# Patient Record
Sex: Female | Born: 1955 | Race: White | Hispanic: No | Marital: Single | Smoking: Former smoker
Health system: Southern US, Community
[De-identification: ages and names within clinical notes are randomized; demographics above are authoritative.]

## PROBLEM LIST (undated history)

## (undated) ENCOUNTER — Ambulatory Visit: Admission: EM | Payer: 59

## (undated) DIAGNOSIS — N6001 Solitary cyst of right breast: Secondary | ICD-10-CM

## (undated) DIAGNOSIS — J439 Emphysema, unspecified: Secondary | ICD-10-CM

## (undated) DIAGNOSIS — T4145XA Adverse effect of unspecified anesthetic, initial encounter: Secondary | ICD-10-CM

## (undated) DIAGNOSIS — Z923 Personal history of irradiation: Secondary | ICD-10-CM

## (undated) DIAGNOSIS — J45909 Unspecified asthma, uncomplicated: Secondary | ICD-10-CM

## (undated) DIAGNOSIS — I10 Essential (primary) hypertension: Secondary | ICD-10-CM

## (undated) DIAGNOSIS — K649 Unspecified hemorrhoids: Secondary | ICD-10-CM

## (undated) DIAGNOSIS — R519 Headache, unspecified: Secondary | ICD-10-CM

## (undated) DIAGNOSIS — N6002 Solitary cyst of left breast: Secondary | ICD-10-CM

## (undated) DIAGNOSIS — J449 Chronic obstructive pulmonary disease, unspecified: Secondary | ICD-10-CM

## (undated) DIAGNOSIS — T8859XA Other complications of anesthesia, initial encounter: Secondary | ICD-10-CM

## (undated) DIAGNOSIS — C801 Malignant (primary) neoplasm, unspecified: Secondary | ICD-10-CM

## (undated) DIAGNOSIS — M1990 Unspecified osteoarthritis, unspecified site: Secondary | ICD-10-CM

## (undated) DIAGNOSIS — K589 Irritable bowel syndrome without diarrhea: Secondary | ICD-10-CM

## (undated) DIAGNOSIS — K219 Gastro-esophageal reflux disease without esophagitis: Secondary | ICD-10-CM

## (undated) HISTORY — PX: APPENDECTOMY: SHX54

## (undated) HISTORY — PX: OTHER SURGICAL HISTORY: SHX169

## (undated) HISTORY — DX: Irritable bowel syndrome, unspecified: K58.9

## (undated) HISTORY — PX: KNEE SURGERY: SHX244

## (undated) HISTORY — DX: Unspecified hemorrhoids: K64.9

## (undated) HISTORY — DX: Solitary cyst of left breast: N60.02

## (undated) HISTORY — DX: Solitary cyst of right breast: N60.01

## (undated) HISTORY — DX: Malignant (primary) neoplasm, unspecified: C80.1

## (undated) HISTORY — PX: BREAST SURGERY: SHX581

## (undated) HISTORY — DX: Unspecified osteoarthritis, unspecified site: M19.90

---

## 1989-03-13 HISTORY — PX: OTHER SURGICAL HISTORY: SHX169

## 2004-02-22 ENCOUNTER — Inpatient Hospital Stay (HOSPITAL_COMMUNITY): Admission: AD | Admit: 2004-02-22 | Discharge: 2004-02-22 | Payer: Self-pay | Admitting: Obstetrics & Gynecology

## 2008-08-17 ENCOUNTER — Emergency Department: Payer: Self-pay

## 2010-01-27 ENCOUNTER — Emergency Department: Payer: Self-pay | Admitting: Internal Medicine

## 2010-03-10 ENCOUNTER — Ambulatory Visit: Payer: Self-pay

## 2010-04-30 ENCOUNTER — Ambulatory Visit: Payer: Self-pay | Admitting: Orthopedic Surgery

## 2011-08-27 ENCOUNTER — Emergency Department: Payer: Self-pay | Admitting: Unknown Physician Specialty

## 2012-08-07 ENCOUNTER — Emergency Department: Payer: Self-pay | Admitting: Emergency Medicine

## 2012-08-07 LAB — URINALYSIS, COMPLETE
Bilirubin,UR: NEGATIVE
Glucose,UR: NEGATIVE mg/dL (ref 0–75)
Leukocyte Esterase: NEGATIVE
Nitrite: NEGATIVE
Protein: NEGATIVE
RBC,UR: 1 /HPF (ref 0–5)
WBC UR: <1 /HPF (ref 0–5)

## 2012-08-07 LAB — COMPREHENSIVE METABOLIC PANEL
Alkaline Phosphatase: 114 U/L (ref 50–136)
Bilirubin,Total: 0.5 mg/dL (ref 0.2–1.0)
Calcium, Total: 7.9 mg/dL — ABNORMAL LOW (ref 8.5–10.1)
Chloride: 103 mmol/L (ref 98–107)
Co2: 31 mmol/L (ref 21–32)
Creatinine: 0.66 mg/dL (ref 0.60–1.30)
EGFR (African American): >60
EGFR (Non-African Amer.): >60
Osmolality: 273 (ref 275–301)
Potassium: 3.1 mmol/L — ABNORMAL LOW (ref 3.5–5.1)
SGOT(AST): 42 U/L — ABNORMAL HIGH (ref 15–37)
Sodium: 138 mmol/L (ref 136–145)

## 2012-08-07 LAB — CBC
MCHC: 34.6 g/dL (ref 32.0–36.0)
MCV: 91 fL (ref 80–100)
Platelet: 247 10*3/uL (ref 150–440)
RDW: 13.2 % (ref 11.5–14.5)
WBC: 6.3 10*3/uL (ref 3.6–11.0)

## 2012-08-07 LAB — TROPONIN I: Troponin-I: <0.02 ng/mL

## 2012-09-19 ENCOUNTER — Ambulatory Visit: Payer: Self-pay | Admitting: Family Medicine

## 2013-12-27 ENCOUNTER — Emergency Department: Payer: Self-pay | Admitting: Emergency Medicine

## 2014-03-02 ENCOUNTER — Emergency Department: Payer: Self-pay | Admitting: Emergency Medicine

## 2014-06-09 ENCOUNTER — Emergency Department: Payer: Self-pay | Admitting: Emergency Medicine

## 2014-06-09 LAB — TROPONIN I: Troponin-I: <0.02 ng/mL

## 2014-06-09 LAB — BASIC METABOLIC PANEL
Anion Gap: 7 (ref 7–16)
BUN: 4 mg/dL — AB (ref 7–18)
CALCIUM: 8.6 mg/dL (ref 8.5–10.1)
CHLORIDE: 105 mmol/L (ref 98–107)
Co2: 28 mmol/L (ref 21–32)
Creatinine: 0.77 mg/dL (ref 0.60–1.30)
Glucose: 85 mg/dL (ref 65–99)
Osmolality: 276 (ref 275–301)
Potassium: 3.3 mmol/L — ABNORMAL LOW (ref 3.5–5.1)
SODIUM: 140 mmol/L (ref 136–145)

## 2014-06-09 LAB — CBC
HCT: 43.6 % (ref 35.0–47.0)
HGB: 14.1 g/dL (ref 12.0–16.0)
MCH: 29.8 pg (ref 26.0–34.0)
MCHC: 32.4 g/dL (ref 32.0–36.0)
MCV: 92 fL (ref 80–100)
Platelet: 357 10*3/uL (ref 150–440)
RBC: 4.73 10*6/uL (ref 3.80–5.20)
RDW: 13.4 % (ref 11.5–14.5)
WBC: 8.3 10*3/uL (ref 3.6–11.0)

## 2014-06-19 ENCOUNTER — Emergency Department: Payer: Self-pay | Admitting: Emergency Medicine

## 2014-06-19 LAB — BASIC METABOLIC PANEL
ANION GAP: 8 (ref 7–16)
BUN: 4 mg/dL — ABNORMAL LOW (ref 7–18)
CHLORIDE: 104 mmol/L (ref 98–107)
CO2: 27 mmol/L (ref 21–32)
Calcium, Total: 8.2 mg/dL — ABNORMAL LOW (ref 8.5–10.1)
Creatinine: 0.73 mg/dL (ref 0.60–1.30)
Glucose: 99 mg/dL (ref 65–99)
Osmolality: 274 (ref 275–301)
Potassium: 3.1 mmol/L — ABNORMAL LOW (ref 3.5–5.1)
Sodium: 139 mmol/L (ref 136–145)

## 2014-06-19 LAB — CBC
HCT: 40.3 % (ref 35.0–47.0)
HGB: 13 g/dL (ref 12.0–16.0)
MCH: 29.7 pg (ref 26.0–34.0)
MCHC: 32.2 g/dL (ref 32.0–36.0)
MCV: 92 fL (ref 80–100)
PLATELETS: 342 10*3/uL (ref 150–440)
RBC: 4.37 10*6/uL (ref 3.80–5.20)
RDW: 13.3 % (ref 11.5–14.5)
WBC: 10 10*3/uL (ref 3.6–11.0)

## 2014-06-19 LAB — TROPONIN I: Troponin-I: <0.02 ng/mL

## 2014-09-13 DIAGNOSIS — Z923 Personal history of irradiation: Secondary | ICD-10-CM

## 2014-09-13 HISTORY — DX: Personal history of irradiation: Z92.3

## 2014-12-30 ENCOUNTER — Encounter: Payer: Self-pay | Admitting: General Surgery

## 2014-12-30 ENCOUNTER — Ambulatory Visit (INDEPENDENT_AMBULATORY_CARE_PROVIDER_SITE_OTHER): Payer: BLUE CROSS/BLUE SHIELD | Admitting: General Surgery

## 2014-12-30 VITALS — BP 130/78 | HR 76 | Resp 14 | Ht 67.0 in | Wt 191.0 lb

## 2014-12-30 DIAGNOSIS — N63 Unspecified lump in breast: Secondary | ICD-10-CM | POA: Diagnosis not present

## 2014-12-30 DIAGNOSIS — N6310 Unspecified lump in the right breast, unspecified quadrant: Secondary | ICD-10-CM

## 2014-12-30 NOTE — Progress Notes (Signed)
Patient ID: Meagan York, female   DOB: [birth year], 59 y.o.   MRN: [id number]  Chief Complaint  Patient presents with  . Other    right breast mass    HPI Meagan York is a 59 y.o. female here for evaluation of a right breast mass. She states the mass has been there since 1998. It has got bigger over the years. She had a left breast mass removed in 1998. Last mammogram 1998. No family history of breast cancer.  HPI  Past Medical History  Diagnosis Date  . Bilateral breast cysts   . Hemorrhoids   . Irritable bowel disease   . Arthritis     Past Surgical History  Procedure Laterality Date  . Appendectomy    . Knee surgery    . Cyst lower spine    . Breast surgery Left     cyst removal  . Piondial cyst excision  03/1989    Family History  Problem Relation Age of Onset  . Colon cancer Father     age 71    Social History History  Substance Use Topics  . Smoking status: Current Every Day Smoker -- 5.00 packs/day for 40 years    Types: Cigarettes  . Smokeless tobacco: Never Used  . Alcohol Use: No    Allergies  Allergen Reactions  . Tramadol Nausea And Vomiting    Current Outpatient Prescriptions  Medication Sig Dispense Refill  . albuterol (PROVENTIL HFA;VENTOLIN HFA) 108 (90 BASE) MCG/ACT inhaler Inhale into the lungs.     No current facility-administered medications for this visit.    Review of Systems Review of Systems  Constitutional: Negative.   Respiratory: Negative.   Cardiovascular: Negative.     Blood pressure 130/78, pulse 76, resp. rate 14, height 5\' 7"  (1.702 m), weight 191 lb (86.637 kg).  Physical Exam Physical Exam  Constitutional: She is oriented to person, place, and time. She appears well-developed and well-nourished.  Eyes: Conjunctivae are normal. No scleral icterus.  Neck: Neck supple.  Cardiovascular: Normal rate, regular rhythm and normal heart sounds.   Pulmonary/Chest: Effort normal and breath sounds normal. Right breast  exhibits mass (6-7 cm firm to hard smooth mass 8-9ocl lcation). Right breast exhibits no inverted nipple, no nipple discharge, no skin change and no tenderness. Left breast exhibits no inverted nipple, no mass, no nipple discharge, no skin change and no tenderness.    Solid mass noted on ultrasound in the outer right breast, approximate size is 4 x 4.   Abdominal: Soft. There is no hepatomegaly.  Lymphadenopathy:    She has no cervical adenopathy.    She has no axillary adenopathy.  Neurological: She is alert and oriented to person, place, and time.    Data Reviewed Dr. Allie Bossier notes  Assessment    Large right breast mass    Plan    Patient to return on Wednesday for a right breast core biopsy.     PCP: Dr [name] 12/31/2014, 6:44 AM

## 2014-12-31 ENCOUNTER — Encounter: Payer: Self-pay | Admitting: General Surgery

## 2015-01-01 ENCOUNTER — Encounter: Payer: Self-pay | Admitting: General Surgery

## 2015-01-01 ENCOUNTER — Ambulatory Visit (INDEPENDENT_AMBULATORY_CARE_PROVIDER_SITE_OTHER): Payer: BLUE CROSS/BLUE SHIELD | Admitting: General Surgery

## 2015-01-01 VITALS — BP 110/80 | HR 94 | Resp 16 | Ht 67.0 in | Wt 191.0 lb

## 2015-01-01 DIAGNOSIS — N63 Unspecified lump in breast: Secondary | ICD-10-CM

## 2015-01-01 DIAGNOSIS — N6310 Unspecified lump in the right breast, unspecified quadrant: Secondary | ICD-10-CM

## 2015-01-01 HISTORY — PX: BREAST BIOPSY: SHX20

## 2015-01-01 NOTE — Patient Instructions (Signed)

## 2015-01-01 NOTE — Progress Notes (Signed)
Patient ID: Meagan York, female   DOB: 1956/08/01, 59 y.o.   MRN: [id number]  Chief Complaint  Patient presents with  . Procedure    right breast core'    HPI Meagan York is a 59 y.o. female here today for a right breast core biopsy.  HPI  Past Medical History  Diagnosis Date  . Bilateral breast cysts   . Hemorrhoids   . Irritable bowel disease   . Arthritis     Past Surgical History  Procedure Laterality Date  . Appendectomy    . Knee surgery    . Cyst lower spine    . Breast surgery Left     cyst removal  . Piondial cyst excision  03/1989    Family History  Problem Relation Age of Onset  . Colon cancer Father     age 64    Social History History  Substance Use Topics  . Smoking status: Current Every Day Smoker -- 5.00 packs/day for 40 years    Types: Cigarettes  . Smokeless tobacco: Never Used  . Alcohol Use: No    Allergies  Allergen Reactions  . Tramadol Nausea And Vomiting    Current Outpatient Prescriptions  Medication Sig Dispense Refill  . albuterol (PROVENTIL HFA;VENTOLIN HFA) 108 (90 BASE) MCG/ACT inhaler Inhale into the lungs.     No current facility-administered medications for this visit.    Review of Systems Review of Systems  Constitutional: Negative.   Respiratory: Negative.   Cardiovascular: Negative.     Blood pressure 110/80, pulse 94, resp. rate 16, height 5\' 7"  (1.702 m), weight 191 lb (86.637 kg).  Physical Exam Physical Exam  Data Reviewed Right breast US targeted over palpable mass at 8 ocl 5cmfn was performed. There is a 4cm solid mass with a mixed echogenic pattern. Margins are smooth. Mild lobulatin in one end.  Core biopsy recommended. Also needs diagnostic mammogram.   BIRADS 4  Assessment    Large right breast mass-possible benign neoplasm.      Plan    Core biopsy completed  CORE BREAST BIOPSY REPORT  Name:  Meagan York DOB:  1956-05-03  Vital signs:BP 110/80 mmHg  Pulse 94  Resp 16   Ht 5\' 7"  (1.702 m)  Wt 191 lb (86.637 kg)  BMI 29.91 kg/m2  Lesion:  mass  Location:   Right      8o'clock position  Local anesthetic:   8 ml of 1% Xylocaine/0.5% Marcaine  Prep:  Chloro Prep  Device:  14 Gauge   Bard   Ultrasound guidance:  used  Patient tolerance:  Patient tolerated the procedure well   Approach: cc  Clip: not placed  Dressing: telfa, tegderm  Ice pack applied. Written instructions provided to patient regarding wound care.   Patient advised that patient will be contacted by phone when pathology report is available.  Followup appointment to be scheduled after pathology.   CC: No PCP Per Patient, [name]      Jaculin Rasmus G 01/01/2015, 9:30 AM

## 2015-01-02 ENCOUNTER — Telehealth: Payer: Self-pay | Admitting: *Deleted

## 2015-01-02 NOTE — Telephone Encounter (Signed)
Patient scheduled for bilateral diagnostic mammogram at [facility] for 01-03-15 at 3 pm.  Office appointment scheduled for 01-13-15.

## 2015-01-02 NOTE — Telephone Encounter (Signed)
-----   Message from Christene Lye, MD sent at 01/02/2015  2:48 PM EDT ----- Path showed a fibroepithelial lesion, with some ADH. Needs full excision but not before a diagnostic mammogram. Pt advised by phone. She is agreeable to plan.

## 2015-01-03 ENCOUNTER — Other Ambulatory Visit: Payer: BLUE CROSS/BLUE SHIELD

## 2015-01-03 ENCOUNTER — Other Ambulatory Visit: Payer: Self-pay | Admitting: General Surgery

## 2015-01-03 DIAGNOSIS — N6310 Unspecified lump in the right breast, unspecified quadrant: Secondary | ICD-10-CM

## 2015-01-08 ENCOUNTER — Encounter: Payer: Self-pay | Admitting: General Surgery

## 2015-01-13 ENCOUNTER — Ambulatory Visit: Payer: BLUE CROSS/BLUE SHIELD | Admitting: General Surgery

## 2015-01-21 ENCOUNTER — Ambulatory Visit: Payer: BLUE CROSS/BLUE SHIELD | Admitting: General Surgery

## 2015-01-28 ENCOUNTER — Telehealth: Payer: Self-pay | Admitting: *Deleted

## 2015-01-28 NOTE — Telephone Encounter (Deleted)
-----   Message from Christene Lye, MD sent at 01/28/2015  7:03 AM EDT ----- Please let pt pt know the pathology was normal.One of the three was a true polyp, no cancer in it. He will need colonoscopy in 5 yrs

## 2015-01-28 NOTE — Telephone Encounter (Signed)
Patient was calling you back and the message in there about the pathology results are not [name] pathology, She has never had a colonoscopy.

## 2015-02-06 ENCOUNTER — Ambulatory Visit: Payer: BLUE CROSS/BLUE SHIELD | Admitting: General Surgery

## 2015-04-10 ENCOUNTER — Ambulatory Visit (INDEPENDENT_AMBULATORY_CARE_PROVIDER_SITE_OTHER): Payer: Managed Care, Other (non HMO) | Admitting: General Surgery

## 2015-04-10 ENCOUNTER — Encounter: Payer: Self-pay | Admitting: General Surgery

## 2015-04-10 ENCOUNTER — Other Ambulatory Visit: Payer: Self-pay

## 2015-04-10 VITALS — BP 132/88 | HR 82 | Resp 14 | Ht 65.0 in | Wt 188.0 lb

## 2015-04-10 DIAGNOSIS — N63 Unspecified lump in breast: Secondary | ICD-10-CM

## 2015-04-10 DIAGNOSIS — D241 Benign neoplasm of right breast: Secondary | ICD-10-CM | POA: Diagnosis not present

## 2015-04-10 DIAGNOSIS — N6310 Unspecified lump in the right breast, unspecified quadrant: Secondary | ICD-10-CM

## 2015-04-10 DIAGNOSIS — N62 Hypertrophy of breast: Secondary | ICD-10-CM | POA: Diagnosis not present

## 2015-04-10 DIAGNOSIS — N6091 Unspecified benign mammary dysplasia of right breast: Secondary | ICD-10-CM

## 2015-04-10 NOTE — Progress Notes (Signed)
Patient ID: Meagan York, female   DOB: [birth year], 59 y.o.   MRN: [id number]  Chief Complaint  Patient presents with  . Follow-up    mammogram    HPI Meagan York is a 59 y.o. female.  who presents for a breast evaluation. The most recent mammogram was done on 01-03-15.  Patient does perform regular self breast checks and gets regular mammograms done.   No family history of breast cancer. She had a right breast biopsy in April and she is here to discuss excision of the right breast mass, she now has insurance. 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   HPI  Past Medical History  Diagnosis Date  . Bilateral breast cysts   . Hemorrhoids   . Irritable bowel disease   . Arthritis     Past Surgical History  Procedure Laterality Date  . Appendectomy    . Knee surgery    . Cyst lower spine    . Piondial cyst excision  03/1989  . Breast surgery Left     cyst removal  . Breast biopsy Right 01-01-15    BIPHASIC FIBROEPITHELIAL LESION with ADH    Family History  Problem Relation Age of Onset  . Colon cancer Father     age 59    Social History History  Substance Use Topics  . Smoking status: Current Every Day Smoker -- 5.00 packs/day for 40 years    Types: Cigarettes  . Smokeless tobacco: Never Used  . Alcohol Use: No    Allergies  Allergen Reactions  . Tramadol Nausea And Vomiting    Current Outpatient Prescriptions  Medication Sig Dispense Refill  . albuterol (PROVENTIL HFA;VENTOLIN HFA) 108 (90 BASE) MCG/ACT inhaler Inhale into the lungs every 4 (four) hours as needed.      No current facility-administered medications for this visit.    Review of Systems Review of Systems  Constitutional: Negative.   Respiratory: Negative.   Cardiovascular: Negative.   Neurological: Positive for headaches.    Blood pressure 132/88, pulse 82, resp. rate 14, height 5\' 5"  (1.651 m), weight 188 lb (85.276 kg).  Physical Exam Physical Exam  Constitutional: She  is oriented to person, place, and time. She appears well-developed and well-nourished.  HENT:  Mouth/Throat: Oropharynx is clear and moist.  Eyes: Conjunctivae are normal. No scleral icterus.  Neck: Neck supple.  Cardiovascular: Normal rate, regular rhythm and normal heart sounds.   Pulmonary/Chest: Effort normal and breath sounds normal. Right breast exhibits mass. Right breast exhibits no inverted nipple, no nipple discharge, no skin change and no tenderness. Left breast exhibits no inverted nipple, no mass, no nipple discharge, no skin change and no tenderness.    6 cm firm mass right breast 8-9 o clock.   Abdominal: Soft. Bowel sounds are normal. There is no tenderness.  Lymphadenopathy:    She has no cervical adenopathy.    She has no axillary adenopathy.  Neurological: She is alert and oriented to person, place, and time.  Skin: Skin is warm and dry.  Psychiatric: She has a normal mood and affect.    Data Reviewed Mammogram reviewed and showed sub centimeter benign appearing nodules in medial right breast in addition to the large mass in the lateral aspect.  [place] of right breast was repeated. There is an approximate 5 mm benign appearing nodule 2 oclock right breast. The lateral right breast was hard to visualize because of the large mass present there. The right axilla has a  benign appearing lymph node.   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 This requires complete excision in the form of lumpectomy. Discussed fully with patient.      Plan    Schedule lumpectomy. Procedure explained in full. Patient is agreeable.  Patient's surgery has been scheduled for 04-25-15 at [facility].      PCP:  No Pcp   Janee Ureste G 04/10/2015, 2:02 PM

## 2015-04-10 NOTE — Patient Instructions (Addendum)
The patient is aware to call back for any questions or concerns.  Patient's surgery has been scheduled for 04-25-15 at [facility].

## 2015-04-18 ENCOUNTER — Inpatient Hospital Stay: Admission: RE | Admit: 2015-04-18 | Payer: Self-pay | Source: Ambulatory Visit

## 2015-04-18 ENCOUNTER — Encounter: Payer: Self-pay | Admitting: *Deleted

## 2015-04-18 NOTE — Patient Instructions (Signed)
  Your procedure is scheduled on: 04-25-15 Report to [facility] To find out your arrival time please call [phone] between 1PM - 3PM on 04-24-15  Remember: Instructions that are not followed completely may result in serious medical risk, up to and including death, or upon the discretion of your surgeon and anesthesiologist your surgery may need to be rescheduled.    __X__ 1. Do not eat food or drink liquids after midnight. No gum chewing or hard candies.     __X__ 2. No Alcohol for 24 hours before or after surgery.   ____ 3. Bring all medications with you on the day of surgery if instructed.    ____ 4. Notify your doctor if there is any change in your medical condition     (cold, fever, infections).     Do not wear jewelry, make-up, hairpins, clips or nail polish.  Do not wear lotions, powders, or perfumes. You may wear deodorant.  Do not shave 48 hours prior to surgery. Men may shave face and neck.  Do not bring valuables to the hospital.    [facility] is not responsible for any belongings or valuables.               Contacts, dentures or bridgework may not be worn into surgery.  Leave your suitcase in the car. After surgery it may be brought to your room.  For patients admitted to the hospital, discharge time is determined by your  treatment team.   Patients discharged the day of surgery will not be allowed to drive home.   Please read over the following fact sheets that you were given:     __X__ Take these medicines the morning of surgery with A SIP OF WATER:    1. ZANTAC  2. TAKE AN EXTRA ZANTAC Thursday NIGHT  3.   4.  5.  6.  ____ Fleet Enema (as directed)   ____ Use CHG Soap as directed  _X___ Use inhalers on the day of surgery-USE ALBUTEROL AND [place]  ____ Stop metformin 2 days prior to surgery    ____ Take 1/2 of usual insulin dose the night before surgery and none on the morning of surgery.   ____ Stop  Coumadin/Plavix/aspirin-N/A  ____ Stop Anti-inflammatories-NO NSAIDS OR ASA PRODUCTS-TYLENOL OK   ____ Stop supplements until after surgery.    ____ Bring C-Pap to the hospital.

## 2015-04-25 ENCOUNTER — Ambulatory Visit: Payer: Managed Care, Other (non HMO) | Admitting: Anesthesiology

## 2015-04-25 ENCOUNTER — Ambulatory Visit
Admission: RE | Admit: 2015-04-25 | Discharge: 2015-04-25 | Disposition: A | Discharge status: Home / Self Care | Payer: Managed Care, Other (non HMO) | Source: Ambulatory Visit | Attending: General Surgery | Admitting: General Surgery

## 2015-04-25 ENCOUNTER — Encounter: Payer: Self-pay | Admitting: *Deleted

## 2015-04-25 ENCOUNTER — Encounter
Admission: RE | Disposition: A | Discharge status: Home / Self Care | Payer: Self-pay | Source: Ambulatory Visit | Attending: General Surgery

## 2015-04-25 DIAGNOSIS — F17210 Nicotine dependence, cigarettes, uncomplicated: Secondary | ICD-10-CM | POA: Insufficient documentation

## 2015-04-25 DIAGNOSIS — M1990 Unspecified osteoarthritis, unspecified site: Secondary | ICD-10-CM | POA: Diagnosis not present

## 2015-04-25 DIAGNOSIS — N6091 Unspecified benign mammary dysplasia of right breast: Secondary | ICD-10-CM

## 2015-04-25 DIAGNOSIS — D241 Benign neoplasm of right breast: Secondary | ICD-10-CM | POA: Diagnosis present

## 2015-04-25 DIAGNOSIS — K589 Irritable bowel syndrome without diarrhea: Secondary | ICD-10-CM | POA: Diagnosis not present

## 2015-04-25 DIAGNOSIS — Z800 Family history of malignant neoplasm of digestive organs: Secondary | ICD-10-CM | POA: Insufficient documentation

## 2015-04-25 DIAGNOSIS — Z853 Personal history of malignant neoplasm of breast: Secondary | ICD-10-CM | POA: Insufficient documentation

## 2015-04-25 DIAGNOSIS — L918 Other hypertrophic disorders of the skin: Secondary | ICD-10-CM | POA: Insufficient documentation

## 2015-04-25 DIAGNOSIS — Z9049 Acquired absence of other specified parts of digestive tract: Secondary | ICD-10-CM | POA: Insufficient documentation

## 2015-04-25 DIAGNOSIS — D0511 Intraductal carcinoma in situ of right breast: Secondary | ICD-10-CM | POA: Diagnosis not present

## 2015-04-25 DIAGNOSIS — Z9889 Other specified postprocedural states: Secondary | ICD-10-CM | POA: Insufficient documentation

## 2015-04-25 DIAGNOSIS — C801 Malignant (primary) neoplasm, unspecified: Secondary | ICD-10-CM | POA: Insufficient documentation

## 2015-04-25 DIAGNOSIS — D4861 Neoplasm of uncertain behavior of right breast: Secondary | ICD-10-CM | POA: Diagnosis not present

## 2015-04-25 HISTORY — DX: Adverse effect of unspecified anesthetic, initial encounter: T41.45XA

## 2015-04-25 HISTORY — DX: Gastro-esophageal reflux disease without esophagitis: K21.9

## 2015-04-25 HISTORY — PX: BREAST LUMPECTOMY: SHX2

## 2015-04-25 HISTORY — DX: Chronic obstructive pulmonary disease, unspecified: J44.9

## 2015-04-25 HISTORY — DX: Other complications of anesthesia, initial encounter: T88.59XA

## 2015-04-25 HISTORY — DX: Malignant (primary) neoplasm, unspecified: C80.1

## 2015-04-25 SURGERY — BREAST LUMPECTOMY
Anesthesia: General | Laterality: Right | Wound class: Clean

## 2015-04-25 MED ORDER — LACTATED RINGERS IV SOLN
INTRAVENOUS | Status: DC
  Administered 2015-04-25 (×2): via INTRAVENOUS

## 2015-04-25 MED ORDER — ONDANSETRON HCL 4 MG/2ML IJ SOLN
INTRAMUSCULAR | Status: DC | PRN
  Administered 2015-04-25: 4 mg via INTRAVENOUS

## 2015-04-25 MED ORDER — FENTANYL CITRATE (PF) 100 MCG/2ML IJ SOLN
INTRAMUSCULAR | Status: AC
  Filled 2015-04-25: qty 2

## 2015-04-25 MED ORDER — CHLORHEXIDINE GLUCONATE 4 % EX LIQD: 1.0000 "application " | Freq: Once | CUTANEOUS | Status: DC

## 2015-04-25 MED ORDER — LIDOCAINE HCL (CARDIAC) 20 MG/ML IV SOLN
INTRAVENOUS | Status: DC | PRN
  Administered 2015-04-25: 100 mg via INTRAVENOUS

## 2015-04-25 MED ORDER — BUPIVACAINE HCL (PF) 0.5 % IJ SOLN
INTRAMUSCULAR | Status: DC | PRN
Start: 2015-04-25 — End: 2015-04-25
  Administered 2015-04-25: 4 mL

## 2015-04-25 MED ORDER — BACITRACIN ZINC 500 UNIT/GM EX OINT
TOPICAL_OINTMENT | CUTANEOUS | Status: AC
  Filled 2015-04-25: qty 0.9

## 2015-04-25 MED ORDER — HYDROMORPHONE HCL 1 MG/ML IJ SOLN
INTRAMUSCULAR | Status: DC | PRN
  Administered 2015-04-25 (×2): 0.5 mg via INTRAVENOUS

## 2015-04-25 MED ORDER — ACETAMINOPHEN 10 MG/ML IV SOLN
INTRAVENOUS | Status: DC | PRN
  Administered 2015-04-25: 1000 mg via INTRAVENOUS

## 2015-04-25 MED ORDER — ONDANSETRON HCL 4 MG/2ML IJ SOLN: 4.0000 mg | Freq: Once | INTRAMUSCULAR | Status: DC | PRN

## 2015-04-25 MED ORDER — BUPIVACAINE HCL (PF) 0.5 % IJ SOLN
INTRAMUSCULAR | Status: AC
  Filled 2015-04-25: qty 30

## 2015-04-25 MED ORDER — FENTANYL CITRATE (PF) 100 MCG/2ML IJ SOLN
25.0000 ug | INTRAMUSCULAR | Status: DC | PRN
  Administered 2015-04-25 (×4): 25 ug via INTRAVENOUS

## 2015-04-25 MED ORDER — FENTANYL CITRATE (PF) 100 MCG/2ML IJ SOLN
INTRAMUSCULAR | Status: DC | PRN
  Administered 2015-04-25 (×2): 50 ug via INTRAVENOUS

## 2015-04-25 MED ORDER — DEXAMETHASONE SODIUM PHOSPHATE 4 MG/ML IJ SOLN
INTRAMUSCULAR | Status: DC | PRN
  Administered 2015-04-25: 30 mg via INTRAVENOUS
  Administered 2015-04-25: 4 mg via INTRAVENOUS

## 2015-04-25 MED ORDER — ACETAMINOPHEN 10 MG/ML IV SOLN
INTRAVENOUS | Status: AC
  Filled 2015-04-25: qty 100

## 2015-04-25 MED ORDER — OXYCODONE-ACETAMINOPHEN 5-325 MG PO TABS
ORAL_TABLET | ORAL | Status: AC
  Filled 2015-04-25: qty 1

## 2015-04-25 MED ORDER — OXYCODONE-ACETAMINOPHEN 5-325 MG PO TABS: 1.0000 | ORAL_TABLET | ORAL | Status: DC | PRN

## 2015-04-25 MED ORDER — PROPOFOL 10 MG/ML IV BOLUS
INTRAVENOUS | Status: DC | PRN
  Administered 2015-04-25: 150 mg via INTRAVENOUS

## 2015-04-25 MED ORDER — MIDAZOLAM HCL 2 MG/2ML IJ SOLN
INTRAMUSCULAR | Status: DC | PRN
  Administered 2015-04-25: 2 mg via INTRAVENOUS

## 2015-04-25 MED ORDER — OXYCODONE-ACETAMINOPHEN 5-325 MG PO TABS
2.0000 | ORAL_TABLET | ORAL | Status: DC | PRN
  Administered 2015-04-25: 1 via ORAL

## 2015-04-25 SURGICAL SUPPLY — 37 items
BLADE SURG 15 STRL SS SAFETY (BLADE) ×3 IMPLANT
BULB RESERV EVAC DRAIN JP 100C (MISCELLANEOUS) ×1 IMPLANT
CANISTER SUCT 1200ML W/VALVE (MISCELLANEOUS) ×2 IMPLANT
CHLORAPREP W/TINT 26ML (MISCELLANEOUS) ×2 IMPLANT
CNTNR SPEC 2.5X3XGRAD LEK (MISCELLANEOUS) ×1
CONT SPEC 4OZ STER OR WHT (MISCELLANEOUS) ×1
CONT SPEC 4OZ STRL OR WHT (MISCELLANEOUS) ×1
CONTAINER SPEC 2.5X3XGRAD LEK (MISCELLANEOUS) ×4 IMPLANT
COVER PROBE FLX POLY STRL (MISCELLANEOUS) ×2 IMPLANT
DEVICE LOCALIZATION ULTRAWIRE (WIRE) ×1 IMPLANT
DRAIN CHANNEL JP 15F RND 16 (MISCELLANEOUS) ×1 IMPLANT
DRAPE LAPAROTOMY TRNSV 106X77 (MISCELLANEOUS) ×2 IMPLANT
GLOVE BIO SURGEON STRL SZ7 (GLOVE) ×4 IMPLANT
GOWN STRL REUS W/ TWL LRG LVL3 (GOWN DISPOSABLE) ×3 IMPLANT
GOWN STRL REUS W/TWL LRG LVL3 (GOWN DISPOSABLE) ×4
HARMONIC SCALPEL FOCUS (MISCELLANEOUS) ×1 IMPLANT
KIT RM TURNOVER STRD PROC AR (KITS) ×2 IMPLANT
LABEL OR SOLS (LABEL) ×2 IMPLANT
LIQUID BAND (GAUZE/BANDAGES/DRESSINGS) ×2 IMPLANT
MARGIN MAP 10MM (MISCELLANEOUS) ×2 IMPLANT
NDL SAFETY 22GX1.5 (NEEDLE) ×2 IMPLANT
NDL SAFETY 25GX1.5 (NEEDLE) ×1 IMPLANT
PACK BASIN MINOR ARMC (MISCELLANEOUS) ×2 IMPLANT
PAD GROUND ADULT SPLIT (MISCELLANEOUS) ×2 IMPLANT
SLEVE PROBE SENORX GAMMA FIND (MISCELLANEOUS) ×1 IMPLANT
STRIP CLOSURE SKIN 1/2X4 (GAUZE/BANDAGES/DRESSINGS) ×1 IMPLANT
SUT ETH BLK MONO 3 0 FS 1 12/B (SUTURE) ×3 IMPLANT
SUT MNCRL 4-0 (SUTURE) ×2
SUT MNCRL 4-0 27XMFL (SUTURE) ×1
SUT MNCRL AB 3-0 PS2 27 (SUTURE) ×1 IMPLANT
SUT VIC AB 2-0 BRD 54 (SUTURE) ×1 IMPLANT
SUT VIC AB 2-0 CT2 27 (SUTURE) ×4 IMPLANT
SUT VIC AB 4-0 FS2 27 (SUTURE) ×1 IMPLANT
SUTURE MNCRL 4-0 27XMF (SUTURE) IMPLANT
SYRINGE 10CC LL (SYRINGE) ×2 IMPLANT
ULTRAWIRE LOCALIZATION DEVICE (WIRE)
WATER STERILE IRR 1000ML POUR (IV SOLUTION) ×2 IMPLANT

## 2015-04-25 NOTE — Anesthesia Postprocedure Evaluation (Signed)
  Anesthesia Post-op Note  Patient: Meagan York  Procedure(s) Performed: Procedure(s): RIGHT BREAST LUMPECTOMY, exicision of skin tag right face (Right)  Anesthesia type:General  Patient location: PACU  Post pain: Pain level controlled  Post assessment: Post-op Vital signs reviewed, Patient's Cardiovascular Status Stable, Respiratory Function Stable, Patent Airway and No signs of Nausea or vomiting  Post vital signs: Reviewed and stable  Last Vitals:  Filed Vitals:   04/25/15 0832  BP: 117/85  Pulse: 91  Temp: 36.3 C  Resp: 23    Level of consciousness: awake, alert  and patient cooperative  Complications: No apparent anesthesia complications

## 2015-04-25 NOTE — Interval H&P Note (Signed)
History and Physical Interval Note:  04/25/2015 7:07 AM  Meagan York  has presented today for surgery, with the diagnosis of BENIGN NEOPLASM R BREAST  The various methods of treatment have been discussed with the patient and family. After consideration of risks, benefits and other options for treatment, the patient has consented to  Procedure(s): BREAST LUMPECTOMY (Right) as a surgical intervention .  The patient's history has been reviewed, patient examined, no change in status, stable for surgery.  I have reviewed the patient's chart and labs.  Questions were answered to the patient's satisfaction.   She also requested that a skin tag on right temple be removed as it gets irritated by her glasses. This is acceptable and this is added to her consent.  SANKAR,SEEPLAPUTHUR G

## 2015-04-25 NOTE — Discharge Instructions (Signed)
AMBULATORY SURGERY  DISCHARGE INSTRUCTIONS   1) The drugs that you were given will stay in your system until tomorrow so for the next 24 hours you should not:  A) Drive an automobile B) Make any legal decisions C) Drink any alcoholic beverage   2) You may resume regular meals tomorrow.  Today it is better to start with liquids and gradually work up to solid foods.  You may eat anything you prefer, but it is better to start with liquids, then soup and crackers, and gradually work up to solid foods.   3) Please notify your doctor immediately if you have any unusual bleeding, trouble breathing, redness and pain at the surgery site, drainage, fever, or pain not relieved by medication.    4) Additional Instructions:    Breast Biopsy  Care After Refer to this sheet in the next few weeks. These instructions provide you with information on caring for yourself after your procedure. Your caregiver may also give you more specific instructions. Your treatment has been planned according to current medical practices, but problems sometimes occur. Call your caregiver if you have any problems or questions after your procedure. HOME CARE INSTRUCTIONS   Only take over-the-counter or prescription medicines for pain, discomfort, or fever as directed by your caregiver.  Do not take aspirin. It can cause bleeding.  Keep stitches dry when bathing.  Protect the biopsy area. Do not let the area get bumped.  Avoid activities that may pull the incision site open until approved by your caregiver. This can include stretching, reaching, exercise, sports, or lifting over 3 pounds.  Resume your usual diet.  Wear a good support bra for as long as directed by your caregiver.  Change any bandages (dressings) as directed by your caregiver.  Do not drink alcohol while taking pain medicine.  Keep all your follow-up appointments with your caregiver. Ask when your test results will be ready. Make sure you get  your test results. SEEK MEDICAL CARE IF:   You have redness, swelling, or increasing pain in the biopsy site.  You have a bad smell coming from the biopsy site or dressing.  Your biopsy site breaks open after the stitches (sutures), staples, or skin adhesive strips have been removed.  You have a rash.  You need stronger medicine. SEEK IMMEDIATE MEDICAL CARE IF:   You have a fever.  You have increased bleeding (more than a small spot) from the biopsy site.  You have difficulty breathing.  You have pus coming from the biopsy site. MAKE SURE YOU:  Understand these instructions.  Will watch your condition.  Will get help right away if you are not doing well or get worse. Document Released: 03/19/2005 Document Revised: 11/22/2011 Document Reviewed: 09/30/2011 [facility] Patient Information 2015 [place], [place]. This information is not intended to replace advice given to you by your health care provider. Make sure you discuss any questions you have with your health care provider.     Please contact your physician with any problems or Same Day Surgery at [phone], Monday through Friday 6 am to 4 pm, or [place] [facility] number at [phone].

## 2015-04-25 NOTE — H&P (View-Only) (Signed)
Patient ID: Meagan York, female   DOB: [birth year], 59 y.o.   MRN: [id number]  Chief Complaint  Patient presents with  . Follow-up    mammogram    HPI Meagan York is a 58 y.o. female.  who presents for a breast evaluation. The most recent mammogram was done on 01-03-15.  Patient does perform regular self breast checks and gets regular mammograms done.   No family history of breast cancer. She had a right breast biopsy in April and she is here to discuss excision of the right breast mass, she now has insurance. 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   HPI  Past Medical History  Diagnosis Date  . Bilateral breast cysts   . Hemorrhoids   . Irritable bowel disease   . Arthritis     Past Surgical History  Procedure Laterality Date  . Appendectomy    . Knee surgery    . Cyst lower spine    . Piondial cyst excision  03/1989  . Breast surgery Left     cyst removal  . Breast biopsy Right 01-01-15    BIPHASIC FIBROEPITHELIAL LESION with ADH    Family History  Problem Relation Age of Onset  . Colon cancer Father     age 62    Social History History  Substance Use Topics  . Smoking status: Current Every Day Smoker -- 5.00 packs/day for 40 years    Types: Cigarettes  . Smokeless tobacco: Never Used  . Alcohol Use: No    Allergies  Allergen Reactions  . Tramadol Nausea And Vomiting    Current Outpatient Prescriptions  Medication Sig Dispense Refill  . albuterol (PROVENTIL HFA;VENTOLIN HFA) 108 (90 BASE) MCG/ACT inhaler Inhale into the lungs every 4 (four) hours as needed.      No current facility-administered medications for this visit.    Review of Systems Review of Systems  Constitutional: Negative.   Respiratory: Negative.   Cardiovascular: Negative.   Neurological: Positive for headaches.    Blood pressure 132/88, pulse 82, resp. rate 14, height 5\' 5"  (1.651 m), weight 188 lb (85.276 kg).  Physical Exam Physical Exam  Constitutional: She  is oriented to person, place, and time. She appears well-developed and well-nourished.  HENT:  Mouth/Throat: Oropharynx is clear and moist.  Eyes: Conjunctivae are normal. No scleral icterus.  Neck: Neck supple.  Cardiovascular: Normal rate, regular rhythm and normal heart sounds.   Pulmonary/Chest: Effort normal and breath sounds normal. Right breast exhibits mass. Right breast exhibits no inverted nipple, no nipple discharge, no skin change and no tenderness. Left breast exhibits no inverted nipple, no mass, no nipple discharge, no skin change and no tenderness.    6 cm firm mass right breast 8-9 o clock.   Abdominal: Soft. Bowel sounds are normal. There is no tenderness.  Lymphadenopathy:    She has no cervical adenopathy.    She has no axillary adenopathy.  Neurological: She is alert and oriented to person, place, and time.  Skin: Skin is warm and dry.  Psychiatric: She has a normal mood and affect.    Data Reviewed Mammogram reviewed and showed sub centimeter benign appearing nodules in medial right breast in addition to the large mass in the lateral aspect.  [place] of right breast was repeated. There is an approximate 5 mm benign appearing nodule 2 oclock right breast. The lateral right breast was hard to visualize because of the large mass present there. The right axilla has a  benign appearing lymph node.   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 This requires complete excision in the form of lumpectomy. Discussed fully with patient.      Plan    Schedule lumpectomy. Procedure explained in full. Patient is agreeable.  Patient's surgery has been scheduled for 04-25-15 at [facility].      PCP:  No Pcp   Gennie Dib G 04/10/2015, 2:02 PM

## 2015-04-25 NOTE — Anesthesia Procedure Notes (Signed)
Procedure Name: LMA Insertion Date/Time: 04/25/2015 7:22 AM Performed by: Justus Memory Pre-anesthesia Checklist: Patient identified, Emergency Drugs available, Suction available and Patient being monitored Patient Re-evaluated:Patient Re-evaluated prior to inductionOxygen Delivery Method: Circle system utilized Preoxygenation: Pre-oxygenation with 100% oxygen Intubation Type: IV induction Ventilation: Mask ventilation without difficulty LMA: LMA inserted LMA Size: 3.5 Number of attempts: 1

## 2015-04-25 NOTE — Op Note (Signed)
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 Removal skin tag right temple  Surgeon: S.G.Edrian Melucci  Assistant:     Anesthesia: Gen.  Complications: None  EBL: Minimal  Drains: None  Description: Patient was put to sleep in the supine position the operating table. The right breast was prepped and draped sterile field. Timeout was performed. Patient had a large palpable mass at the 8:00 location right breast. Previous core biopsy had revealed a fibroepithelial neoplasm with evidence of ADH. Curvilinear incision was made along the 8:00 location. The skin and subcutaneous tissue with an elevated on both sides to allow for adequate exposure of this large mass. With careful traction and exposure the mass was circumferentially freed with cautery and subsequently excised out with a rim of normal tissue especially at the base and on the sides. The mass measured 9 x 7 x 5 cm. This was tagged for margins and sent to pathology in formalin. Palpation of the glandular tissue showed no other palpable findings. After ensuring hemostasis the deep tissues were closed in 2 layers with 2-0 Vicryl. Skin was closed with subcuticular 4-0 Monocryl. Liqui  ban was applied. The skin tag area in the right temple was prepped with Betadine and skin tag was excised out flush with the skin and cauterized. This was dressed with Neosporin ointment and a Band-Aid. Patient subsequently was extubated and returned recovery room stable condition

## 2015-04-25 NOTE — Transfer of Care (Signed)
Immediate Anesthesia Transfer of Care Note  Patient: Meagan York  Procedure(s) Performed: Procedure(s): RIGHT BREAST LUMPECTOMY, exicision of skin tag right face (Right)  Patient Location: PACU  Anesthesia Type:General  Level of Consciousness: awake, alert  and oriented  Airway & Oxygen Therapy: Patient Spontanous Breathing and Patient connected to face mask oxygen  Post-op Assessment: Report given to RN and Post -op Vital signs reviewed and stable  Post vital signs: Reviewed and stable  Last Vitals:  Filed Vitals:   04/25/15 0604  BP: 121/85  Pulse: 95  Temp: 36.6 C  Resp: 16    Complications: No apparent anesthesia complications

## 2015-04-25 NOTE — Anesthesia Preprocedure Evaluation (Signed)
Anesthesia Evaluation  Patient identified by MRN, date of birth, ID band Patient awake    Reviewed: Allergy & Precautions, NPO status , Patient's Chart, lab work & pertinent test results  History of Anesthesia Complications Negative for: history of anesthetic complications (Awareness under anesthesia with breast bx)  Airway Mallampati: II  TM Distance: >3 FB Neck ROM: Full    Dental  (+) Teeth Intact   Pulmonary COPD (no inhalers for several months) COPD inhaler, Current Smoker (1ppd),          Cardiovascular     Neuro/Psych    GI/Hepatic GERD-  Medicated and Controlled,  Endo/Other    Renal/GU Renal disease (recurrent UTIs)     Musculoskeletal  (+) Arthritis -, Osteoarthritis,    Abdominal   Peds  Hematology   Anesthesia Other Findings   Reproductive/Obstetrics                             Anesthesia Physical Anesthesia Plan  ASA: II  Anesthesia Plan: General   Post-op Pain Management:    Induction: Intravenous  Airway Management Planned: LMA  Additional Equipment:   Intra-op Plan:   Post-operative Plan:   Informed Consent: I have reviewed the patients History and Physical, chart, labs and discussed the procedure including the risks, benefits and alternatives for the proposed anesthesia with the patient or authorized representative who has indicated his/her understanding and acceptance.     Plan Discussed with:   Anesthesia Plan Comments:         Anesthesia Quick Evaluation

## 2015-05-07 LAB — SURGICAL PATHOLOGY

## 2015-05-08 ENCOUNTER — Encounter: Payer: Self-pay | Admitting: General Surgery

## 2015-05-08 ENCOUNTER — Ambulatory Visit (INDEPENDENT_AMBULATORY_CARE_PROVIDER_SITE_OTHER): Payer: Managed Care, Other (non HMO) | Admitting: General Surgery

## 2015-05-08 VITALS — BP 116/80 | HR 82 | Resp 14 | Ht 65.0 in | Wt 181.0 lb

## 2015-05-08 DIAGNOSIS — D241 Benign neoplasm of right breast: Secondary | ICD-10-CM

## 2015-05-08 DIAGNOSIS — D0511 Intraductal carcinoma in situ of right breast: Secondary | ICD-10-CM

## 2015-05-08 NOTE — Progress Notes (Signed)
Here today for her postoperative visit, right lumpectomy on 04-25-15. She states the area is a little sore after working all day but otherwise she is doing good. 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 Discussed pathology with pt.Feel she needs radiation followed by antihormonal therapy. Pt agreeable. She may be a candidate for [facility] appointment. Patient has been scheduled for an appointment with Dr. Baruch Gouty for 05-12-15 at 9 am. This patient is aware of date, time, and instructions.   PCP:  No Pcp

## 2015-05-08 NOTE — Patient Instructions (Addendum)
The patient is aware to call back for any questions or concerns.  Radiation Oncology appointment.  Patient has been scheduled for an appointment with Dr. Baruch Gouty for 05-12-15 at 9 am.

## 2015-05-12 ENCOUNTER — Ambulatory Visit: Payer: Managed Care, Other (non HMO) | Admitting: Radiation Oncology

## 2015-05-13 ENCOUNTER — Telehealth: Payer: Self-pay | Admitting: *Deleted

## 2015-05-13 NOTE — Telephone Encounter (Signed)
Pt needs morning appointments she has to be at work by 3 pm in [place].

## 2015-05-13 NOTE — Telephone Encounter (Signed)
Patient called again wanting to talk to you when you get a chance.

## 2015-05-13 NOTE — Telephone Encounter (Signed)
Appointment R/S for Tuesday Sept 6 at 9:00 pt aware and agrees.

## 2015-05-14 MED ORDER — CEFADROXIL 500 MG PO CAPS: 500.0000 mg | ORAL_CAPSULE | Freq: Two times a day (BID) | ORAL | Status: DC

## 2015-05-14 NOTE — Telephone Encounter (Signed)
Mammosite schedule reviewed with the patient Placement  05-21-15   at [facility] Scan 05-23-15 Treat 9-12 to 9-16 Aware Abigail Butts will be calling her for more details or see her at Dr Collier Bullock appointment Aware of ATB and directions reviewed. Aware no showers and to wear her bra while mammosite in place. Pt agrees.

## 2015-05-20 ENCOUNTER — Ambulatory Visit
Admission: RE | Admit: 2015-05-20 | Discharge: 2015-05-20 | Disposition: A | Discharge status: Home / Self Care | Payer: Managed Care, Other (non HMO) | Source: Ambulatory Visit | Attending: Radiation Oncology | Admitting: Radiation Oncology

## 2015-05-20 ENCOUNTER — Institutional Professional Consult (permissible substitution): Payer: Managed Care, Other (non HMO) | Admitting: Radiation Oncology

## 2015-05-20 ENCOUNTER — Encounter: Payer: Self-pay | Admitting: Radiation Oncology

## 2015-05-20 VITALS — BP 145/96 | HR 87 | Temp 97.8°F | Resp 18 | Wt 180.1 lb

## 2015-05-20 DIAGNOSIS — Z510 Encounter for antineoplastic radiation therapy: Secondary | ICD-10-CM | POA: Insufficient documentation

## 2015-05-20 DIAGNOSIS — D0511 Intraductal carcinoma in situ of right breast: Secondary | ICD-10-CM | POA: Insufficient documentation

## 2015-05-20 DIAGNOSIS — C50911 Malignant neoplasm of unspecified site of right female breast: Secondary | ICD-10-CM

## 2015-05-20 NOTE — Consult Note (Signed)
Except an outstanding is perfect of Radiation Oncology NEW PATIENT EVALUATION  Name: Meagan York  MRN: [id number]  Date:   05/20/2015     DOB: 1956-09-13   This 59 y.o. 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 provider found  CHIEF COMPLAINT:  Chief Complaint  Patient presents with  . Breast Cancer    Pt is here for initial consultation for breast cancer.      DIAGNOSIS: The encounter diagnosis was Malignant neoplasm of female breast, right.   PREVIOUS INVESTIGATIONS:  Mammograms and ultrasound reviewed Surgical pathology report reviewed Clinical notes reviewed Case presented at breast cancer conference  HPI: Patient is a 59 year old female with long-standing history of noted mass in her right breast in the upper outer quadrant which which she states has been there since 1998. She was seen by surgeon and ultrasound confirmed a 67 cm firm mass in this region. Back in April 2016 mammogram confirm the lesion and suggested biopsy. She then underwent a wide local excision for a fibroepithelial tumor considered consistent with a Phylloides tumor measuring 7.8 cm with extensive ductal carcinoma in situ and focal keratinizing squamous epithelial lined cysts. Margins were clear at 2 mm. Tumor was overall grade 2. Patient has done well after her surgery. She's here today for discussion of adjuvant treatment. She is extremely large breasted and is reluctant to go to undergo any radiation treatment. She has a new job which she states will be extremely hard to miss any work. At this point she specifically denies breast tenderness cough or any bone pain.  PLANNED TREATMENT REGIMEN: Adjuvant radiation therapy patient is leaning towards accelerated partial breast radiation  PAST MEDICAL HISTORY:  has a past medical history of Bilateral breast cysts;  Hemorrhoids; Irritable bowel disease; Arthritis; COPD (chronic obstructive pulmonary disease); GERD (gastroesophageal reflux disease); Complication of anesthesia; and Cancer (04-25-15).    PAST SURGICAL HISTORY:  Past Surgical History  Procedure Laterality Date  . Appendectomy    . Knee surgery    . Cyst lower spine    . Piondial cyst excision  03/1989  . Breast surgery Left     cyst removal  . Breast biopsy Right 01-01-15    BIPHASIC FIBROEPITHELIAL LESION with ADH  . Breast lumpectomy Right 04/25/2015    Procedure: RIGHT BREAST LUMPECTOMY, exicision of skin tag right face;  Surgeon: Christene Lye, MD;  Location: [facility] ORS;  Service: General;  Laterality: Right;    FAMILY HISTORY: family history includes Colon cancer in her father.  SOCIAL HISTORY:  reports that she has been smoking Cigarettes.  She has a 40 pack-year smoking history. She has never used smokeless tobacco. She reports that she does not drink alcohol or use illicit drugs.  ALLERGIES: Tramadol  MEDICATIONS:  Current Outpatient Prescriptions  Medication Sig Dispense Refill  . albuterol (PROVENTIL HFA;VENTOLIN HFA) 108 (90 BASE) MCG/ACT inhaler Inhale into the lungs every 4 (four) hours as needed.     . cefadroxil (DURICEF) 500 MG capsule Take 1 capsule (500 mg total) by mouth 2 (two) times daily. Start one hour before office procedure on 05-21-15 20 capsule 0  . cholecalciferol (VITAMIN D) 1000 UNITS tablet Take 1,000 Units by mouth daily.    . Iron-Vitamins (GERITOL COMPLETE PO) Take 1 tablet by mouth daily.    . ranitidine (ZANTAC) 150 MG tablet Take 150  mg by mouth every morning.     No current facility-administered medications for this encounter.    ECOG PERFORMANCE STATUS:  0 - Asymptomatic  REVIEW OF SYSTEMS:  Patient denies any weight loss, fatigue, weakness, fever, chills or night sweats. Patient denies any loss of vision, blurred vision. Patient denies any ringing  of the ears or hearing loss. No  irregular heartbeat. Patient denies heart murmur or history of fainting. Patient denies any chest pain or pain radiating to her upper extremities. Patient denies any shortness of breath, difficulty breathing at night, cough or hemoptysis. Patient denies any swelling in the lower legs. Patient denies any nausea vomiting, vomiting of blood, or coffee ground material in the vomitus. Patient denies any stomach pain. Patient states has had normal bowel movements no significant constipation or diarrhea. Patient denies any dysuria, hematuria or significant nocturia. Patient denies any problems walking, swelling in the joints or loss of balance. Patient denies any skin changes, loss of hair or loss of weight. Patient denies any excessive worrying or anxiety or significant depression. Patient denies any problems with insomnia. Patient denies excessive thirst, polyuria, polydipsia. Patient denies any swollen glands, patient denies easy bruising or easy bleeding. Patient denies any recent infections, allergies or URI. Patient "s visual fields have not changed significantly in recent time.    PHYSICAL EXAM: BP 145/96 mmHg  Pulse 87  Temp(Src) 97.8 F (36.6 C)  Resp 18  Wt 180 lb 1.9 oz (81.7 kg) Well-developed female in NAD she status post wide local excision of the right breast in the upper outer quadrant. No dominant mass or nodularity is noted in either breast in 2 positions examined. No axillary or supraclavicular adenopathy is appreciated.  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  PLAN: Case was presented at our tumor conference and I have discussed the case personally with surgeon. Even without the ductal carcinoma in situ these type of fibroepithelial tumors have noted chance of local recurrence and  adjuvant radiation therapy is recommended. This is especially true with a large lesion like this one. The ductal carcinoma in situ is also worrisome for recurrence and I have recommended adjuvant radiation therapy to her right breast. Patient is strongly concerned about daily radiation treatments and would agree to accelerated partial breast irradiation with the tip intent to deliver 3400 cGy in 10 fractions at 340 cGy twice a day. Risks and benefits of both whole breast and accelerated treatments were reviewed with the patient. Risks and benefits such as skin reaction fatigue and inability to do the MammoSite balloon catheter depending on skin and chest wall distance all were explained in detail to the patient. She has agreed to go ahead with balloon placement tomorrow by surgeon. We will assess the MammoSite catheter placement later this week and do BrachyVision treatment planning for above-stated prescription. We're also trying to track down the ER/PR status of her ductal carcinoma in situ for further recommendations regarding tamoxifen therapy.  I would like to take this opportunity for allowing me to participate in the care of your patient.Armstead Peaks., MD

## 2015-05-21 ENCOUNTER — Encounter: Payer: Self-pay | Admitting: General Surgery

## 2015-05-21 ENCOUNTER — Ambulatory Visit: Payer: Managed Care, Other (non HMO)

## 2015-05-21 ENCOUNTER — Ambulatory Visit (INDEPENDENT_AMBULATORY_CARE_PROVIDER_SITE_OTHER): Payer: Managed Care, Other (non HMO) | Admitting: General Surgery

## 2015-05-21 VITALS — BP 122/80 | HR 84 | Resp 12 | Ht 65.0 in | Wt 181.0 lb

## 2015-05-21 DIAGNOSIS — C50911 Malignant neoplasm of unspecified site of right female breast: Secondary | ICD-10-CM

## 2015-05-21 HISTORY — PX: BREAST MAMMOSITE: SHX5264

## 2015-05-21 NOTE — Progress Notes (Signed)
She is here today for a right breast mammosite.  EXCISIONAL BREAST BIOPSY REPORT  Name:  Meagan York DOB:  1955/12/31  Vital signs:BP 122/80 mmHg  Pulse 84  Resp 12  Ht 5\' 5"  (1.651 m)  Wt 181 lb (82.101 kg)  BMI 30.12 kg/m2  The patient has been found to be an acceptable candidate for MammoSite radiation treatment for her recently diagnosed breast cancer(DCIS) of the  Right     breast after consultation with Noreene Filbert, M.D. from radiation oncology.  The patient returns today for planned insertion of a treatment balloon.  Pre-procedure ultrasound has shown an adequate buffer between the skin and the underlying cavity from her wide excision.  A total of 10cc of 1% Xylocaine with 0.5% Marcaine and 36ml of 1% xylocaine with epi was used for local anesthesia and was well tolerated.  The breast was then prepped with Chloro prepx3 and draped. Under ultrasound guidance the 5-6 centimeter spherical MammoSite balloon was inserted through a lateral incision. This was inflated to a volume of 35cc with normal saline mixed with Omnipaque. Skin distance to balloon was 8-70mm The procedure was well tolerated.  Scant bleeding was noted.  Antibiotic cream was placed at the insertion site and a gauze pad applied.  She has an appointment with radiation oncology staff for a CT scan of the breast to confirm balloon size and skin margins in the near future.  CC:  No PCP Per Patient CC: Noreene Filbert, M.D.  Pattrick Bady G

## 2015-05-21 NOTE — Patient Instructions (Signed)
Patient care kit given to patient.  Instructed no showers, sponge bath while mammosite in place, take antibiotic. Follow up with Cancer Center as arranged. Discussed wearing your bra for support at all times. 

## 2015-05-22 ENCOUNTER — Telehealth: Payer: Self-pay | Admitting: General Surgery

## 2015-05-22 NOTE — Telephone Encounter (Signed)
PT CALLED ASKING FOR DR Jamal Collin TO CALL HER IN SOME PAIN MEDS TO THE [facility] IN [place](TO LAST UNTIL NEXT Friday WHEN SHE WILL GET IT REMOVED . SHE'S IN PAIN FROM HAVING THE MAMMO-SITE PLACED & IS  WORKING 12HRS A DAY. TODAY SHE HAS TAKEN (2) ALEVE'S AND THAT ISN'T WORKING. PLEASE CALL AND L/M FOR PT ON HER CELL.

## 2015-05-22 NOTE — Telephone Encounter (Signed)
Left message. Patient to come by the office to pick up a RX for pain medication.

## 2015-05-23 ENCOUNTER — Ambulatory Visit
Admission: RE | Admit: 2015-05-23 | Discharge: 2015-05-23 | Disposition: A | Discharge status: Home / Self Care | Payer: Managed Care, Other (non HMO) | Source: Ambulatory Visit | Attending: Radiation Oncology | Admitting: Radiation Oncology

## 2015-05-23 ENCOUNTER — Encounter: Payer: Self-pay | Admitting: *Deleted

## 2015-05-23 DIAGNOSIS — D0511 Intraductal carcinoma in situ of right breast: Secondary | ICD-10-CM | POA: Diagnosis not present

## 2015-05-23 DIAGNOSIS — Z510 Encounter for antineoplastic radiation therapy: Secondary | ICD-10-CM | POA: Diagnosis not present

## 2015-05-23 NOTE — Progress Notes (Signed)
  Oncology Nurse Navigator Documentation  Referral date to RadOnc/MedOnc: 05/23/15 (05/23/15 1000) Navigator Encounter Type: Initial RadOnc (05/23/15 1000) Patient Visit Type: Initial (05/23/15 1000) Treatment Phase: CT SIM (05/23/15 1000) Barriers/Navigation Needs: Financial (05/23/15 1000)   Interventions: Other (05/23/15 1000)            Time Spent with Patient: 30 (05/23/15 1000)   Met with patient today during her SIM for mammosite.  Gave patient breast cancer educational literature, "My Breast Cancer Treatment Handbook" by Josephine Igo, RN.  She is to call if she has any questions or needs.  Patient may have some financial concerns, she supports her 2 sons and 2 grandchildren.  She does not want to miss work or "mess up my job".  Reviewed resources available if needed.

## 2015-05-26 ENCOUNTER — Institutional Professional Consult (permissible substitution): Payer: Managed Care, Other (non HMO) | Admitting: Radiation Oncology

## 2015-05-26 ENCOUNTER — Ambulatory Visit
Admission: RE | Admit: 2015-05-26 | Discharge: 2015-05-26 | Disposition: A | Discharge status: Home / Self Care | Payer: Managed Care, Other (non HMO) | Source: Ambulatory Visit | Attending: Radiation Oncology | Admitting: Radiation Oncology

## 2015-05-26 DIAGNOSIS — Z510 Encounter for antineoplastic radiation therapy: Secondary | ICD-10-CM | POA: Diagnosis not present

## 2015-05-27 ENCOUNTER — Ambulatory Visit
Admission: RE | Admit: 2015-05-27 | Discharge: 2015-05-27 | Disposition: A | Discharge status: Home / Self Care | Payer: Managed Care, Other (non HMO) | Source: Ambulatory Visit | Attending: Radiation Oncology | Admitting: Radiation Oncology

## 2015-05-27 ENCOUNTER — Telehealth: Payer: Self-pay | Admitting: *Deleted

## 2015-05-27 DIAGNOSIS — Z510 Encounter for antineoplastic radiation therapy: Secondary | ICD-10-CM | POA: Diagnosis not present

## 2015-05-27 NOTE — Telephone Encounter (Signed)
Patient left me a message that she needed financial assistance.  Returned her call and left her a message to call me back, and we would look at ways we could help her financially and possible referral to the social worker.

## 2015-05-28 ENCOUNTER — Ambulatory Visit
Admission: RE | Admit: 2015-05-28 | Discharge: 2015-05-28 | Disposition: A | Discharge status: Home / Self Care | Payer: Managed Care, Other (non HMO) | Source: Ambulatory Visit | Attending: Radiation Oncology | Admitting: Radiation Oncology

## 2015-05-28 DIAGNOSIS — Z510 Encounter for antineoplastic radiation therapy: Secondary | ICD-10-CM | POA: Diagnosis not present

## 2015-05-29 ENCOUNTER — Ambulatory Visit
Admission: RE | Admit: 2015-05-29 | Discharge: 2015-05-29 | Disposition: A | Discharge status: Home / Self Care | Payer: Managed Care, Other (non HMO) | Source: Ambulatory Visit | Attending: Radiation Oncology | Admitting: Radiation Oncology

## 2015-05-29 ENCOUNTER — Encounter: Payer: Self-pay | Admitting: *Deleted

## 2015-05-29 DIAGNOSIS — Z510 Encounter for antineoplastic radiation therapy: Secondary | ICD-10-CM | POA: Diagnosis not present

## 2015-05-30 ENCOUNTER — Ambulatory Visit
Admission: RE | Admit: 2015-05-30 | Discharge: 2015-05-30 | Disposition: A | Discharge status: Home / Self Care | Payer: Managed Care, Other (non HMO) | Source: Ambulatory Visit | Attending: Radiation Oncology | Admitting: Radiation Oncology

## 2015-05-30 DIAGNOSIS — Z510 Encounter for antineoplastic radiation therapy: Secondary | ICD-10-CM | POA: Diagnosis not present

## 2015-06-04 ENCOUNTER — Encounter: Payer: Self-pay | Admitting: General Surgery

## 2015-06-04 ENCOUNTER — Ambulatory Visit: Payer: Managed Care, Other (non HMO) | Admitting: General Surgery

## 2015-06-04 ENCOUNTER — Ambulatory Visit (INDEPENDENT_AMBULATORY_CARE_PROVIDER_SITE_OTHER): Payer: Managed Care, Other (non HMO) | Admitting: General Surgery

## 2015-06-04 DIAGNOSIS — D0511 Intraductal carcinoma in situ of right breast: Secondary | ICD-10-CM

## 2015-06-04 MED ORDER — LETROZOLE 2.5 MG PO TABS: 2.5000 mg | ORAL_TABLET | Freq: Every day | ORAL | Status: DC

## 2015-06-04 NOTE — Patient Instructions (Addendum)
The patient is aware to call back for any questions or concerns. Letrozole tablets What is this medicine? LETROZOLE (LET roe zole) blocks the production of estrogen. Certain types of breast cancer grow under the influence of estrogen. Letrozole helps block tumor growth. This medicine is used to treat advanced breast cancer in postmenopausal women. This medicine may be used for other purposes; ask your health care provider or pharmacist if you have questions. COMMON BRAND NAME(S): Femara What should I tell my health care provider before I take this medicine? They need to know if you have any of these conditions: -liver disease -osteoporosis (weak bones) -an unusual or allergic reaction to letrozole, other medicines, foods, dyes, or preservatives -pregnant or trying to get pregnant -breast-feeding How should I use this medicine? Take this medicine by mouth with a glass of water. You may take it with or without food. Follow the directions on the prescription label. Take your medicine at regular intervals. Do not take your medicine more often than directed. Do not stop taking except on your doctor's advice. Talk to your pediatrician regarding the use of this medicine in children. Special care may be needed. Overdosage: If you think you have taken too much of this medicine contact a poison control center or emergency room at once. NOTE: This medicine is only for you. Do not share this medicine with others. What if I miss a dose? If you miss a dose, take it as soon as you can. If it is almost time for your next dose, take only that dose. Do not take double or extra doses. What may interact with this medicine? Do not take this medicine with any of the following medications: -estrogens, like hormone replacement therapy or birth control pills This medicine may also interact with the following medications: -dietary supplements such as androstenedione or DHEA -prasterone -tamoxifen This list may not  describe all possible interactions. Give your health care provider a list of all the medicines, herbs, non-prescription drugs, or dietary supplements you use. Also tell them if you smoke, drink alcohol, or use illegal drugs. Some items may interact with your medicine. What should I watch for while using this medicine? Visit your doctor or health care professional for regular check-ups to monitor your condition. Do not use this drug if you are pregnant. Serious side effects to an unborn child are possible. Talk to your doctor or pharmacist for more information. You may get drowsy or dizzy. Do not drive, use machinery, or do anything that needs mental alertness until you know how this medicine affects you. Do not stand or sit up quickly, especially if you are an older patient. This reduces the risk of dizzy or fainting spells. What side effects may I notice from receiving this medicine? Side effects that you should report to your doctor or health care professional as soon as possible: -allergic reactions like skin rash, itching, or hives -bone fracture -chest pain -difficulty breathing or shortness of breath -severe pain, swelling, warmth in the leg -unusually weak or tired -vaginal bleeding Side effects that usually do not require medical attention (report to your doctor or health care professional if they continue or are bothersome): -bone, back, joint, or muscle pain -dizziness -fatigue -fluid retention -headache -hot flashes, night sweats -nausea -weight gain This list may not describe all possible side effects. Call your doctor for medical advice about side effects. You may report side effects to FDA at 1-800-FDA-1088. Where should I keep my medicine? Keep out of the reach  of children. Store between 15 and 30 degrees C (59 and 86 degrees F). Throw away any unused medicine after the expiration date. NOTE: This sheet is a summary. It may not cover all possible information. If you have  questions about this medicine, talk to your doctor, pharmacist, or health care provider.  2015, Elsevier/Gold Standard. (2007-11-10 16:43:44)

## 2015-06-04 NOTE — Progress Notes (Signed)
Here today for follow up from right breast mammostie. She states the treatments went well.   The lumpectomy and treatment site is clean and no signs of skin changes or infection. Discussed risk and benefits of Femara therapy, patient agrees. Follow up in 6 weeks.  PCP:  No Pcp

## 2015-06-14 DIAGNOSIS — K922 Gastrointestinal hemorrhage, unspecified: Secondary | ICD-10-CM

## 2015-06-14 DIAGNOSIS — T39395A Adverse effect of other nonsteroidal anti-inflammatory drugs [NSAID], initial encounter: Secondary | ICD-10-CM | POA: Insufficient documentation

## 2015-06-14 HISTORY — DX: Adverse effect of other nonsteroidal anti-inflammatory drugs (NSAID), initial encounter: K92.2

## 2015-06-24 NOTE — Progress Notes (Unsigned)
Card sent for follow up.

## 2015-07-04 ENCOUNTER — Inpatient Hospital Stay
Admission: EM | Admit: 2015-07-04 | Discharge: 2015-07-07 | DRG: 379 | Disposition: A | Discharge status: Home / Self Care | Payer: Managed Care, Other (non HMO) | Attending: Internal Medicine | Admitting: Internal Medicine

## 2015-07-04 ENCOUNTER — Encounter: Payer: Self-pay | Admitting: Emergency Medicine

## 2015-07-04 DIAGNOSIS — K580 Irritable bowel syndrome with diarrhea: Secondary | ICD-10-CM | POA: Diagnosis present

## 2015-07-04 DIAGNOSIS — D500 Iron deficiency anemia secondary to blood loss (chronic): Secondary | ICD-10-CM | POA: Diagnosis present

## 2015-07-04 DIAGNOSIS — E876 Hypokalemia: Secondary | ICD-10-CM | POA: Diagnosis present

## 2015-07-04 DIAGNOSIS — M1990 Unspecified osteoarthritis, unspecified site: Secondary | ICD-10-CM | POA: Diagnosis present

## 2015-07-04 DIAGNOSIS — K921 Melena: Principal | ICD-10-CM | POA: Diagnosis present

## 2015-07-04 DIAGNOSIS — G8929 Other chronic pain: Secondary | ICD-10-CM | POA: Diagnosis present

## 2015-07-04 DIAGNOSIS — K644 Residual hemorrhoidal skin tags: Secondary | ICD-10-CM | POA: Diagnosis present

## 2015-07-04 DIAGNOSIS — K259 Gastric ulcer, unspecified as acute or chronic, without hemorrhage or perforation: Secondary | ICD-10-CM | POA: Diagnosis present

## 2015-07-04 DIAGNOSIS — M549 Dorsalgia, unspecified: Secondary | ICD-10-CM | POA: Diagnosis present

## 2015-07-04 DIAGNOSIS — K640 First degree hemorrhoids: Secondary | ICD-10-CM | POA: Diagnosis present

## 2015-07-04 DIAGNOSIS — Z800 Family history of malignant neoplasm of digestive organs: Secondary | ICD-10-CM

## 2015-07-04 DIAGNOSIS — K922 Gastrointestinal hemorrhage, unspecified: Secondary | ICD-10-CM | POA: Diagnosis present

## 2015-07-04 DIAGNOSIS — M2550 Pain in unspecified joint: Secondary | ICD-10-CM | POA: Diagnosis present

## 2015-07-04 DIAGNOSIS — K222 Esophageal obstruction: Secondary | ICD-10-CM | POA: Diagnosis present

## 2015-07-04 DIAGNOSIS — R1013 Epigastric pain: Secondary | ICD-10-CM | POA: Diagnosis present

## 2015-07-04 DIAGNOSIS — J449 Chronic obstructive pulmonary disease, unspecified: Secondary | ICD-10-CM | POA: Diagnosis present

## 2015-07-04 DIAGNOSIS — F17210 Nicotine dependence, cigarettes, uncomplicated: Secondary | ICD-10-CM | POA: Diagnosis present

## 2015-07-04 DIAGNOSIS — K219 Gastro-esophageal reflux disease without esophagitis: Secondary | ICD-10-CM | POA: Diagnosis present

## 2015-07-04 LAB — URINALYSIS COMPLETE WITH MICROSCOPIC (ARMC ONLY)
Bacteria, UA: NONE SEEN
Bilirubin Urine: NEGATIVE
Glucose, UA: NEGATIVE mg/dL
Hgb urine dipstick: NEGATIVE
Ketones, ur: NEGATIVE mg/dL
Nitrite: NEGATIVE
Protein, ur: NEGATIVE mg/dL
SPECIFIC GRAVITY, URINE: 1.014 (ref 1.005–1.030)
pH: 6 (ref 5.0–8.0)

## 2015-07-04 LAB — CBC
HCT: 31.5 % — ABNORMAL LOW (ref 35.0–47.0)
HEMOGLOBIN: 10.4 g/dL — AB (ref 12.0–16.0)
MCH: 30.3 pg (ref 26.0–34.0)
MCHC: 33.1 g/dL (ref 32.0–36.0)
MCV: 91.8 fL (ref 80.0–100.0)
Platelets: 250 10*3/uL (ref 150–440)
RBC: 3.43 MIL/uL — AB (ref 3.80–5.20)
RDW: 13.2 % (ref 11.5–14.5)
WBC: 7.6 10*3/uL (ref 3.6–11.0)

## 2015-07-04 LAB — COMPREHENSIVE METABOLIC PANEL
ALK PHOS: 75 U/L (ref 38–126)
ALT: 17 U/L (ref 14–54)
AST: 19 U/L (ref 15–41)
Albumin: 3.4 g/dL — ABNORMAL LOW (ref 3.5–5.0)
Anion gap: 4 — ABNORMAL LOW (ref 5–15)
BUN: 21 mg/dL — ABNORMAL HIGH (ref 6–20)
CALCIUM: 9.1 mg/dL (ref 8.9–10.3)
CHLORIDE: 108 mmol/L (ref 101–111)
CO2: 27 mmol/L (ref 22–32)
Creatinine, Ser: 0.61 mg/dL (ref 0.44–1.00)
GFR calc non Af Amer: >60 mL/min (ref >60)
Glucose, Bld: 121 mg/dL — ABNORMAL HIGH (ref 65–99)
Potassium: 3.4 mmol/L — ABNORMAL LOW (ref 3.5–5.1)
SODIUM: 139 mmol/L (ref 135–145)
Total Bilirubin: 0.4 mg/dL (ref 0.3–1.2)
Total Protein: 6.5 g/dL (ref 6.5–8.1)

## 2015-07-04 LAB — TROPONIN I: Troponin I: <0.03 ng/mL (ref <0.031)

## 2015-07-04 LAB — LIPASE, BLOOD: LIPASE: 23 U/L (ref 11–51)

## 2015-07-04 NOTE — ED Notes (Signed)
Pt states she has had black liquid stools for two days. Reports nausea but denies vomiting. States she has been dizzy. Denies fever. States she has had hot flashes.

## 2015-07-04 NOTE — ED Notes (Signed)
Pt placed on cardiac monitor in sinus tachycardia, call bell at side.

## 2015-07-04 NOTE — ED Notes (Signed)
Pt states "it's been a slow thing coming on, but i don't feel good." pt complains of epigastric pain, black liquid stool, back pain, "lung pain", dizziness, "feeling like i'm going into menopause, left foot swelling, and "i feel tired all the time." pt able to speak in full sentences without difficulty.

## 2015-07-05 ENCOUNTER — Encounter: Payer: Self-pay | Admitting: Radiology

## 2015-07-05 ENCOUNTER — Emergency Department: Payer: Managed Care, Other (non HMO)

## 2015-07-05 DIAGNOSIS — K222 Esophageal obstruction: Secondary | ICD-10-CM | POA: Diagnosis present

## 2015-07-05 DIAGNOSIS — K644 Residual hemorrhoidal skin tags: Secondary | ICD-10-CM | POA: Diagnosis present

## 2015-07-05 DIAGNOSIS — D500 Iron deficiency anemia secondary to blood loss (chronic): Secondary | ICD-10-CM | POA: Diagnosis present

## 2015-07-05 DIAGNOSIS — M1990 Unspecified osteoarthritis, unspecified site: Secondary | ICD-10-CM | POA: Diagnosis present

## 2015-07-05 DIAGNOSIS — K922 Gastrointestinal hemorrhage, unspecified: Secondary | ICD-10-CM | POA: Diagnosis present

## 2015-07-05 DIAGNOSIS — F17210 Nicotine dependence, cigarettes, uncomplicated: Secondary | ICD-10-CM | POA: Diagnosis present

## 2015-07-05 DIAGNOSIS — G8929 Other chronic pain: Secondary | ICD-10-CM | POA: Diagnosis present

## 2015-07-05 DIAGNOSIS — Z800 Family history of malignant neoplasm of digestive organs: Secondary | ICD-10-CM | POA: Diagnosis not present

## 2015-07-05 DIAGNOSIS — M549 Dorsalgia, unspecified: Secondary | ICD-10-CM | POA: Diagnosis present

## 2015-07-05 DIAGNOSIS — K580 Irritable bowel syndrome with diarrhea: Secondary | ICD-10-CM | POA: Diagnosis present

## 2015-07-05 DIAGNOSIS — R1013 Epigastric pain: Secondary | ICD-10-CM | POA: Diagnosis present

## 2015-07-05 DIAGNOSIS — J449 Chronic obstructive pulmonary disease, unspecified: Secondary | ICD-10-CM | POA: Diagnosis present

## 2015-07-05 DIAGNOSIS — K640 First degree hemorrhoids: Secondary | ICD-10-CM | POA: Diagnosis present

## 2015-07-05 DIAGNOSIS — K921 Melena: Secondary | ICD-10-CM | POA: Diagnosis present

## 2015-07-05 DIAGNOSIS — K219 Gastro-esophageal reflux disease without esophagitis: Secondary | ICD-10-CM | POA: Diagnosis present

## 2015-07-05 DIAGNOSIS — E876 Hypokalemia: Secondary | ICD-10-CM | POA: Diagnosis present

## 2015-07-05 DIAGNOSIS — M2550 Pain in unspecified joint: Secondary | ICD-10-CM | POA: Diagnosis present

## 2015-07-05 DIAGNOSIS — K259 Gastric ulcer, unspecified as acute or chronic, without hemorrhage or perforation: Secondary | ICD-10-CM | POA: Diagnosis present

## 2015-07-05 LAB — CBC
HCT: 27 % — ABNORMAL LOW (ref 35.0–47.0)
HEMATOCRIT: 27.1 % — AB (ref 35.0–47.0)
HEMATOCRIT: 25.6 % — AB (ref 35.0–47.0)
HEMATOCRIT: 27.6 % — AB (ref 35.0–47.0)
HEMOGLOBIN: 8.5 g/dL — AB (ref 12.0–16.0)
Hemoglobin: 9.2 g/dL — ABNORMAL LOW (ref 12.0–16.0)
Hemoglobin: 8.9 g/dL — ABNORMAL LOW (ref 12.0–16.0)
Hemoglobin: 9.1 g/dL — ABNORMAL LOW (ref 12.0–16.0)
MCH: 31.8 pg (ref 26.0–34.0)
MCH: 30.5 pg (ref 26.0–34.0)
MCH: 30.6 pg (ref 26.0–34.0)
MCH: 30.7 pg (ref 26.0–34.0)
MCHC: 34.2 g/dL (ref 32.0–36.0)
MCHC: 33.2 g/dL (ref 32.0–36.0)
MCHC: 33 g/dL (ref 32.0–36.0)
MCHC: 33 g/dL (ref 32.0–36.0)
MCV: 93 fL (ref 80.0–100.0)
MCV: 92 fL (ref 80.0–100.0)
MCV: 92.8 fL (ref 80.0–100.0)
MCV: 92.9 fL (ref 80.0–100.0)
PLATELETS: 206 10*3/uL (ref 150–440)
PLATELETS: 235 10*3/uL (ref 150–440)
Platelets: 216 10*3/uL (ref 150–440)
Platelets: 193 10*3/uL (ref 150–440)
RBC: 2.91 MIL/uL — AB (ref 3.80–5.20)
RBC: 2.79 MIL/uL — ABNORMAL LOW (ref 3.80–5.20)
RBC: 2.91 MIL/uL — AB (ref 3.80–5.20)
RBC: 2.97 MIL/uL — ABNORMAL LOW (ref 3.80–5.20)
RDW: 13.1 % (ref 11.5–14.5)
RDW: 13.2 % (ref 11.5–14.5)
RDW: 13.1 % (ref 11.5–14.5)
RDW: 13.5 % (ref 11.5–14.5)
WBC: 6.7 10*3/uL (ref 3.6–11.0)
WBC: 6.5 10*3/uL (ref 3.6–11.0)
WBC: 6.1 10*3/uL (ref 3.6–11.0)
WBC: 7.9 10*3/uL (ref 3.6–11.0)

## 2015-07-05 MED ORDER — IOHEXOL 350 MG/ML SOLN
100.0000 mL | Freq: Once | INTRAVENOUS | Status: AC | PRN
  Administered 2015-07-05: 100 mL via INTRAVENOUS

## 2015-07-05 MED ORDER — MORPHINE SULFATE (PF) 2 MG/ML IV SOLN
2.0000 mg | Freq: Once | INTRAVENOUS | Status: AC
  Administered 2015-07-05: 2 mg via INTRAVENOUS
  Filled 2015-07-05: qty 1

## 2015-07-05 MED ORDER — POTASSIUM CHLORIDE CRYS ER 20 MEQ PO TBCR
40.0000 meq | EXTENDED_RELEASE_TABLET | Freq: Once | ORAL | Status: AC
Start: 2015-07-05 — End: 2015-07-05
  Administered 2015-07-05: 40 meq via ORAL
  Filled 2015-07-05: qty 2

## 2015-07-05 MED ORDER — MORPHINE SULFATE (PF) 2 MG/ML IV SOLN
2.0000 mg | INTRAVENOUS | Status: DC | PRN
  Administered 2015-07-06: 2 mg via INTRAVENOUS
  Filled 2015-07-05: qty 1

## 2015-07-05 MED ORDER — SODIUM CHLORIDE 0.9 % IV BOLUS (SEPSIS)
1000.0000 mL | Freq: Once | INTRAVENOUS | Status: AC
  Administered 2015-07-05: 1000 mL via INTRAVENOUS

## 2015-07-05 MED ORDER — VITAMIN D 1000 UNITS PO TABS
1000.0000 [IU] | ORAL_TABLET | Freq: Every day | ORAL | Status: DC
  Administered 2015-07-05 – 2015-07-06 (×2): 1000 [IU] via ORAL
  Filled 2015-07-05 (×2): qty 1

## 2015-07-05 MED ORDER — AZITHROMYCIN 250 MG PO TABS: 500.0000 mg | ORAL_TABLET | Freq: Every day | ORAL | Status: DC

## 2015-07-05 MED ORDER — ONDANSETRON HCL 4 MG/2ML IJ SOLN: 4.0000 mg | Freq: Four times a day (QID) | INTRAMUSCULAR | Status: DC | PRN

## 2015-07-05 MED ORDER — ACETAMINOPHEN 325 MG PO TABS: 650.0000 mg | ORAL_TABLET | Freq: Four times a day (QID) | ORAL | Status: DC | PRN

## 2015-07-05 MED ORDER — ADULT MULTIVITAMIN W/MINERALS CH
1.0000 | ORAL_TABLET | Freq: Every day | ORAL | Status: DC
  Administered 2015-07-05 – 2015-07-06 (×2): 1 via ORAL
  Filled 2015-07-05 (×2): qty 1

## 2015-07-05 MED ORDER — SODIUM CHLORIDE 0.9 % IV SOLN
INTRAVENOUS | Status: DC
  Administered 2015-07-05 – 2015-07-06 (×5): via INTRAVENOUS

## 2015-07-05 MED ORDER — OXYCODONE HCL 5 MG PO TABS
5.0000 mg | ORAL_TABLET | ORAL | Status: DC | PRN
  Administered 2015-07-05 – 2015-07-07 (×12): 5 mg via ORAL
  Filled 2015-07-05 (×11): qty 1

## 2015-07-05 MED ORDER — ACETAMINOPHEN 650 MG RE SUPP: 650.0000 mg | Freq: Four times a day (QID) | RECTAL | Status: DC | PRN

## 2015-07-05 MED ORDER — ALBUTEROL SULFATE (2.5 MG/3ML) 0.083% IN NEBU: 3.0000 mL | INHALATION_SOLUTION | RESPIRATORY_TRACT | Status: DC | PRN

## 2015-07-05 MED ORDER — HYDROMORPHONE HCL 1 MG/ML IJ SOLN
1.0000 mg | Freq: Once | INTRAMUSCULAR | Status: AC
  Administered 2015-07-05: 1 mg via INTRAVENOUS
  Filled 2015-07-05: qty 1

## 2015-07-05 MED ORDER — ONDANSETRON HCL 4 MG PO TABS: 4.0000 mg | ORAL_TABLET | Freq: Four times a day (QID) | ORAL | Status: DC | PRN

## 2015-07-05 MED ORDER — PANTOPRAZOLE SODIUM 40 MG IV SOLR
40.0000 mg | Freq: Two times a day (BID) | INTRAVENOUS | Status: DC
  Administered 2015-07-05 – 2015-07-06 (×5): 40 mg via INTRAVENOUS
  Filled 2015-07-05 (×5): qty 40

## 2015-07-05 MED ORDER — GERITOL COMPLETE PO TABS: ORAL_TABLET | Freq: Every day | ORAL | Status: DC

## 2015-07-05 MED ORDER — ONDANSETRON HCL 4 MG/2ML IJ SOLN
4.0000 mg | Freq: Once | INTRAMUSCULAR | Status: AC
  Administered 2015-07-05: 4 mg via INTRAVENOUS
  Filled 2015-07-05: qty 2

## 2015-07-05 MED ORDER — LETROZOLE 2.5 MG PO TABS
2.5000 mg | ORAL_TABLET | Freq: Every day | ORAL | Status: DC
  Administered 2015-07-05 – 2015-07-06 (×2): 2.5 mg via ORAL
  Filled 2015-07-05 (×3): qty 1

## 2015-07-05 MED ORDER — IOHEXOL 240 MG/ML SOLN
25.0000 mL | Freq: Once | INTRAMUSCULAR | Status: AC | PRN
  Administered 2015-07-05: 25 mL via ORAL

## 2015-07-05 MED ORDER — NICOTINE 10 MG IN INHA
1.0000 | RESPIRATORY_TRACT | Status: DC | PRN
  Administered 2015-07-05: 1 via RESPIRATORY_TRACT
  Filled 2015-07-05: qty 36

## 2015-07-05 MED ORDER — AZITHROMYCIN 250 MG PO TABS
500.0000 mg | ORAL_TABLET | Freq: Every day | ORAL | Status: DC
  Administered 2015-07-05 – 2015-07-06 (×2): 500 mg via ORAL
  Filled 2015-07-05 (×2): qty 2

## 2015-07-05 NOTE — H&P (Signed)
[facility] [place] NAME: Peggi Yono    MR#:  [id number]  DATE OF BIRTH:  Sep 26, 1955   DATE OF ADMISSION:  07/04/2015  PRIMARY CARE PHYSICIAN: No PCP Per Patient   REQUESTING/REFERRING PHYSICIAN: Owens Shark  CHIEF COMPLAINT:   Chief Complaint  Patient presents with  . Abdominal Pain  . Diarrhea  . Back Pain   shortness of breath/cough  HISTORY OF PRESENT ILLNESS:  Deepti Gunawan  is a 59 y.o. female with a known history of COPD non-oxygen dependent who is presenting with multiple complaints. Mainly 2-3 day duration of abdominal pain epigastric location burning quality 7/10 intensity no worsening or relieving factors with associated black tarry stools. She also complains of shortness of breath mainly dyspnea on exertion with associated cough, nonproductive subjective chills however denies fever denies any further symptomatology at this time.  PAST MEDICAL HISTORY:   Past Medical History  Diagnosis Date  . Bilateral breast cysts   . Hemorrhoids   . Irritable bowel disease   . Arthritis   . COPD (chronic obstructive pulmonary disease) ([place])   . GERD (gastroesophageal reflux disease)   . Complication of anesthesia     pt woke up during breast surgery (1995)  . 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 Appendectomy    . Knee surgery    . Cyst lower spine    . Piondial cyst excision  03/1989  . Breast surgery Left     cyst removal  . Breast biopsy Right 01-01-15    BIPHASIC FIBROEPITHELIAL LESION with ADH  . Breast lumpectomy Right 04/25/2015    Procedure: RIGHT BREAST LUMPECTOMY, exicision of skin tag right face;  Surgeon: Christene Lye, MD;  Location: [facility] ORS;  Service: General;  Laterality: Right;  . Breast mammosite Right 05-21-15    SOCIAL HISTORY:   Social History  Substance  Use Topics  . Smoking status: Current Every Day Smoker -- 1.00 packs/day for 40 years    Types: Cigarettes  . Smokeless tobacco: Never Used  . Alcohol Use: No    FAMILY HISTORY:   Family History  Problem Relation Age of Onset  . Colon cancer Father     age 34    DRUG ALLERGIES:   Allergies  Allergen Reactions  . Tramadol Nausea And Vomiting    REVIEW OF SYSTEMS:  REVIEW OF SYSTEMS:  CONSTITUTIONAL: Denies fevers, positive chills, fatigue, weakness.  EYES: Denies blurred vision, double vision, or eye pain.  EARS, NOSE, THROAT: Denies tinnitus, ear pain, hearing loss.  RESPIRATORY: Positive cough, shortness of breath, denies wheezing  CARDIOVASCULAR: Denies chest pain, palpitations, edema.  GASTROINTESTINAL: Denies nausea, vomiting, diarrhea, positive abdominal pain.  GENITOURINARY: Denies dysuria, hematuria.  ENDOCRINE: Denies nocturia or thyroid problems. HEMATOLOGIC AND LYMPHATIC: Denies easy bruising or bleeding.  SKIN: Denies rash or lesions.  MUSCULOSKELETAL: Denies pain in neck, back, shoulder, knees, hips, or further arthritic symptoms.  NEUROLOGIC: Denies paralysis, paresthesias.  PSYCHIATRIC: Denies anxiety or depressive symptoms. Otherwise full review of systems performed by me is negative.   MEDICATIONS AT HOME:   Prior to Admission medications   Medication Sig Start Date End Date Taking? Authorizing Provider  cholecalciferol (VITAMIN D) 1000 UNITS tablet Take 1,000 Units by mouth daily.   Yes Historical Provider, MD  letrozole (FEMARA) 2.5 MG  tablet Take 1 tablet (2.5 mg total) by mouth daily. 06/04/15  Yes Seeplaputhur Robinette Haines, MD  ranitidine (ZANTAC) 150 MG tablet Take 150 mg by mouth 2 (two) times daily as needed.    Yes Historical Provider, MD  albuterol (PROVENTIL HFA;VENTOLIN HFA) 108 (90 BASE) MCG/ACT inhaler Inhale into the lungs every 4 (four) hours as needed.  10/17/14 10/17/15  Historical Provider, MD  Iron-Vitamins (GERITOL COMPLETE PO) Take 1 tablet  by mouth daily.    Historical Provider, MD      VITAL SIGNS:  Blood pressure 110/95, pulse 93, temperature 98 F (36.7 C), temperature source Oral, resp. rate 18, height 5\' 5"  (1.651 m), weight 190 lb (86.183 kg), SpO2 100 %.  PHYSICAL EXAMINATION:  VITAL SIGNS: Filed Vitals:   07/05/15 0300  BP: 110/95  Pulse: 93  Temp:   Resp: 18   GENERAL:59 y.o.female currently in no acute distress.  HEAD: Normocephalic, atraumatic.  EYES: Pupils equal, round, reactive to light. Extraocular muscles intact. No scleral icterus.  MOUTH: Moist mucosal membrane. Dentition intact. No abscess noted.  EAR, NOSE, THROAT: Clear without exudates. No external lesions.  NECK: Supple. No thyromegaly. No nodules. No JVD.  PULMONARY: Clear to ascultation, without wheeze rails or rhonci. No use of accessory muscles, Good respiratory effort. good air entry bilaterally CHEST: Nontender to palpation.  CARDIOVASCULAR: S1 and S2. Regular rate and rhythm. No murmurs, rubs, or gallops. No edema. Pedal pulses 2+ bilaterally.  GASTROINTESTINAL: Soft, nontender, nondistended. No masses. Positive bowel sounds. No hepatosplenomegaly.  MUSCULOSKELETAL: No swelling, clubbing, or edema. Range of motion full in all extremities.  NEUROLOGIC: Cranial nerves II through XII are intact. No gross focal neurological deficits. Sensation intact. Reflexes intact.  SKIN: No ulceration, lesions, rashes, or cyanosis. Skin warm and dry. Turgor intact.  PSYCHIATRIC: Mood, affect within normal limits. The patient is awake, alert and oriented x 3. Insight, judgment intact.    LABORATORY PANEL:   CBC  Recent Labs Lab 07/04/15 2046  WBC 7.6  HGB 10.4*  HCT 31.5*  PLT 250   ------------------------------------------------------------------------------------------------------------------  Chemistries   Recent Labs Lab 07/04/15 2046  NA 139  K 3.4*  CL 108  CO2 27  GLUCOSE 121*  BUN 21*  CREATININE 0.61  CALCIUM 9.1  AST 19   ALT 17  ALKPHOS 75  BILITOT 0.4   ------------------------------------------------------------------------------------------------------------------  Cardiac Enzymes  Recent Labs Lab 07/04/15 2046  TROPONINI <0.03   ------------------------------------------------------------------------------------------------------------------  RADIOLOGY:  Ct Angio Chest Pe W/cm &/or Wo Cm  07/05/2015  CLINICAL DATA:  Acute onset of epigastric abdominal pain. Black liquid stool and back pain. Chest pain and dizziness. Left foot swelling and tiredness. Initial encounter. EXAM: CT ANGIOGRAPHY CHEST CT ABDOMEN AND PELVIS WITH CONTRAST TECHNIQUE: Multidetector CT imaging of the chest was performed using the standard protocol during bolus administration of intravenous contrast. Multiplanar CT image reconstructions and MIPs were obtained to evaluate the vascular anatomy. Multidetector CT imaging of the abdomen and pelvis was performed using the standard protocol during bolus administration of intravenous contrast. CONTRAST:  153mL OMNIPAQUE IOHEXOL 350 MG/ML SOLN COMPARISON:  CT of the abdomen and pelvis from 08/07/2012, and chest radiograph performed 06/19/2014 FINDINGS: CTA CHEST FINDINGS There is no evidence of pulmonary embolus. A few tiny hazy peribronchial opacities are noted within the upper lung lobes bilaterally, raising question for a mild acute infectious process. There is no evidence of pleural effusion or pneumothorax. No masses are identified; no abnormal focal contrast enhancement is seen. The mediastinum is  unremarkable in appearance. No mediastinal lymphadenopathy is seen. No pericardial effusion is identified. The great vessels are grossly unremarkable in appearance. No axillary lymphadenopathy is seen. The visualized portions of the thyroid gland are unremarkable in appearance. No acute osseous abnormalities are seen. CT ABDOMEN and PELVIS FINDINGS The liver and spleen are unremarkable in  appearance. The gallbladder is within normal limits. The pancreas and adrenal glands are unremarkable. The kidneys are unremarkable in appearance. There is no evidence of hydronephrosis. No renal or ureteral stones are seen. No perinephric stranding is appreciated. No free fluid is identified. The small bowel is unremarkable in appearance. The stomach is within normal limits. No acute vascular abnormalities are seen. Mild calcification is noted along the abdominal aorta and its branches. The patient status post appendectomy. The colon is unremarkable in appearance. The bladder is decompressed and not well assessed. The uterus is unremarkable in appearance. The ovaries are relatively symmetric. No suspicious adnexal masses are seen. No inguinal lymphadenopathy is seen. No acute osseous abnormalities are identified. Review of the MIP images confirms the above findings. IMPRESSION: 1. No evidence of pulmonary embolus. 2. Few tiny hazy peribronchial opacities within the upper lung lobes bilaterally, raising question for a mild acute infectious process. 3. Mild calcification along the abdominal aorta and its branches. Electronically Signed   By: Garald Balding M.D.   On: 07/05/2015 01:09   Ct Abdomen Pelvis W Contrast  07/05/2015  CLINICAL DATA:  Acute onset of epigastric abdominal pain. Black liquid stool and back pain. Chest pain and dizziness. Left foot swelling and tiredness. Initial encounter. EXAM: CT ANGIOGRAPHY CHEST CT ABDOMEN AND PELVIS WITH CONTRAST TECHNIQUE: Multidetector CT imaging of the chest was performed using the standard protocol during bolus administration of intravenous contrast. Multiplanar CT image reconstructions and MIPs were obtained to evaluate the vascular anatomy. Multidetector CT imaging of the abdomen and pelvis was performed using the standard protocol during bolus administration of intravenous contrast. CONTRAST:  151mL OMNIPAQUE IOHEXOL 350 MG/ML SOLN COMPARISON:  CT of the abdomen  and pelvis from 08/07/2012, and chest radiograph performed 06/19/2014 FINDINGS: CTA CHEST FINDINGS There is no evidence of pulmonary embolus. A few tiny hazy peribronchial opacities are noted within the upper lung lobes bilaterally, raising question for a mild acute infectious process. There is no evidence of pleural effusion or pneumothorax. No masses are identified; no abnormal focal contrast enhancement is seen. The mediastinum is unremarkable in appearance. No mediastinal lymphadenopathy is seen. No pericardial effusion is identified. The great vessels are grossly unremarkable in appearance. No axillary lymphadenopathy is seen. The visualized portions of the thyroid gland are unremarkable in appearance. No acute osseous abnormalities are seen. CT ABDOMEN and PELVIS FINDINGS The liver and spleen are unremarkable in appearance. The gallbladder is within normal limits. The pancreas and adrenal glands are unremarkable. The kidneys are unremarkable in appearance. There is no evidence of hydronephrosis. No renal or ureteral stones are seen. No perinephric stranding is appreciated. No free fluid is identified. The small bowel is unremarkable in appearance. The stomach is within normal limits. No acute vascular abnormalities are seen. Mild calcification is noted along the abdominal aorta and its branches. The patient status post appendectomy. The colon is unremarkable in appearance. The bladder is decompressed and not well assessed. The uterus is unremarkable in appearance. The ovaries are relatively symmetric. No suspicious adnexal masses are seen. No inguinal lymphadenopathy is seen. No acute osseous abnormalities are identified. Review of the MIP images confirms the above findings. IMPRESSION: 1.  No evidence of pulmonary embolus. 2. Few tiny hazy peribronchial opacities within the upper lung lobes bilaterally, raising question for a mild acute infectious process. 3. Mild calcification along the abdominal aorta and  its branches. Electronically Signed   By: Garald Balding M.D.   On: 07/05/2015 01:09    EKG:   Orders placed or performed during the hospital encounter of 07/04/15  . EKG 12-Lead  . EKG 12-Lead    IMPRESSION AND PLAN:   59 year old Caucasian female history of COPD non-oxygen dependent presenting with melena  1. GI bleed, upper: She does attests to some NSAID usage however none greater than 2 Aleve a day, trend CBC every 6 hours, transfuse hemoglobin less than 7, Protonix IV, avoid further NSAID/antiplatelet/anticoagulant, consult gastroenterology for potential endoscopy 2. COPD unspecified: Not in acute exacerbation however some evidence of potential infection on imaging combined with symptoms of cough, shortness of breath will dose azithromycin, reading treatments as required 3. Hypokalemia: Replace goal 4-5 4. Venous thromboembolism prophylactic: SCDs    All the records are reviewed and case discussed with ED provider. Management plans discussed with the patient, family and they are in agreement.  CODE STATUS: Full  TOTAL TIME TAKING CARE OF THIS PATIENT: 35 minutes.    Hower,  Karenann Cai.D on 07/05/2015 at 3:41 AM  Between 7am to 6pm - Pager - [phone]  After 6pm: House Pager: - [phone]  [facility]  Office  [phone]  CC: Primary care physician; No PCP Per Patient

## 2015-07-05 NOTE — ED Provider Notes (Signed)
[facility] Emergency Department Provider Note  ____________________________________________  Time seen: 11:50 PM  I have reviewed the triage vital signs and the nursing notes.   HISTORY  Chief Complaint Abdominal Pain; Diarrhea; and Back Pain    HPI Meagan York is a 59 y.o. female presents with multiple medical complaints including black tarry stools 2 days accompanied by nausea and epigastric abdominal pain. Patient also admits to dizziness. In addition patient also states that she's had bilateral lung pain shortness of breath. Of note the patient is currently taking Femara and she smokes a pack of cigarettes per day. Patient also admits to left lower extremity swelling and discomfort.     Past Medical History  Diagnosis Date  . Bilateral breast cysts   . Hemorrhoids   . Irritable bowel disease   . Arthritis   . COPD (chronic obstructive pulmonary disease) ([place])   . GERD (gastroesophageal reflux disease)   . Complication of anesthesia     pt woke up during breast surgery (1995)  . 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   Past Surgical History  Procedure Laterality Date  . Appendectomy    . Knee surgery    . Cyst lower spine    . Piondial cyst excision  03/1989  . Breast surgery Left     cyst removal  . Breast biopsy Right 01-01-15    BIPHASIC FIBROEPITHELIAL LESION with ADH  . Breast lumpectomy Right 04/25/2015    Procedure: RIGHT BREAST LUMPECTOMY, exicision of skin tag right face;  Surgeon: Christene Lye, MD;  Location: [facility] ORS;  Service: General;  Laterality: Right;  . Breast mammosite Right 05-21-15    Current Outpatient Rx  Name  Route  Sig  Dispense  Refill  . cholecalciferol (VITAMIN D) 1000 UNITS tablet   Oral   Take 1,000 Units by mouth daily.         Marland Kitchen letrozole (FEMARA) 2.5 MG tablet   Oral   Take 1  tablet (2.5 mg total) by mouth daily.   30 tablet   12   . ranitidine (ZANTAC) 150 MG tablet   Oral   Take 150 mg by mouth 2 (two) times daily as needed.          Marland Kitchen albuterol (PROVENTIL HFA;VENTOLIN HFA) 108 (90 BASE) MCG/ACT inhaler   Inhalation   Inhale into the lungs every 4 (four) hours as needed.          . Iron-Vitamins (GERITOL COMPLETE PO)   Oral   Take 1 tablet by mouth daily.           Allergies Tramadol  Family History  Problem Relation Age of Onset  . Colon cancer Father     age 73    Social History Social History  Substance Use Topics  . Smoking status: Current Every Day Smoker -- 1.00 packs/day for 40 years    Types: Cigarettes  . Smokeless tobacco: Never Used  . Alcohol Use: No    Review of Systems  Constitutional: Negative for fever. Eyes: Negative for visual changes. ENT: Negative for sore throat. Cardiovascular: Positive for chest pain. Respiratory: Positive for shortness of breath. Gastrointestinal: Positive for abdominal pain, positive dark stools Genitourinary: Negative for dysuria. Musculoskeletal: Negative for back pain. Skin: Negative for rash. Neurological: Negative for headaches, focal weakness or numbness.   10-point  ROS otherwise negative.  ____________________________________________   PHYSICAL EXAM:  VITAL SIGNS: ED Triage Vitals  Enc Vitals Group     BP 07/04/15 2041 108/61 mmHg     Pulse Rate 07/04/15 2043 120     Resp 07/04/15 2041 18     Temp 07/04/15 2041 98 F (36.7 C)     Temp Source 07/04/15 2041 Oral     SpO2 07/04/15 2041 99 %     Weight 07/04/15 2041 190 lb (86.183 kg)     Height 07/04/15 2041 5\' 5"  (1.651 m)     Head Cir --      Peak Flow --      Pain Score 07/04/15 2042 8     Pain Loc --      Pain Edu? --      Excl. in [place]? --      Constitutional: Alert and oriented. Well appearing and in no distress. Eyes: Conjunctivae are normal. PERRL. Normal extraocular movements. ENT   Head:  Normocephalic and atraumatic.   Nose: No congestion/rhinnorhea.   Mouth/Throat: Mucous membranes are moist.   Neck: No stridor. Cardiovascular: Normal rate, regular rhythm. Normal and symmetric distal pulses are present in all extremities. No murmurs, rubs, or gallops. Respiratory: Normal respiratory effort without tachypnea nor retractions. Breath sounds are clear and equal bilaterally. No wheezes/rales/rhonchi. Gastrointestinal: Soft and nontender. No distention. There is no CVA tenderness. Guaiac positive Genitourinary: deferred Musculoskeletal: Nontender with normal range of motion in all extremities. No joint effusions.  No lower extremity tenderness nor edema. Neurologic:  Normal speech and language. No gross focal neurologic deficits are appreciated. Speech is normal.  Skin:  Skin is warm, dry and intact. No rash noted. Psychiatric: Mood and affect are normal. Speech and behavior are normal. Patient exhibits appropriate insight and judgment.  ____________________________________________    LABS (pertinent positives/negatives) Labs Reviewed  COMPREHENSIVE METABOLIC PANEL - Abnormal; Notable for the following:    Potassium 3.4 (*)    Glucose, Bld 121 (*)    BUN 21 (*)    Albumin 3.4 (*)    Anion gap 4 (*)    All other components within normal limits  CBC - Abnormal; Notable for the following:    RBC 3.43 (*)    Hemoglobin 10.4 (*)    HCT 31.5 (*)    All other components within normal limits  URINALYSIS COMPLETEWITH MICROSCOPIC ([facility] ONLY) - Abnormal; Notable for the following:    Color, Urine STRAW (*)    APPearance CLEAR (*)    Leukocytes, UA TRACE (*)    Squamous Epithelial / LPF 0-5 (*)    All other components within normal limits  CBC - Abnormal; Notable for the following:    RBC 2.91 (*)    Hemoglobin 9.2 (*)    HCT 27.1 (*)    All other components within normal limits  CBC - Abnormal; Notable for the following:    RBC 2.79 (*)    Hemoglobin 8.5 (*)     HCT 25.6 (*)    All other components within normal limits  CBC - Abnormal; Notable for the following:    RBC 2.91 (*)    Hemoglobin 8.9 (*)    HCT 27.0 (*)    All other components within normal limits  CBC - Abnormal; Notable for the following:    RBC 2.97 (*)    Hemoglobin 9.1 (*)    HCT 27.6 (*)    All other components within normal limits  LIPASE, BLOOD  TROPONIN I     ____________________________________________   EKG ED ECG REPORT I, Tamon Parkerson, [place], the attending physician, personally viewed and interpreted this ECG.   Date: 07/08/2015  EKG Time: 8:44 PM  Rate: 117  Rhythm: Sinus tachycardia  Axis: None  Intervals: Normal  ST&T Change: None    ____________________________________________    RADIOLOGY   CT Abdomen Pelvis W Contrast (Final result) Result time: 07/05/15 01:09:09   Final result by Rad Results In Interface (07/05/15 01:09:09)   Narrative:   CLINICAL DATA: Acute onset of epigastric abdominal pain. Black liquid stool and back pain. Chest pain and dizziness. Left foot swelling and tiredness. Initial encounter.  EXAM: CT ANGIOGRAPHY CHEST  CT ABDOMEN AND PELVIS WITH CONTRAST  TECHNIQUE: Multidetector CT imaging of the chest was performed using the standard protocol during bolus administration of intravenous contrast. Multiplanar CT image reconstructions and MIPs were obtained to evaluate the vascular anatomy. Multidetector CT imaging of the abdomen and pelvis was performed using the standard protocol during bolus administration of intravenous contrast.  CONTRAST: 137mL OMNIPAQUE IOHEXOL 350 MG/ML SOLN  COMPARISON: CT of the abdomen and pelvis from 08/07/2012, and chest radiograph performed 06/19/2014  FINDINGS: CTA CHEST FINDINGS  There is no evidence of pulmonary embolus.  A few tiny hazy peribronchial opacities are noted within the upper lung lobes bilaterally, raising question for a mild acute infectious process. There  is no evidence of pleural effusion or pneumothorax. No masses are identified; no abnormal focal contrast enhancement is seen.  The mediastinum is unremarkable in appearance. No mediastinal lymphadenopathy is seen. No pericardial effusion is identified. The great vessels are grossly unremarkable in appearance. No axillary lymphadenopathy is seen. The visualized portions of the thyroid gland are unremarkable in appearance.  No acute osseous abnormalities are seen.  CT ABDOMEN and PELVIS FINDINGS  The liver and spleen are unremarkable in appearance. The gallbladder is within normal limits. The pancreas and adrenal glands are unremarkable.  The kidneys are unremarkable in appearance. There is no evidence of hydronephrosis. No renal or ureteral stones are seen. No perinephric stranding is appreciated.  No free fluid is identified. The small bowel is unremarkable in appearance. The stomach is within normal limits. No acute vascular abnormalities are seen. Mild calcification is noted along the abdominal aorta and its branches.  The patient status post appendectomy. The colon is unremarkable in appearance.  The bladder is decompressed and not well assessed. The uterus is unremarkable in appearance. The ovaries are relatively symmetric. No suspicious adnexal masses are seen. No inguinal lymphadenopathy is seen.  No acute osseous abnormalities are identified.  Review of the MIP images confirms the above findings.  IMPRESSION: 1. No evidence of pulmonary embolus. 2. Few tiny hazy peribronchial opacities within the upper lung lobes bilaterally, raising question for a mild acute infectious process. 3. Mild calcification along the abdominal aorta and its branches.   Electronically Signed By: Garald Balding M.D. On: 07/05/2015 01:09          CT Angio Chest PE W/Cm &/Or Wo Cm (Final result) Result time: 07/05/15 01:09:09   Final result by Rad Results In Interface  (07/05/15 01:09:09)   Narrative:   CLINICAL DATA: Acute onset of epigastric abdominal pain. Black liquid stool and back pain. Chest pain and dizziness. Left foot swelling and tiredness. Initial encounter.  EXAM: CT ANGIOGRAPHY CHEST  CT ABDOMEN AND PELVIS WITH CONTRAST  TECHNIQUE: Multidetector CT imaging of the chest was performed using the standard protocol during bolus administration of  intravenous contrast. Multiplanar CT image reconstructions and MIPs were obtained to evaluate the vascular anatomy. Multidetector CT imaging of the abdomen and pelvis was performed using the standard protocol during bolus administration of intravenous contrast.  CONTRAST: 130mL OMNIPAQUE IOHEXOL 350 MG/ML SOLN  COMPARISON: CT of the abdomen and pelvis from 08/07/2012, and chest radiograph performed 06/19/2014  FINDINGS: CTA CHEST FINDINGS  There is no evidence of pulmonary embolus.  A few tiny hazy peribronchial opacities are noted within the upper lung lobes bilaterally, raising question for a mild acute infectious process. There is no evidence of pleural effusion or pneumothorax. No masses are identified; no abnormal focal contrast enhancement is seen.  The mediastinum is unremarkable in appearance. No mediastinal lymphadenopathy is seen. No pericardial effusion is identified. The great vessels are grossly unremarkable in appearance. No axillary lymphadenopathy is seen. The visualized portions of the thyroid gland are unremarkable in appearance.  No acute osseous abnormalities are seen.  CT ABDOMEN and PELVIS FINDINGS  The liver and spleen are unremarkable in appearance. The gallbladder is within normal limits. The pancreas and adrenal glands are unremarkable.  The kidneys are unremarkable in appearance. There is no evidence of hydronephrosis. No renal or ureteral stones are seen. No perinephric stranding is appreciated.  No free fluid is identified. The small bowel is  unremarkable in appearance. The stomach is within normal limits. No acute vascular abnormalities are seen. Mild calcification is noted along the abdominal aorta and its branches.  The patient status post appendectomy. The colon is unremarkable in appearance.  The bladder is decompressed and not well assessed. The uterus is unremarkable in appearance. The ovaries are relatively symmetric. No suspicious adnexal masses are seen. No inguinal lymphadenopathy is seen.  No acute osseous abnormalities are identified.  Review of the MIP images confirms the above findings.  IMPRESSION: 1. No evidence of pulmonary embolus. 2. Few tiny hazy peribronchial opacities within the upper lung lobes bilaterally, raising question for a mild acute infectious process. 3. Mild calcification along the abdominal aorta and its branches.   Electronically Signed By: Garald Balding M.D. On: 07/05/2015 01:09     INITIAL IMPRESSION / ASSESSMENT AND PLAN / ED COURSE  Pertinent labs & imaging results that were available during my care of the patient were reviewed by me and considered in my medical decision making (see chart for details).  History and physical exam concerning for possible GI bleed and pulmonary emboli. Hence CT scan of the chest and abdomen and pelvis performed CT scan of chest revealed no pulmonary emboli however did reveal possible infectious process in the lungs hence IV antibiotics given. Regarding the patient's guaiac positive stool suspect possible upper GI bleed  ____________________________________________   FINAL CLINICAL IMPRESSION(S) / ED DIAGNOSES  Final diagnoses:  None  Gastrointestinal Bleeding Pneumonia    Gregor Hams, MD 07/08/15 0530

## 2015-07-05 NOTE — ED Notes (Signed)
Guiac positive.  MD informed

## 2015-07-05 NOTE — Consult Note (Signed)
GI Inpatient Consult Note  Reason for Consult: melena, anemia   Attending Requesting Consult:  Dr Lavetta Nielsen  History of Present Illness: Meagan York is a 59 y.o. female with past medical history notable for COPD, GERD who is presenting for evaluation of melena and weakness. Per the patient she was in her usual state of health until approximately 3 days ago when she developed black stools. The stools were approximately 3 times a day. The consistency was anywhere from very loose to mushy. Ever had black stools before. As occasionally seen small amounts of bright red blood on the toilet paper that she attributed to internal hemorrhoids.  She does take multiple Aleve daily chronically for foot pain. Also been having trouble with epigastric pain for the past several weeks. She has been having trouble with weakness and fatigue over the past several days and this in combination with the black stools prompted presentation to the emergency room.    She has never had EGD or colonoscopy.  She does have a father with colon cancer.  In the ED, she had a CT scan with no cause for the melena or abdominal pain seen on that study. He was noted to be anemic with a hemoglobin of 8.5. Her stool frequency has decreased since she has been receiving IV Protonix in the hospital. She had 2 stools yesterday and has yet to have a stool today.  Past Medical History:  Past Medical History  Diagnosis Date  . Bilateral breast cysts   . Hemorrhoids   . Irritable bowel disease   . Arthritis   . COPD (chronic obstructive pulmonary disease) ([place])   . GERD (gastroesophageal reflux disease)   . Complication of anesthesia     pt woke up during breast surgery (1995)  . 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 Upper GI bleed 07/05/2015    Past Surgical History: Past Surgical History  Procedure  Laterality Date  . Appendectomy    . Knee surgery    . Cyst lower spine    . Piondial cyst excision  03/1989  . Breast surgery Left     cyst removal  . Breast biopsy Right 01-01-15    BIPHASIC FIBROEPITHELIAL LESION with ADH  . Breast lumpectomy Right 04/25/2015    Procedure: RIGHT BREAST LUMPECTOMY, exicision of skin tag right face;  Surgeon: Christene Lye, MD;  Location: [facility] ORS;  Service: General;  Laterality: Right;  . Breast mammosite Right 05-21-15    Allergies: Allergies  Allergen Reactions  . Tramadol Nausea And Vomiting    Home Medications: Prescriptions prior to admission  Medication Sig Dispense Refill Last Dose  . cholecalciferol (VITAMIN D) 1000 UNITS tablet Take 1,000 Units by mouth daily.   Taking  . letrozole (FEMARA) 2.5 MG tablet Take 1 tablet (2.5 mg total) by mouth daily. 30 tablet 12 07/04/2015 at Unknown time  . albuterol (PROVENTIL HFA;VENTOLIN HFA) 108 (90 BASE) MCG/ACT inhaler Inhale into the lungs every 4 (four) hours as needed.    Taking  . Iron-Vitamins (GERITOL COMPLETE PO) Take 1 tablet by mouth daily.   Not Taking at Unknown time  . ranitidine (ZANTAC) 150 MG tablet Take 150 mg by mouth 2 (two) times daily as needed.    Not Taking at Unknown time   Home medication reconciliation was completed with the patient.  Scheduled Inpatient Medications:   . azithromycin  500 mg Oral Daily  . cholecalciferol  1,000 Units Oral Daily  . letrozole  2.5 mg Oral Daily  . multivitamin with minerals  1 tablet Oral Daily  . pantoprazole (PROTONIX) IV  40 mg Intravenous Q12H    Continuous Inpatient Infusions:   . sodium chloride 100 mL/hr at 07/05/15 1515    PRN Inpatient Medications:  acetaminophen **OR** acetaminophen, albuterol, morphine injection, nicotine, ondansetron **OR** ondansetron (ZOFRAN) IV, oxyCODONE  Family History: family history includes Colon cancer in her father.   Social History:   reports that she has been smoking Cigarettes.  She  has a 40 pack-year smoking history. She has never used smokeless tobacco. She reports that she does not drink alcohol or use illicit drugs.   Review of Systems: Constitutional: Weight is stable.  Eyes: No changes in vision. ENT: No oral lesions, sore throat.  GI: see HPI.  Heme/Lymph: No easy bruising.  CV: No chest pain.  GU: No hematuria.  Integumentary: No rashes.  Neuro: No headaches.  Psych: No depression/anxiety.  Endocrine: No heat/cold intolerance.  Allergic/Immunologic: No urticaria.  Resp: No cough, + SOB  Musculoskeletal: No joint swelling.    Physical Examination: BP 107/68 mmHg  Pulse 89  Temp(Src) 97.7 F (36.5 C) (Oral)  Resp 18  Ht 5\' 5"  (1.651 m)  Wt 81.602 kg (179 lb 14.4 oz)  BMI 29.94 kg/m2  SpO2 99% Gen: NAD, alert and oriented x 4 HEENT: PEERLA, EOMI, Neck: supple, no JVD or thyromegaly Chest: CTA bilaterally, no wheezes, crackles, or other adventitious sounds CV: RRR, no m/g/c/r Abd: soft, + epi TTP, +BS in all four quadrants; no HSM, guarding, ridigity, or rebound tenderness Ext: no edema, well perfused with 2+ pulses, Skin: no rash or lesions noted Lymph: no LAD  Data: Lab Results  Component Value Date   WBC 6.5 07/05/2015   HGB 8.5* 07/05/2015   HCT 25.6* 07/05/2015   MCV 92.0 07/05/2015   PLT 193 07/05/2015    Recent Labs Lab 07/04/15 2046 07/05/15 0544 07/05/15 1149  HGB 10.4* 9.2* 8.5*   Lab Results  Component Value Date   NA 139 07/04/2015   K 3.4* 07/04/2015   CL 108 07/04/2015   CO2 27 07/04/2015   BUN 21* 07/04/2015   CREATININE 0.61 07/04/2015   Lab Results  Component Value Date   ALT 17 07/04/2015   AST 19 07/04/2015   ALKPHOS 75 07/04/2015   BILITOT 0.4 07/04/2015   No results for input(s): APTT, INR, PTT in the last 168 hours.   Assessment/Plan: Meagan York is a 59 y.o. female presenting with melena and anemia in the setting of NSAID use. Also has epigastric abdominal pain. I suspect she has bled from a peptic  ulcer.  Recommendations: - Continue IV PPI. - monitor hemodynamics and hemoglobin, transfuse as necessary. - Upper endoscopy on Monday. - Nothing by mouth Sunday at midnight - H. pylori serology - NSAID avoidance - outpatient colonoscopy given fam hx colon cancer.  - Recommendations pending above   Thank you for the consult. Please call with questions or concerns.  Donney Caraveo, Grace Blight, MD

## 2015-07-06 MED ORDER — POLYETHYLENE GLYCOL 3350 17 GM/SCOOP PO POWD
1.0000 | Freq: Once | ORAL | Status: AC
  Administered 2015-07-06: 255 g via ORAL
  Filled 2015-07-06: qty 255

## 2015-07-06 MED ORDER — POLYETHYLENE GLYCOL 3350 17 GM/SCOOP PO POWD
1.0000 | Freq: Once | ORAL | Status: AC
  Administered 2015-07-07: 255 g via ORAL
  Filled 2015-07-06: qty 255

## 2015-07-06 NOTE — Progress Notes (Signed)
[place] [facility] [place] NAME: Meagan York    MR#:  [id number]  DATE OF BIRTH:  [birth year]  SUBJECTIVE:  CHIEF COMPLAINT:   Chief Complaint  Patient presents with  . Abdominal Pain  . Diarrhea  . Back Pain   Came with dark stool, takes advil daily twice. No more BM today, Hb stable, no nausea, some epigastric pain.  REVIEW OF SYSTEMS:  CONSTITUTIONAL: No fever, fatigue or weakness.  EYES: No blurred or double vision.  EARS, NOSE, AND THROAT: No tinnitus or ear pain.  RESPIRATORY: No cough, shortness of breath, wheezing or hemoptysis.  CARDIOVASCULAR: No chest pain, orthopnea, edema.  GASTROINTESTINAL: No nausea, vomiting, diarrhea, some abdominal pain.  GENITOURINARY: No dysuria, hematuria.  ENDOCRINE: No polyuria, nocturia,  HEMATOLOGY: No anemia, easy bruising or bleeding SKIN: No rash or lesion. MUSCULOSKELETAL: No joint pain or arthritis.   NEUROLOGIC: No tingling, numbness, weakness.  PSYCHIATRY: No anxiety or depression.   ROS  DRUG ALLERGIES:   Allergies  Allergen Reactions  . Tramadol Nausea And Vomiting    VITALS:  Blood pressure 109/71, pulse 81, temperature 97.7 F (36.5 C), temperature source Oral, resp. rate 18, height 5\' 5"  (1.651 m), weight 81.602 kg (179 lb 14.4 oz), SpO2 99 %.  PHYSICAL EXAMINATION:  GENERAL:  59 y.o.-year-old patient lying in the bed with no acute distress.  EYES: Pupils equal, round, reactive to light and accommodation. No scleral icterus. Extraocular muscles intact.  HEENT: Head atraumatic, normocephalic. Oropharynx and nasopharynx clear.  NECK:  Supple, no jugular venous distention. No thyroid enlargement, no tenderness.  LUNGS: Normal breath sounds bilaterally, no wheezing, rales,rhonchi or crepitation. No use of accessory muscles of respiration.  CARDIOVASCULAR: S1, S2 normal. No murmurs, rubs, or gallops.  ABDOMEN: Soft, mild epigastric tender, nondistended. Bowel sounds present.  No organomegaly or mass.  EXTREMITIES: No pedal edema, cyanosis, or clubbing.  NEUROLOGIC: Cranial nerves II through XII are intact. Muscle strength 5/5 in all extremities. Sensation intact. Gait not checked.  PSYCHIATRIC: The patient is alert and oriented x 3.  SKIN: No obvious rash, lesion, or ulcer.   Physical Exam LABORATORY PANEL:   CBC  Recent Labs Lab 07/05/15 2217  WBC 7.9  HGB 9.1*  HCT 27.6*  PLT 235   ------------------------------------------------------------------------------------------------------------------  Chemistries   Recent Labs Lab 07/04/15 2046  NA 139  K 3.4*  CL 108  CO2 27  GLUCOSE 121*  BUN 21*  CREATININE 0.61  CALCIUM 9.1  AST 19  ALT 17  ALKPHOS 75  BILITOT 0.4   ------------------------------------------------------------------------------------------------------------------  Cardiac Enzymes  Recent Labs Lab 07/04/15 2046  TROPONINI <0.03   ------------------------------------------------------------------------------------------------------------------  RADIOLOGY:  Ct Angio Chest Pe W/cm &/or Wo Cm  07/05/2015  CLINICAL DATA:  Acute onset of epigastric abdominal pain. Black liquid stool and back pain. Chest pain and dizziness. Left foot swelling and tiredness. Initial encounter. EXAM: CT ANGIOGRAPHY CHEST CT ABDOMEN AND PELVIS WITH CONTRAST TECHNIQUE: Multidetector CT imaging of the chest was performed using the standard protocol during bolus administration of intravenous contrast. Multiplanar CT image reconstructions and MIPs were obtained to evaluate the vascular anatomy. Multidetector CT imaging of the abdomen and pelvis was performed using the standard protocol during bolus administration of intravenous contrast. CONTRAST:  177mL OMNIPAQUE IOHEXOL 350 MG/ML SOLN COMPARISON:  CT of the abdomen and pelvis from 08/07/2012, and chest radiograph performed 06/19/2014 FINDINGS: CTA CHEST FINDINGS There is no evidence of pulmonary  embolus. A few tiny hazy  peribronchial opacities are noted within the upper lung lobes bilaterally, raising question for a mild acute infectious process. There is no evidence of pleural effusion or pneumothorax. No masses are identified; no abnormal focal contrast enhancement is seen. The mediastinum is unremarkable in appearance. No mediastinal lymphadenopathy is seen. No pericardial effusion is identified. The great vessels are grossly unremarkable in appearance. No axillary lymphadenopathy is seen. The visualized portions of the thyroid gland are unremarkable in appearance. No acute osseous abnormalities are seen. CT ABDOMEN and PELVIS FINDINGS The liver and spleen are unremarkable in appearance. The gallbladder is within normal limits. The pancreas and adrenal glands are unremarkable. The kidneys are unremarkable in appearance. There is no evidence of hydronephrosis. No renal or ureteral stones are seen. No perinephric stranding is appreciated. No free fluid is identified. The small bowel is unremarkable in appearance. The stomach is within normal limits. No acute vascular abnormalities are seen. Mild calcification is noted along the abdominal aorta and its branches. The patient status post appendectomy. The colon is unremarkable in appearance. The bladder is decompressed and not well assessed. The uterus is unremarkable in appearance. The ovaries are relatively symmetric. No suspicious adnexal masses are seen. No inguinal lymphadenopathy is seen. No acute osseous abnormalities are identified. Review of the MIP images confirms the above findings. IMPRESSION: 1. No evidence of pulmonary embolus. 2. Few tiny hazy peribronchial opacities within the upper lung lobes bilaterally, raising question for a mild acute infectious process. 3. Mild calcification along the abdominal aorta and its branches. Electronically Signed   By: Garald Balding M.D.   On: 07/05/2015 01:09   Ct Abdomen Pelvis W Contrast  07/05/2015   CLINICAL DATA:  Acute onset of epigastric abdominal pain. Black liquid stool and back pain. Chest pain and dizziness. Left foot swelling and tiredness. Initial encounter. EXAM: CT ANGIOGRAPHY CHEST CT ABDOMEN AND PELVIS WITH CONTRAST TECHNIQUE: Multidetector CT imaging of the chest was performed using the standard protocol during bolus administration of intravenous contrast. Multiplanar CT image reconstructions and MIPs were obtained to evaluate the vascular anatomy. Multidetector CT imaging of the abdomen and pelvis was performed using the standard protocol during bolus administration of intravenous contrast. CONTRAST:  181mL OMNIPAQUE IOHEXOL 350 MG/ML SOLN COMPARISON:  CT of the abdomen and pelvis from 08/07/2012, and chest radiograph performed 06/19/2014 FINDINGS: CTA CHEST FINDINGS There is no evidence of pulmonary embolus. A few tiny hazy peribronchial opacities are noted within the upper lung lobes bilaterally, raising question for a mild acute infectious process. There is no evidence of pleural effusion or pneumothorax. No masses are identified; no abnormal focal contrast enhancement is seen. The mediastinum is unremarkable in appearance. No mediastinal lymphadenopathy is seen. No pericardial effusion is identified. The great vessels are grossly unremarkable in appearance. No axillary lymphadenopathy is seen. The visualized portions of the thyroid gland are unremarkable in appearance. No acute osseous abnormalities are seen. CT ABDOMEN and PELVIS FINDINGS The liver and spleen are unremarkable in appearance. The gallbladder is within normal limits. The pancreas and adrenal glands are unremarkable. The kidneys are unremarkable in appearance. There is no evidence of hydronephrosis. No renal or ureteral stones are seen. No perinephric stranding is appreciated. No free fluid is identified. The small bowel is unremarkable in appearance. The stomach is within normal limits. No acute vascular abnormalities are seen.  Mild calcification is noted along the abdominal aorta and its branches. The patient status post appendectomy. The colon is unremarkable in appearance. The bladder is decompressed and not  well assessed. The uterus is unremarkable in appearance. The ovaries are relatively symmetric. No suspicious adnexal masses are seen. No inguinal lymphadenopathy is seen. No acute osseous abnormalities are identified. Review of the MIP images confirms the above findings. IMPRESSION: 1. No evidence of pulmonary embolus. 2. Few tiny hazy peribronchial opacities within the upper lung lobes bilaterally, raising question for a mild acute infectious process. 3. Mild calcification along the abdominal aorta and its branches. Electronically Signed   By: Garald Balding M.D.   On: 07/05/2015 01:09    ASSESSMENT AND PLAN:   Principal Problem:   Upper GI bleed   59 year old Caucasian female history of COPD non-oxygen dependent presenting with melena  1. GI bleed, upper: She does attests to some NSAID usage however none greater than 2 Aleve a day,   Protonix IV, avoid further NSAID/antiplatelet/anticoagulant, consult gastroenterology for potential endoscopy  Hb stable. 2. COPD unspecified: Not in acute exacerbation however some evidence of potential infection on imaging combined with symptoms of cough, shortness of breath will dose azithromycin. 3. Hypokalemia: Replace goal 4-5 4. Venous thromboembolism prophylactic: SCDs   All the records are reviewed and case discussed with Care Management/Social Workerr. Management plans discussed with the patient, family and they are in agreement.  CODE STATUS: full.  TOTAL TIME TAKING CARE OF THIS PATIENT: 35 minutes.     POSSIBLE D/C IN 1-2 DAYS, DEPENDING ON CLINICAL CONDITION.   Vaughan Basta M.D on 07/06/2015   Between 7am to 6pm - Pager - [phone]  After 6pm go to [email or web address] - password [email or web address] [facility]  [facility]  Office   [phone]  CC: Primary care physician; No PCP Per Patient  Note: This dictation was prepared with Dragon dictation along with smaller phrase technology. Any transcriptional errors that result from this process are unintentional.

## 2015-07-06 NOTE — Progress Notes (Signed)
GI Inpatient Follow-up Note  Patient Identification: DELCIA SPITZLEY is a 59 y.o. female with melena, anenmia, egpigastric pain.   Subjective:  No more stool since presentation.  Abd pain improving.  No n/v,  No f/c, no rectal bleeding, no melena.   Scheduled Inpatient Medications:  . azithromycin  500 mg Oral Daily  . cholecalciferol  1,000 Units Oral Daily  . letrozole  2.5 mg Oral Daily  . multivitamin with minerals  1 tablet Oral Daily  . pantoprazole (PROTONIX) IV  40 mg Intravenous Q12H    Continuous Inpatient Infusions:   . sodium chloride 100 mL/hr at 07/06/15 1441    PRN Inpatient Medications:  acetaminophen **OR** acetaminophen, albuterol, morphine injection, nicotine, ondansetron **OR** ondansetron (ZOFRAN) IV, oxyCODONE    Physical Examination: BP 116/74 mmHg  Pulse 88  Temp(Src) 97.8 F (36.6 C) (Oral)  Resp 16  Ht 5\' 5"  (1.651 m)  Wt 81.602 kg (179 lb 14.4 oz)  BMI 29.94 kg/m2  SpO2 98% Gen: NAD, alert and oriented x 4 HEENT: PEERLA, EOMI, Neck: supple, no JVD or thyromegaly Chest: CTA bilaterally, no wheezes, crackles, or other adventitious sounds CV: RRR, no m/g/c/r Abd: + epi TTP,  +BS in all four quadrants; no HSM, guarding, ridigity, or rebound tenderness Ext: no edema, well perfused with 2+ pulses, Skin: no rash or lesions noted Lymph: no LAD  Data: Lab Results  Component Value Date   WBC 7.9 07/05/2015   HGB 9.1* 07/05/2015   HCT 27.6* 07/05/2015   MCV 92.9 07/05/2015   PLT 235 07/05/2015    Recent Labs Lab 07/05/15 1149 07/05/15 1624 07/05/15 2217  HGB 8.5* 8.9* 9.1*   Lab Results  Component Value Date   NA 139 07/04/2015   K 3.4* 07/04/2015   CL 108 07/04/2015   CO2 27 07/04/2015   BUN 21* 07/04/2015   CREATININE 0.61 07/04/2015   Lab Results  Component Value Date   ALT 17 07/04/2015   AST 19 07/04/2015   ALKPHOS 75 07/04/2015   BILITOT 0.4 07/04/2015   No results for input(s): APTT, INR, PTT in the last 168 hours.    Assessment/Plan: Ms. Oelke is a 59 y.o. female with melena, epi pain, anemia on nsaids. Likely PUD.  Hgb now stable and no further bleeding  Recommendations: - EGD / colon tomorrow afternoon - EGD for melena, anemia, epi pain - Colon for fam hx colon ca, no prev colon - miralax 255 gr tonight and 255 grams in am.  - npo after miralax in am.  - likely d/c home after procedures tomorrow.     Please call with questions or concerns.  REIN, Grace Blight, MD

## 2015-07-06 NOTE — Progress Notes (Signed)
[place] at [place] NAME: Meagan York    MR#:  [id number]  DATE OF BIRTH:  04-11-1956  SUBJECTIVE:  CHIEF COMPLAINT:   Chief Complaint  Patient presents with  . Abdominal Pain  . Diarrhea  . Back Pain   Came with dark stool, takes advil daily twice. No more BM today, Hb stable, no nausea, some epigastric pain. Planned for Endoscopy tomorrow am.  REVIEW OF SYSTEMS:  CONSTITUTIONAL: No fever, fatigue or weakness.  EYES: No blurred or double vision.  EARS, NOSE, AND THROAT: No tinnitus or ear pain.  RESPIRATORY: No cough, shortness of breath, wheezing or hemoptysis.  CARDIOVASCULAR: No chest pain, orthopnea, edema.  GASTROINTESTINAL: No nausea, vomiting, diarrhea, some abdominal pain.  GENITOURINARY: No dysuria, hematuria.  ENDOCRINE: No polyuria, nocturia,  HEMATOLOGY: No anemia, easy bruising or bleeding SKIN: No rash or lesion. MUSCULOSKELETAL: No joint pain or arthritis.   NEUROLOGIC: No tingling, numbness, weakness.  PSYCHIATRY: No anxiety or depression.   ROS  DRUG ALLERGIES:   Allergies  Allergen Reactions  . Tramadol Nausea And Vomiting    VITALS:  Blood pressure 109/71, pulse 81, temperature 97.7 F (36.5 C), temperature source Oral, resp. rate 18, height 5\' 5"  (1.651 m), weight 81.602 kg (179 lb 14.4 oz), SpO2 99 %.  PHYSICAL EXAMINATION:  GENERAL:  59 y.o.-year-old patient lying in the bed with no acute distress.  EYES: Pupils equal, round, reactive to light and accommodation. No scleral icterus. Extraocular muscles intact.  HEENT: Head atraumatic, normocephalic. Oropharynx and nasopharynx clear.  NECK:  Supple, no jugular venous distention. No thyroid enlargement, no tenderness.  LUNGS: Normal breath sounds bilaterally, no wheezing, rales,rhonchi or crepitation. No use of accessory muscles of respiration.  CARDIOVASCULAR: S1, S2 normal. No murmurs, rubs, or gallops.  ABDOMEN: Soft, mild epigastric tender,  nondistended. Bowel sounds present. No organomegaly or mass.  EXTREMITIES: No pedal edema, cyanosis, or clubbing.  NEUROLOGIC: Cranial nerves II through XII are intact. Muscle strength 5/5 in all extremities. Sensation intact. Gait not checked.  PSYCHIATRIC: The patient is alert and oriented x 3.  SKIN: No obvious rash, lesion, or ulcer.   Physical Exam LABORATORY PANEL:   CBC  Recent Labs Lab 07/05/15 2217  WBC 7.9  HGB 9.1*  HCT 27.6*  PLT 235   ------------------------------------------------------------------------------------------------------------------  Chemistries   Recent Labs Lab 07/04/15 2046  NA 139  K 3.4*  CL 108  CO2 27  GLUCOSE 121*  BUN 21*  CREATININE 0.61  CALCIUM 9.1  AST 19  ALT 17  ALKPHOS 75  BILITOT 0.4   ------------------------------------------------------------------------------------------------------------------  Cardiac Enzymes  Recent Labs Lab 07/04/15 2046  TROPONINI <0.03   ------------------------------------------------------------------------------------------------------------------  RADIOLOGY:  Ct Angio Chest Pe W/cm &/or Wo Cm  07/05/2015  CLINICAL DATA:  Acute onset of epigastric abdominal pain. Black liquid stool and back pain. Chest pain and dizziness. Left foot swelling and tiredness. Initial encounter. EXAM: CT ANGIOGRAPHY CHEST CT ABDOMEN AND PELVIS WITH CONTRAST TECHNIQUE: Multidetector CT imaging of the chest was performed using the standard protocol during bolus administration of intravenous contrast. Multiplanar CT image reconstructions and MIPs were obtained to evaluate the vascular anatomy. Multidetector CT imaging of the abdomen and pelvis was performed using the standard protocol during bolus administration of intravenous contrast. CONTRAST:  163mL OMNIPAQUE IOHEXOL 350 MG/ML SOLN COMPARISON:  CT of the abdomen and pelvis from 08/07/2012, and chest radiograph performed 06/19/2014 FINDINGS: CTA CHEST FINDINGS  There is no evidence of pulmonary  embolus. A few tiny hazy peribronchial opacities are noted within the upper lung lobes bilaterally, raising question for a mild acute infectious process. There is no evidence of pleural effusion or pneumothorax. No masses are identified; no abnormal focal contrast enhancement is seen. The mediastinum is unremarkable in appearance. No mediastinal lymphadenopathy is seen. No pericardial effusion is identified. The great vessels are grossly unremarkable in appearance. No axillary lymphadenopathy is seen. The visualized portions of the thyroid gland are unremarkable in appearance. No acute osseous abnormalities are seen. CT ABDOMEN and PELVIS FINDINGS The liver and spleen are unremarkable in appearance. The gallbladder is within normal limits. The pancreas and adrenal glands are unremarkable. The kidneys are unremarkable in appearance. There is no evidence of hydronephrosis. No renal or ureteral stones are seen. No perinephric stranding is appreciated. No free fluid is identified. The small bowel is unremarkable in appearance. The stomach is within normal limits. No acute vascular abnormalities are seen. Mild calcification is noted along the abdominal aorta and its branches. The patient status post appendectomy. The colon is unremarkable in appearance. The bladder is decompressed and not well assessed. The uterus is unremarkable in appearance. The ovaries are relatively symmetric. No suspicious adnexal masses are seen. No inguinal lymphadenopathy is seen. No acute osseous abnormalities are identified. Review of the MIP images confirms the above findings. IMPRESSION: 1. No evidence of pulmonary embolus. 2. Few tiny hazy peribronchial opacities within the upper lung lobes bilaterally, raising question for a mild acute infectious process. 3. Mild calcification along the abdominal aorta and its branches. Electronically Signed   By: Garald Balding M.D.   On: 07/05/2015 01:09   Ct Abdomen  Pelvis W Contrast  07/05/2015  CLINICAL DATA:  Acute onset of epigastric abdominal pain. Black liquid stool and back pain. Chest pain and dizziness. Left foot swelling and tiredness. Initial encounter. EXAM: CT ANGIOGRAPHY CHEST CT ABDOMEN AND PELVIS WITH CONTRAST TECHNIQUE: Multidetector CT imaging of the chest was performed using the standard protocol during bolus administration of intravenous contrast. Multiplanar CT image reconstructions and MIPs were obtained to evaluate the vascular anatomy. Multidetector CT imaging of the abdomen and pelvis was performed using the standard protocol during bolus administration of intravenous contrast. CONTRAST:  134mL OMNIPAQUE IOHEXOL 350 MG/ML SOLN COMPARISON:  CT of the abdomen and pelvis from 08/07/2012, and chest radiograph performed 06/19/2014 FINDINGS: CTA CHEST FINDINGS There is no evidence of pulmonary embolus. A few tiny hazy peribronchial opacities are noted within the upper lung lobes bilaterally, raising question for a mild acute infectious process. There is no evidence of pleural effusion or pneumothorax. No masses are identified; no abnormal focal contrast enhancement is seen. The mediastinum is unremarkable in appearance. No mediastinal lymphadenopathy is seen. No pericardial effusion is identified. The great vessels are grossly unremarkable in appearance. No axillary lymphadenopathy is seen. The visualized portions of the thyroid gland are unremarkable in appearance. No acute osseous abnormalities are seen. CT ABDOMEN and PELVIS FINDINGS The liver and spleen are unremarkable in appearance. The gallbladder is within normal limits. The pancreas and adrenal glands are unremarkable. The kidneys are unremarkable in appearance. There is no evidence of hydronephrosis. No renal or ureteral stones are seen. No perinephric stranding is appreciated. No free fluid is identified. The small bowel is unremarkable in appearance. The stomach is within normal limits. No acute  vascular abnormalities are seen. Mild calcification is noted along the abdominal aorta and its branches. The patient status post appendectomy. The colon is unremarkable in appearance. The  bladder is decompressed and not well assessed. The uterus is unremarkable in appearance. The ovaries are relatively symmetric. No suspicious adnexal masses are seen. No inguinal lymphadenopathy is seen. No acute osseous abnormalities are identified. Review of the MIP images confirms the above findings. IMPRESSION: 1. No evidence of pulmonary embolus. 2. Few tiny hazy peribronchial opacities within the upper lung lobes bilaterally, raising question for a mild acute infectious process. 3. Mild calcification along the abdominal aorta and its branches. Electronically Signed   By: Garald Balding M.D.   On: 07/05/2015 01:09    ASSESSMENT AND PLAN:   Principal Problem:   Upper GI bleed   59 year old Caucasian female history of COPD non-oxygen dependent presenting with melena  1. GI bleed, upper: She does attests to some NSAID usage however none greater than 2 Aleve a day,   Protonix IV, avoid further NSAID/antiplatelet/anticoagulant, consult gastroenterology for potential endoscopy  Hb stable.   GI planned for endoscopy tomorrow. 2. COPD unspecified: Not in acute exacerbation however some evidence of potential infection on imaging combined with symptoms of cough, shortness of breath will dose azithromycin. 3. Hypokalemia: Replace goal 4-5 4. Venous thromboembolism prophylactic: SCDs 5. Chronic joint pains- she is requesting me for non- NSAIDs prescriptions to avoid GI issues in future.     Will give on d/c.    I also advised to follow with ortho clinic.  All the records are reviewed and case discussed with Care Management/Social Workerr. Management plans discussed with the patient, family and they are in agreement.  CODE STATUS: full.  TOTAL TIME TAKING CARE OF THIS PATIENT: 35 minutes.     POSSIBLE D/C IN  1-2 DAYS, DEPENDING ON CLINICAL CONDITION.   Vaughan Basta M.D on 07/06/2015   Between 7am to 6pm - Pager - [phone]  After 6pm go to [email or web address] - password [email or web address] [facility]  [place] [facility]  Office  [phone]  CC: Primary care physician; No PCP Per Patient  Note: This dictation was prepared with Dragon dictation along with smaller phrase technology. Any transcriptional errors that result from this process are unintentional.

## 2015-07-07 ENCOUNTER — Encounter
Admission: EM | Disposition: A | Discharge status: Home / Self Care | Payer: Self-pay | Source: Home / Self Care | Attending: Internal Medicine

## 2015-07-07 ENCOUNTER — Inpatient Hospital Stay: Payer: Managed Care, Other (non HMO) | Admitting: Anesthesiology

## 2015-07-07 ENCOUNTER — Encounter: Payer: Self-pay | Admitting: Anesthesiology

## 2015-07-07 HISTORY — PX: ESOPHAGOGASTRODUODENOSCOPY (EGD) WITH PROPOFOL: SHX5813

## 2015-07-07 HISTORY — PX: COLONOSCOPY WITH PROPOFOL: SHX5780

## 2015-07-07 SURGERY — ESOPHAGOGASTRODUODENOSCOPY (EGD) WITH PROPOFOL
Anesthesia: General

## 2015-07-07 MED ORDER — OXYCODONE HCL 5 MG PO TABS: 5.0000 mg | ORAL_TABLET | Freq: Three times a day (TID) | ORAL | Status: DC | PRN

## 2015-07-07 MED ORDER — GLYCOPYRROLATE 0.2 MG/ML IJ SOLN
INTRAMUSCULAR | Status: DC | PRN
  Administered 2015-07-07: 0.2 mg via INTRAVENOUS

## 2015-07-07 MED ORDER — PANTOPRAZOLE SODIUM 40 MG PO TBEC: 40.0000 mg | DELAYED_RELEASE_TABLET | Freq: Two times a day (BID) | ORAL | Status: DC

## 2015-07-07 MED ORDER — PROPOFOL 500 MG/50ML IV EMUL
INTRAVENOUS | Status: DC | PRN
  Administered 2015-07-07: 140 ug/kg/min via INTRAVENOUS

## 2015-07-07 MED ORDER — PROPOFOL 10 MG/ML IV BOLUS
INTRAVENOUS | Status: DC | PRN
  Administered 2015-07-07: 20 mg via INTRAVENOUS
  Administered 2015-07-07: 70 mg via INTRAVENOUS

## 2015-07-07 MED ORDER — PNEUMOCOCCAL VAC POLYVALENT 25 MCG/0.5ML IJ INJ: 0.5000 mL | INJECTION | INTRAMUSCULAR | Status: DC

## 2015-07-07 MED ORDER — SODIUM CHLORIDE 0.9 % IV SOLN: INTRAVENOUS | Status: DC

## 2015-07-07 MED ORDER — SODIUM CHLORIDE 0.9 % IV SOLN
INTRAVENOUS | Status: DC
  Administered 2015-07-07: 05:00:00 via INTRAVENOUS

## 2015-07-07 MED ORDER — LIDOCAINE HCL (CARDIAC) 20 MG/ML IV SOLN
INTRAVENOUS | Status: DC | PRN
  Administered 2015-07-07: 50 mg via INTRAVENOUS

## 2015-07-07 NOTE — Op Note (Signed)
[facility] Gastroenterology Patient Name: Meagan York Procedure Date: 07/07/2015 3:09 PM MRN: [id number] Account #: [id number] Date of Birth: Jan 21, 1956 Admit Type: Inpatient Age: 59 Room: [facility] ENDO ROOM 2 Gender: Female Note Status: Finalized Procedure:         Upper GI endoscopy Indications:       Epigastric abdominal pain, Iron deficiency anemia, Melena Patient Profile:   This is a 59 year old female. Providers:         Gerrit Heck. Rayann Heman, MD Medicines:         Propofol per Anesthesia Complications:     No immediate complications. Procedure:         Pre-Anesthesia Assessment:                    - Prior to the procedure, a History and Physical was                     performed, and patient medications, allergies and                     sensitivities were reviewed. The patient's tolerance of                     previous anesthesia was reviewed.                    After obtaining informed consent, the endoscope was passed                     under direct vision. Throughout the procedure, the                     patient's blood pressure, pulse, and oxygen saturations                     were monitored continuously. The Endoscope was introduced                     through the mouth, and advanced to the second part of                     duodenum. The upper GI endoscopy was accomplished without                     difficulty. The patient tolerated the procedure well. Findings:      A widely patent Schatzki ring (acquired) was found at the       gastroesophageal junction.      Three non-bleeding superficial gastric ulcers with no stigmata of       bleeding were found in the gastric antrum. The largest lesion was 8 mm       in largest dimension.      The examined duodenum was normal. Impression:        - Widely patent Schatzki ring.                    - Gastric ulcers with clean base.                    - Normal examined duodenum.                    - No specimens  collected. Recommendation:    - Perform a colonoscopy.                    -  Continue present medications.                    - No ibuprofen, naproxen, or other non-steroidal                     anti-inflammatory drugs.                    - Use Prilosec (omeprazole) 40 mg PO BID for 8 weeks.                    - Safe for discharge from GI bleed standpoint                    - The findings and recommendations were discussed with the                     patient. Procedure Code(s): --- Professional ---                    [phone], Esophagogastroduodenoscopy, flexible, transoral;                     diagnostic, including collection of specimen(s) by                     brushing or washing, when performed (separate procedure) CPT copyright 2014 American Medical Association. All rights reserved. The codes documented in this report are preliminary and upon coder review may  be revised to meet current compliance requirements. Mellody Life, MD 07/07/2015 3:17:35 PM This report has been signed electronically. Number of Addenda: 0 Note Initiated On: 07/07/2015 3:09 PM      [facility]

## 2015-07-07 NOTE — Transfer of Care (Signed)
Immediate Anesthesia Transfer of Care Note  Patient: Meagan York  Procedure(s) Performed: Procedure(s): ESOPHAGOGASTRODUODENOSCOPY (EGD) WITH PROPOFOL (N/A) COLONOSCOPY WITH PROPOFOL (N/A)  Patient Location: PACU and Endoscopy Unit  Anesthesia Type:General  Level of Consciousness: sedated  Airway & Oxygen Therapy: Patient Spontanous Breathing and Patient connected to nasal cannula oxygen  Post-op Assessment: Report given to RN and Post -op Vital signs reviewed and stable  Post vital signs: Reviewed and stable  Last Vitals:  Filed Vitals:   07/07/15 1546  BP: 112/81  Pulse:   Temp: 36.2 C  Resp: 18    Complications: No apparent anesthesia complications

## 2015-07-07 NOTE — Discharge Instructions (Signed)
No over the counter pain meds.

## 2015-07-07 NOTE — H&P (View-Only) (Signed)
GI Inpatient Follow-up Note  Patient Identification: Meagan York is a 59 y.o. female with melena, anenmia, egpigastric pain.   Subjective:  No more stool since presentation.  Abd pain improving.  No n/v,  No f/c, no rectal bleeding, no melena.   Scheduled Inpatient Medications:  . azithromycin  500 mg Oral Daily  . cholecalciferol  1,000 Units Oral Daily  . letrozole  2.5 mg Oral Daily  . multivitamin with minerals  1 tablet Oral Daily  . pantoprazole (PROTONIX) IV  40 mg Intravenous Q12H    Continuous Inpatient Infusions:   . sodium chloride 100 mL/hr at 07/06/15 1441    PRN Inpatient Medications:  acetaminophen **OR** acetaminophen, albuterol, morphine injection, nicotine, ondansetron **OR** ondansetron (ZOFRAN) IV, oxyCODONE    Physical Examination: BP 116/74 mmHg  Pulse 88  Temp(Src) 97.8 F (36.6 C) (Oral)  Resp 16  Ht 5\' 5"  (1.651 m)  Wt 81.602 kg (179 lb 14.4 oz)  BMI 29.94 kg/m2  SpO2 98% Gen: NAD, alert and oriented x 4 HEENT: PEERLA, EOMI, Neck: supple, no JVD or thyromegaly Chest: CTA bilaterally, no wheezes, crackles, or other adventitious sounds CV: RRR, no m/g/c/r Abd: + epi TTP,  +BS in all four quadrants; no HSM, guarding, ridigity, or rebound tenderness Ext: no edema, well perfused with 2+ pulses, Skin: no rash or lesions noted Lymph: no LAD  Data: Lab Results  Component Value Date   WBC 7.9 07/05/2015   HGB 9.1* 07/05/2015   HCT 27.6* 07/05/2015   MCV 92.9 07/05/2015   PLT 235 07/05/2015    Recent Labs Lab 07/05/15 1149 07/05/15 1624 07/05/15 2217  HGB 8.5* 8.9* 9.1*   Lab Results  Component Value Date   NA 139 07/04/2015   K 3.4* 07/04/2015   CL 108 07/04/2015   CO2 27 07/04/2015   BUN 21* 07/04/2015   CREATININE 0.61 07/04/2015   Lab Results  Component Value Date   ALT 17 07/04/2015   AST 19 07/04/2015   ALKPHOS 75 07/04/2015   BILITOT 0.4 07/04/2015   No results for input(s): APTT, INR, PTT in the last 168 hours.    Assessment/Plan: Meagan York is a 58 y.o. female with melena, epi pain, anemia on nsaids. Likely PUD.  Hgb now stable and no further bleeding  Recommendations: - EGD / colon tomorrow afternoon - EGD for melena, anemia, epi pain - Colon for fam hx colon ca, no prev colon - miralax 255 gr tonight and 255 grams in am.  - npo after miralax in am.  - likely d/c home after procedures tomorrow.     Please call with questions or concerns.  REIN, Grace Blight, MD

## 2015-07-07 NOTE — Interval H&P Note (Signed)
History and Physical Interval Note:  07/07/2015 2:50 PM  Meagan York  has presented today for surgery, with the diagnosis of melena, anemia  The various methods of treatment have been discussed with the patient and family. After consideration of risks, benefits and other options for treatment, the patient has consented to  Procedure(s): ESOPHAGOGASTRODUODENOSCOPY (EGD) WITH PROPOFOL (N/A) COLONOSCOPY WITH PROPOFOL (N/A) as a surgical intervention .  The patient's history has been reviewed, patient examined, no change in status, stable for surgery.  I have reviewed the patient's chart and labs.  Questions were answered to the patient's satisfaction.     REIN, MATTHEW GORDON

## 2015-07-07 NOTE — Progress Notes (Signed)
07/07/2015 6:45 PM [name] to be D/C'd Home per MD order.  Discussed prescriptions and follow up appointments with the patient. Prescriptions given to patient, medication list explained in detail. Pt verbalized understanding.    Medication List    STOP taking these medications        ranitidine 150 MG tablet  Commonly known as:  ZANTAC      TAKE these medications        albuterol 108 (90 BASE) MCG/ACT inhaler  Commonly known as:  PROVENTIL HFA;VENTOLIN HFA  Inhale into the lungs every 4 (four) hours as needed.     cholecalciferol 1000 UNITS tablet  Commonly known as:  VITAMIN D  Take 1,000 Units by mouth daily.     GERITOL COMPLETE PO  Take 1 tablet by mouth daily.     letrozole 2.5 MG tablet  Commonly known as:  FEMARA  Take 1 tablet (2.5 mg total) by mouth daily.     oxyCODONE 5 MG immediate release tablet  Commonly known as:  Oxy IR/ROXICODONE  Take 1 tablet (5 mg total) by mouth 3 (three) times daily as needed for moderate pain.     pantoprazole 40 MG tablet  Commonly known as:  PROTONIX  Take 1 tablet (40 mg total) by mouth 2 (two) times daily.        Filed Vitals:   07/07/15 1712  BP: 140/93  Pulse: 82  Temp: 98.2 F (36.8 C)  Resp: 18    Skin clean, dry and intact without evidence of skin break down, no evidence of skin tears noted. IV catheter discontinued intact. Site without signs and symptoms of complications. Dressing and pressure applied. Pt denies pain at this time. No complaints noted.  An After Visit Summary was printed and given to the patient. Patient escorted out and D/C home via private auto.  Dola Argyle

## 2015-07-07 NOTE — Anesthesia Procedure Notes (Signed)
Date/Time: 07/07/2015 3:01 PM Performed by: Doreen Salvage Pre-anesthesia Checklist: Patient identified, Emergency Drugs available, Suction available and Patient being monitored Patient Re-evaluated:Patient Re-evaluated prior to inductionOxygen Delivery Method: Nasal cannula Intubation Type: IV induction Dental Injury: Teeth and Oropharynx as per pre-operative assessment  Comments: Nasal cannula with etCO2 monitoring

## 2015-07-07 NOTE — Anesthesia Preprocedure Evaluation (Signed)
Anesthesia Evaluation  Patient identified by MRN, date of birth, ID band Patient awake    Reviewed: Allergy & Precautions, H&P , NPO status , Patient's Chart, lab work & pertinent test results, reviewed documented beta blocker date and time   History of Anesthesia Complications (+) AWARENESS UNDER ANESTHESIA and history of anesthetic complications  Airway Mallampati: III  TM Distance: >3 FB Neck ROM: full    Dental no notable dental hx. (+) Poor Dentition   Pulmonary shortness of breath and with exertion, neg sleep apnea, COPD, neg recent URI, Current Smoker,    Pulmonary exam normal breath sounds clear to auscultation       Cardiovascular Exercise Tolerance: Good negative cardio ROS Normal cardiovascular exam Rhythm:regular Rate:Normal     Neuro/Psych negative neurological ROS  negative psych ROS   GI/Hepatic Neg liver ROS, GERD  Medicated,  Endo/Other  negative endocrine ROS  Renal/GU negative Renal ROS  negative genitourinary   Musculoskeletal   Abdominal   Peds  Hematology negative hematology ROS (+)   Anesthesia Other Findings Past Medical History:   Bilateral breast cysts                                       Hemorrhoids                                                  Irritable bowel disease                                      Arthritis                                                    COPD (chronic obstructive pulmonary disease) (*              GERD (gastroesophageal reflux disease)                       Complication of anesthesia                                     Comment:pt woke up during breast surgery (1995)   Cancer ([place])                                    04-25-15        Comment:BENIGN PHYLLODES TUMOR, 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, WITH EXTENSIVE               DUCTAL CARCINOMA IN    Reproductive/Obstetrics negative OB ROS                             Anesthesia Physical Anesthesia  Plan  ASA: II  Anesthesia Plan: General   Post-op Pain Management:    Induction:   Airway Management Planned:   Additional Equipment:   Intra-op Plan:  Post-operative Plan:   Informed Consent: I have reviewed the patients History and Physical, chart, labs and discussed the procedure including the risks, benefits and alternatives for the proposed anesthesia with the patient or authorized representative who has indicated his/her understanding and acceptance.   Dental Advisory Given  Plan Discussed with: Anesthesiologist, CRNA and Surgeon  Anesthesia Plan Comments:         Anesthesia Quick Evaluation

## 2015-07-07 NOTE — Discharge Summary (Signed)
[facility] [place] NAME: Meagan York    MR#:  [id number]  DATE OF BIRTH:  1955-09-14  DATE OF ADMISSION:  07/04/2015 ADMITTING PHYSICIAN: Harrie Foreman, MD  DATE OF DISCHARGE: 07/07/2015  PRIMARY CARE PHYSICIAN: No PCP Per Patient    ADMISSION DIAGNOSIS:  epigastric pain and pain in lungs anemia, melena melena, anemia  DISCHARGE DIAGNOSIS:  Principal Problem:   Upper GI bleed   SECONDARY DIAGNOSIS:   Past Medical History  Diagnosis Date  . Bilateral breast cysts   . Hemorrhoids   . Irritable bowel disease   . Arthritis   . COPD (chronic obstructive pulmonary disease) ([place])   . GERD (gastroesophageal reflux disease)   . Complication of anesthesia     pt woke up during breast surgery (1995)  . 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 GI bleed, upper: She does attests to some NSAID usage however none greater than 2 Aleve a day,  Protonix IV, avoid further NSAID/antiplatelet/anticoagulant, consult gastroenterology - he did endoscopy and colonoscopy. Hb stable.  2. COPD unspecified: Not in acute exacerbation however some evidence of potential infection on imaging combined with symptoms of cough, shortness of breath will dose azithromycin. 3. Hypokalemia: Replace goal 4-5 4. Venous thromboembolism prophylactic: SCDs 5. Chronic joint pains- she is requesting me for non- NSAIDs prescriptions to avoid GI issues in future.   given oxycodon on d/c. Follow with ortho clinic.  DISCHARGE CONDITIONS:   Stable.  CONSULTS OBTAINED:  Treatment Team:  Lytle Butte, MD Josefine Class, MD  DRUG ALLERGIES:   Allergies  Allergen Reactions  . Tramadol Nausea And Vomiting    DISCHARGE MEDICATIONS:   Current Discharge Medication List     START taking these medications   Details  oxyCODONE (OXY IR/ROXICODONE) 5 MG immediate release tablet Take 1 tablet (5 mg total) by mouth 3 (three) times daily as needed for moderate pain. Qty: 30 tablet, Refills: 0    pantoprazole (PROTONIX) 40 MG tablet Take 1 tablet (40 mg total) by mouth 2 (two) times daily. Qty: 60 tablet, Refills: 1      CONTINUE these medications which have NOT CHANGED   Details  cholecalciferol (VITAMIN D) 1000 UNITS tablet Take 1,000 Units by mouth daily.    letrozole (FEMARA) 2.5 MG tablet Take 1 tablet (2.5 mg total) by mouth daily. Qty: 30 tablet, Refills: 12    albuterol (PROVENTIL HFA;VENTOLIN HFA) 108 (90 BASE) MCG/ACT inhaler Inhale into the lungs every 4 (four) hours as needed.     Iron-Vitamins (GERITOL COMPLETE PO) Take 1 tablet by mouth daily.      STOP taking these medications     ranitidine (ZANTAC) 150 MG tablet          DISCHARGE INSTRUCTIONS:    Follow with GI and Ortho clinic. No OTC pain meds.  If you experience worsening of your admission symptoms, develop shortness of breath, life threatening emergency, suicidal or homicidal thoughts you must seek medical attention immediately by calling 911 or calling your MD immediately  if symptoms less severe.  You Must read complete instructions/literature along with all the possible adverse reactions/side effects for all the Medicines you take and that have been prescribed to you. Take any new Medicines after you have completely understood and accept  all the possible adverse reactions/side effects.   Please note  You were cared for by a hospitalist during your hospital stay. If you have any questions about your discharge medications or the care you received while you were in the hospital after you are discharged, you can call the unit and asked to speak with the hospitalist on call if the hospitalist that took care of you is not available. Once you are discharged, your primary care  physician will handle any further medical issues. Please note that NO REFILLS for any discharge medications will be authorized once you are discharged, as it is imperative that you return to your primary care physician (or establish a relationship with a primary care physician if you do not have one) for your aftercare needs so that they can reassess your need for medications and monitor your lab values.    Today   CHIEF COMPLAINT:   Chief Complaint  Patient presents with  . Abdominal Pain  . Diarrhea  . Back Pain    HISTORY OF PRESENT ILLNESS:  Meagan York  is a 59 y.o. female with a known history of COPD non-oxygen dependent who is presenting with multiple complaints. Mainly 2-3 day duration of abdominal pain epigastric location burning quality 7/10 intensity no worsening or relieving factors with associated black tarry stools. She also complains of shortness of breath mainly dyspnea on exertion with associated cough, nonproductive subjective chills however denies fever denies any further symptomatology at this time.  VITAL SIGNS:  Blood pressure 130/85, pulse 74, temperature 97.8 F (36.6 C), temperature source Oral, resp. rate 18, height 5\' 5"  (1.651 m), weight 81.602 kg (179 lb 14.4 oz), SpO2 98 %.  I/O:   Intake/Output Summary (Last 24 hours) at 07/07/15 1310 Last data filed at 07/07/15 1200  Gross per 24 hour  Intake 4282.8 ml  Output      0 ml  Net 4282.8 ml    PHYSICAL EXAMINATION:   GENERAL: 59 y.o.-year-old patient lying in the bed with no acute distress.  EYES: Pupils equal, round, reactive to light and accommodation. No scleral icterus. Extraocular muscles intact.  HEENT: Head atraumatic, normocephalic. Oropharynx and nasopharynx clear.  NECK: Supple, no jugular venous distention. No thyroid enlargement, no tenderness.  LUNGS: Normal breath sounds bilaterally, no wheezing, rales,rhonchi or crepitation. No use of accessory muscles of respiration.   CARDIOVASCULAR: S1, S2 normal. No murmurs, rubs, or gallops.  ABDOMEN: Soft, mild epigastric tender, nondistended. Bowel sounds present. No organomegaly or mass.  EXTREMITIES: No pedal edema, cyanosis, or clubbing.  NEUROLOGIC: Cranial nerves II through XII are intact. Muscle strength 5/5 in all extremities. Sensation intact. Gait not checked.  PSYCHIATRIC: The patient is alert and oriented x 3.  SKIN: No obvious rash, lesion, or ulcer.   DATA REVIEW:   CBC  Recent Labs Lab 07/05/15 2217  WBC 7.9  HGB 9.1*  HCT 27.6*  PLT 235    Chemistries   Recent Labs Lab 07/04/15 2046  NA 139  K 3.4*  CL 108  CO2 27  GLUCOSE 121*  BUN 21*  CREATININE 0.61  CALCIUM 9.1  AST 19  ALT 17  ALKPHOS 75  BILITOT 0.4    Cardiac Enzymes  Recent Labs Lab 07/04/15 2046  TROPONINI <0.03    Microbiology Results  No results found for this or any previous visit.  RADIOLOGY:  No results found.  EKG:   Orders placed or performed during the hospital encounter of 07/04/15  . EKG 12-Lead  .  EKG 12-Lead  . EKG      Management plans discussed with the patient, family and they are in agreement.  CODE STATUS:     Code Status Orders        Start     Ordered   07/05/15 0328  Full code   Continuous     07/05/15 0327      TOTAL TIME TAKING CARE OF THIS PATIENT: 35 minutes.    Vaughan Basta M.D on 07/07/2015 at 1:10 PM  Between 7am to 6pm - Pager - [phone]  After 6pm go to [email or web address] - password [email or web address] [facility]  [facility]  Office  [phone]  CC: Primary care physician; No PCP Per Patient   Note: This dictation was prepared with Dragon dictation along with smaller phrase technology. Any transcriptional errors that result from this process are unintentional.

## 2015-07-07 NOTE — Op Note (Signed)
[facility] Gastroenterology Patient Name: Meagan York Procedure Date: 07/07/2015 3:17 PM MRN: [id number] Account #: [id number] Date of Birth: May 25, 1956 Admit Type: Inpatient Age: 59 Room: [facility] ENDO ROOM 2 Gender: Female Note Status: Finalized Procedure:         Colonoscopy Indications:       Screening in patient at increased risk: Colorectal cancer                     in father 88 or older, This is the patient's first                     colonoscopy Patient Profile:   This is a 59 year old female. Providers:         Gerrit Heck. Rayann Heman, MD Medicines:         Propofol per Anesthesia Complications:     No immediate complications. Procedure:         Pre-Anesthesia Assessment:                    - Prior to the procedure, a History and Physical was                     performed, and patient medications, allergies and                     sensitivities were reviewed. The patient's tolerance of                     previous anesthesia was reviewed.                    After obtaining informed consent, the colonoscope was                     passed under direct vision. Throughout the procedure, the                     patient's blood pressure, pulse, and oxygen saturations                     were monitored continuously. The Olympus CF-Q160AL                     colonoscope (S#. [id number]) was introduced through the anus                     and advanced to the the cecum, identified by appendiceal                     orifice and ileocecal valve. The colonoscopy was somewhat                     difficult due to significant looping. Successful                     completion of the procedure was aided by using manual                     pressure. The patient tolerated the procedure well. The                     quality of the bowel preparation was good. Findings:      The perianal exam findings include non-thrombosed external hemorrhoids.      Internal hemorrhoids were found  during retroflexion. The hemorrhoids       were Grade I (internal hemorrhoids that do not prolapse).      The exam was otherwise without abnormality. Impression:        - Non-thrombosed external hemorrhoids found on perianal                     exam.                    - Internal hemorrhoids.                    - The examination was otherwise normal.                    - No specimens collected. Recommendation:    - Observe patient in GI recovery unit.                    - High fiber diet.                    - Continue present medications.                    - Repeat colonoscopy in 5 years for screening purposes.                    - The findings and recommendations were discussed with the                     patient. Procedure Code(s): --- Professional ---                    [phone], Colonoscopy, flexible; diagnostic, including                     collection of specimen(s) by brushing or washing, when                     performed (separate procedure) CPT copyright 2014 American Medical Association. All rights reserved. The codes documented in this report are preliminary and upon coder review may  be revised to meet current compliance requirements. Mellody Life, MD 07/07/2015 3:42:59 PM This report has been signed electronically. Number of Addenda: 0 Note Initiated On: 07/07/2015 3:17 PM Scope Withdrawal Time: 0 hours 11 minutes 13 seconds  Total Procedure Duration: 0 hours 19 minutes 41 seconds       [facility]

## 2015-07-09 ENCOUNTER — Ambulatory Visit: Payer: Managed Care, Other (non HMO) | Attending: Radiation Oncology | Admitting: Radiation Oncology

## 2015-07-09 NOTE — Anesthesia Postprocedure Evaluation (Signed)
  Anesthesia Post-op Note  Patient: Meagan York  Procedure(s) Performed: Procedure(s): ESOPHAGOGASTRODUODENOSCOPY (EGD) WITH PROPOFOL (N/A) COLONOSCOPY WITH PROPOFOL (N/A)  Anesthesia type:General  Patient location: PACU  Post pain: Pain level controlled  Post assessment: Post-op Vital signs reviewed, Patient's Cardiovascular Status Stable, Respiratory Function Stable, Patent Airway and No signs of Nausea or vomiting  Post vital signs: Reviewed and stable  Last Vitals:  Filed Vitals:   07/07/15 1712  BP: 140/93  Pulse: 82  Temp: 36.8 C  Resp: 18    Level of consciousness: awake, alert  and patient cooperative  Complications: No apparent anesthesia complications

## 2015-07-10 ENCOUNTER — Encounter: Payer: Self-pay | Admitting: Gastroenterology

## 2015-07-15 ENCOUNTER — Encounter: Payer: Self-pay | Admitting: General Surgery

## 2015-07-15 ENCOUNTER — Ambulatory Visit (INDEPENDENT_AMBULATORY_CARE_PROVIDER_SITE_OTHER): Payer: Managed Care, Other (non HMO) | Admitting: General Surgery

## 2015-07-15 VITALS — BP 118/82 | HR 72 | Resp 14 | Ht 65.0 in | Wt 181.0 lb

## 2015-07-15 DIAGNOSIS — D4861 Neoplasm of uncertain behavior of right breast: Secondary | ICD-10-CM

## 2015-07-15 DIAGNOSIS — D0511 Intraductal carcinoma in situ of right breast: Secondary | ICD-10-CM

## 2015-07-15 MED ORDER — OXYCODONE HCL 5 MG PO TABS: 5.0000 mg | ORAL_TABLET | Freq: Three times a day (TID) | ORAL | Status: DC | PRN

## 2015-07-15 NOTE — Progress Notes (Signed)
Patient ID: Meagan York, female   DOB: [birth year], 57 y.o.   MRN: [id number]  Chief Complaint  Patient presents with  . Follow-up    breast cancer    HPI Meagan York is a 59 y.o. female here today to here follow up breast cancer check. Patient states she is doing well. Right lumpectomy on 04-25-15.  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 Tolerating femara but having hot flashes. She was recently hospitalized for abdominal pain and black tarry stools which was found to likely be related to taking too much Aleve and aspirin for left foot pain. Switched to oxycodone for pain, still having pain and swelling of left foot that is poorly controlled. She had an upper and lower endoscopy by Dr Rayann Heman- bleeding ulcer. Currently on PPI I have reviewed the history of present illness with the patient. HPI  Past Medical History  Diagnosis Date  . Bilateral breast cysts   . Hemorrhoids   . Irritable bowel disease   . Arthritis   . COPD (chronic obstructive pulmonary disease) ([place])   . GERD (gastroesophageal reflux disease)   . Complication of anesthesia     pt woke up during breast surgery (1995)  . 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 Appendectomy    . Knee surgery    . Cyst lower spine    . Piondial cyst excision  03/1989  . Breast surgery Left     cyst removal  . Breast biopsy Right 01-01-15    BIPHASIC FIBROEPITHELIAL LESION with ADH  . Breast lumpectomy Right 04/25/2015    Procedure: RIGHT BREAST LUMPECTOMY, exicision of skin tag right face;  Surgeon: Christene Lye, MD;  Location: [facility] ORS;  Service: General;  Laterality: Right;  . Breast mammosite Right 05-21-15  . Esophagogastroduodenoscopy (egd) with propofol N/A 07/07/2015    Procedure: ESOPHAGOGASTRODUODENOSCOPY (EGD) WITH PROPOFOL;  Surgeon: Josefine Class, MD;  Location: [facility] ENDOSCOPY;  Service: Endoscopy;  Laterality: N/A;  . Colonoscopy with propofol N/A 07/07/2015    Procedure: COLONOSCOPY WITH PROPOFOL;  Surgeon: Josefine Class, MD;  Location: [facility] ENDOSCOPY;  Service: Endoscopy;  Laterality: N/A;    Family History  Problem Relation Age of Onset  . Colon cancer Father     age 27    Social History Social History  Substance Use Topics  . Smoking status: Current Every Day Smoker -- 1.00 packs/day for 40 years    Types: Cigarettes  . Smokeless tobacco: Never Used  . Alcohol Use: No    Allergies  Allergen Reactions  . Tramadol Nausea And Vomiting    Current Outpatient Prescriptions  Medication Sig Dispense Refill  . albuterol (PROVENTIL HFA;VENTOLIN HFA) 108 (90 BASE) MCG/ACT inhaler Inhale into the lungs every 4 (four) hours as needed.     . cholecalciferol (VITAMIN D) 1000 UNITS tablet Take 1,000 Units by mouth daily.    . Iron-Vitamins (GERITOL COMPLETE PO) Take 1 tablet by mouth daily.    Marland Kitchen letrozole (FEMARA) 2.5 MG tablet Take 1 tablet (2.5 mg total) by mouth daily. 30 tablet 12  . oxyCODONE (OXY IR/ROXICODONE) 5 MG immediate release tablet     . pantoprazole (PROTONIX) 40 MG tablet Take 1 tablet (40 mg total) by mouth 2 (two) times daily. 60 tablet 1  . oxyCODONE (ROXICODONE) 5 MG immediate  release tablet Take 1 tablet (5 mg total) by mouth every 8 (eight) hours as needed. 30 tablet 0   No current facility-administered medications for this visit.    Review of Systems Review of Systems  Constitutional: Negative.   Respiratory: Negative.   Cardiovascular: Negative.     Blood pressure 118/82, pulse 72, resp. rate 14, height 5\' 5"  (1.651 m), weight 181 lb (82.101 kg).  Physical Exam Physical Exam  Constitutional: She is oriented to person, place, and time. She appears well-developed and well-nourished.  Eyes: Conjunctivae are normal. No scleral icterus.  Neck: Neck supple.  Cardiovascular: Normal rate,  regular rhythm and normal heart sounds.   Pulmonary/Chest: Effort normal and breath sounds normal. Right breast exhibits no inverted nipple, no mass, no nipple discharge, no skin change and no tenderness. Left breast exhibits no inverted nipple, no mass, no nipple discharge, no skin change and no tenderness.  Well healed lumpectomy site on right breast, no palpable mass  Abdominal: Soft. Bowel sounds are normal. There is no tenderness.  Lymphadenopathy:    She has no cervical adenopathy.    She has no axillary adenopathy.  Neurological: She is alert and oriented to person, place, and time.  Skin: Skin is warm and dry.  Psychiatric: Her behavior is normal.    Data Reviewed Notes reviewed  Assessment    Stable exam. 66mos post lumpectomy right with radiation, currently on letrozole. Large Phylloides tumor with extensive dcis within.     Plan   Last mammogram revealed 2 other subcentimeter nodules in addition to the large Phylloides tumor.  Right breast diagnostic mammogram to be scheduled in near future. Bil diagnostic mammogram in April. Patient is scheduled for a right diagnostic mammogram at [facility] on 07/25/15 at 11:30 am.    Patient to return in 6 months PCP:  No Pcp   Meagan York 07/15/2015, 12:46 PM

## 2015-07-15 NOTE — Patient Instructions (Addendum)
Continue self breast exams. Call office for any new breast issues or concerns.  Patient is scheduled for a right diagnostic mammogram at [facility] on 07/25/15 at 11:30 am.

## 2015-08-04 ENCOUNTER — Ambulatory Visit: Payer: Managed Care, Other (non HMO) | Admitting: Radiation Oncology

## 2015-08-18 ENCOUNTER — Encounter: Payer: Self-pay | Admitting: General Surgery

## 2015-08-18 ENCOUNTER — Encounter: Payer: Self-pay | Admitting: *Deleted

## 2015-08-25 ENCOUNTER — Telehealth: Payer: Self-pay | Admitting: General Surgery

## 2015-08-25 NOTE — Telephone Encounter (Signed)
PT CALLED & STATED SHE SAW YOU WHILE SHE WAS AN INPT.SHE ASK IF YOU WOULD REFILL HER OXYCODONE HCL 5MG . JUST ENOUGH UNTIL HER  DOCTORS APPT WITH DR Vickki Muff 08-31-18  PT HAD AN EARLIER APPT WITH ANOTHER DOCTOR & THEY HAD TO CX HER APPT, HE HAD SX. SHE STATES SHE IS UNABLE TO TAKE ASPRIN. HER PHARMACY IS [facility].PTS CELL # [phone].

## 2015-08-26 NOTE — Telephone Encounter (Signed)
Per Dr. Jamal Collin declined prescription, best to see Dr. Vickki Muff.

## 2015-10-20 ENCOUNTER — Encounter: Payer: Self-pay | Admitting: *Deleted

## 2015-10-20 ENCOUNTER — Ambulatory Visit
Admission: EM | Admit: 2015-10-20 | Discharge: 2015-10-20 | Disposition: A | Discharge status: Home / Self Care | Payer: Managed Care, Other (non HMO) | Attending: Family Medicine | Admitting: Family Medicine

## 2015-10-20 DIAGNOSIS — J441 Chronic obstructive pulmonary disease with (acute) exacerbation: Secondary | ICD-10-CM

## 2015-10-20 LAB — RAPID STREP SCREEN (MED CTR MEBANE ONLY): Streptococcus, Group A Screen (Direct): NEGATIVE

## 2015-10-20 MED ORDER — PREDNISONE 20 MG PO TABS: ORAL_TABLET | ORAL | Status: DC

## 2015-10-20 MED ORDER — DOXYCYCLINE HYCLATE 100 MG PO TABS: 100.0000 mg | ORAL_TABLET | Freq: Two times a day (BID) | ORAL | Status: DC

## 2015-10-20 MED ORDER — GUAIFENESIN-CODEINE 100-10 MG/5ML PO SOLN: ORAL | Status: DC

## 2015-10-20 NOTE — ED Provider Notes (Signed)
CSN: [id number]     Arrival date & time 10/20/15  1417 History   First MD Initiated Contact with Patient 10/20/15 1540     Chief Complaint  Patient presents with  . Sore Throat  . Nasal Congestion   (Consider location/radiation/quality/duration/timing/severity/associated sxs/prior Treatment) Patient is a 60 y.o. female presenting with URI. The history is provided by the patient.  URI Presenting symptoms: congestion, cough, fatigue and sore throat   Severity:  Moderate Onset quality:  Sudden Duration:  6 days Timing:  Constant Progression:  Worsening Chronicity:  New Relieved by:  None tried Associated symptoms: wheezing   Associated symptoms: no arthralgias, no headaches, no myalgias, no sinus pain, no sneezing and no swollen glands   Risk factors: chronic respiratory disease (copd)   Risk factors: not elderly, no chronic cardiac disease, no chronic kidney disease, no immunosuppression, no recent illness and no recent travel     Past Medical History  Diagnosis Date  . Bilateral breast cysts   . Hemorrhoids   . Irritable bowel disease   . Arthritis   . COPD (chronic obstructive pulmonary disease) ([place])   . GERD (gastroesophageal reflux disease)   . Complication of anesthesia     pt woke up during breast surgery (1995)  . 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 Appendectomy    . Knee surgery    . Cyst lower spine    . Piondial cyst excision  03/1989  . Breast surgery Left     cyst removal  . Breast biopsy Right 01-01-15    BIPHASIC FIBROEPITHELIAL LESION with ADH  . Breast lumpectomy Right 04/25/2015    Procedure: RIGHT BREAST LUMPECTOMY, exicision of skin tag right face;  Surgeon: Christene Lye, MD;  Location: [facility] ORS;  Service: General;  Laterality: Right;  . Breast mammosite Right 05-21-15  . Esophagogastroduodenoscopy (egd) with propofol N/A 07/07/2015     Procedure: ESOPHAGOGASTRODUODENOSCOPY (EGD) WITH PROPOFOL;  Surgeon: Josefine Class, MD;  Location: [facility] ENDOSCOPY;  Service: Endoscopy;  Laterality: N/A;  . Colonoscopy with propofol N/A 07/07/2015    Procedure: COLONOSCOPY WITH PROPOFOL;  Surgeon: Josefine Class, MD;  Location: [facility] ENDOSCOPY;  Service: Endoscopy;  Laterality: N/A;   Family History  Problem Relation Age of Onset  . Colon cancer Father     age 13   Social History  Substance Use Topics  . Smoking status: Current Every Day Smoker -- 1.00 packs/day for 40 years    Types: Cigarettes  . Smokeless tobacco: Never Used  . Alcohol Use: No   OB History    Gravida Para Term Preterm AB TAB SAB Ectopic Multiple Living   2 2 2       2       Obstetric Comments   Menstrual age: 55  Age 1st Pregnancy: 27      Review of Systems  Constitutional: Positive for fatigue.  HENT: Positive for congestion and sore throat. Negative for sneezing.   Respiratory: Positive for cough and wheezing.   Musculoskeletal: Negative for myalgias and arthralgias.  Neurological: Negative for headaches.    Allergies  Tramadol  Home Medications   Prior to Admission medications   Medication Sig Start Date End Date Taking? Authorizing Provider  cholecalciferol (VITAMIN D) 1000 UNITS tablet Take 1,000 Units by mouth daily.   Yes Historical Provider, MD  Iron-Vitamins (GERITOL COMPLETE PO) Take 1  tablet by mouth daily.   Yes Historical Provider, MD  pantoprazole (PROTONIX) 40 MG tablet Take 1 tablet (40 mg total) by mouth 2 (two) times daily. 07/07/15  Yes Vaughan Basta, MD  albuterol (PROVENTIL HFA;VENTOLIN HFA) 108 (90 BASE) MCG/ACT inhaler Inhale 2 puffs into the lungs every 4 (four) hours as needed for wheezing.  10/17/14 10/17/15  Historical Provider, MD  doxycycline (VIBRA-TABS) 100 MG tablet Take 1 tablet (100 mg total) by mouth 2 (two) times daily. 10/20/15   Norval Gable, MD  guaiFENesin-codeine 100-10 MG/5ML syrup 10 ml po qhs  prn 10/20/15   Norval Gable, MD  letrozole [facility]) 2.5 MG tablet Take 1 tablet (2.5 mg total) by mouth daily. 06/04/15   Seeplaputhur Robinette Haines, MD  oxyCODONE (OXY IR/ROXICODONE) 5 MG immediate release tablet  07/07/15   Historical Provider, MD  oxyCODONE (ROXICODONE) 5 MG immediate release tablet Take 1 tablet (5 mg total) by mouth every 8 (eight) hours as needed. 07/15/15 07/14/16  Seeplaputhur Robinette Haines, MD  predniSONE (DELTASONE) 20 MG tablet 3 tabs po qd for 2 days, then 2 tabs po qd for 3 days, then 1 tab po qd for 3 days, then half a tab po qd for 2 days 10/20/15   Norval Gable, MD   Meds Ordered and Administered this Visit  Medications - No data to display  BP 148/96 mmHg No data found.   Physical Exam  Constitutional: She appears well-developed and well-nourished. No distress.  HENT:  Head: Normocephalic and atraumatic.  Right Ear: Tympanic membrane, external ear and ear canal normal.  Left Ear: Tympanic membrane, external ear and ear canal normal.  Nose: Mucosal edema and rhinorrhea present. No nose lacerations, sinus tenderness, nasal deformity, septal deviation or nasal septal hematoma. No epistaxis.  No foreign bodies.  Mouth/Throat: Uvula is midline, oropharynx is clear and moist and mucous membranes are normal. No oropharyngeal exudate.  Eyes: Conjunctivae and EOM are normal. Pupils are equal, round, and reactive to light. Right eye exhibits no discharge. Left eye exhibits no discharge. No scleral icterus.  Neck: Normal range of motion. Neck supple. No thyromegaly present.  Cardiovascular: Normal rate, regular rhythm and normal heart sounds.   Pulmonary/Chest: Effort normal. No respiratory distress. She has wheezes. She has no rales.  Diffuse rhonchi and wheezes bilaterally  Lymphadenopathy:    She has no cervical adenopathy.  Skin: She is not diaphoretic.  Nursing note and vitals reviewed.   ED Course  Procedures (including critical care time)  Labs Review Labs Reviewed   RAPID STREP SCREEN (NOT AT [facility])  CULTURE, GROUP A STREP [facility])    Imaging Review No results found.   Visual Acuity Review  Right Eye Distance:   Left Eye Distance:   Bilateral Distance:    Right Eye Near:   Left Eye Near:    Bilateral Near:         MDM   1. COPD exacerbation [facility])    Discharge Medication List as of 10/20/2015  3:59 PM    START taking these medications   Details  doxycycline (VIBRA-TABS) 100 MG tablet Take 1 tablet (100 mg total) by mouth 2 (two) times daily., Starting 10/20/2015, Until Discontinued, Normal    guaiFENesin-codeine 100-10 MG/5ML syrup 10 ml po qhs prn, Print    predniSONE (DELTASONE) 20 MG tablet 3 tabs po qd for 2 days, then 2 tabs po qd for 3 days, then 1 tab po qd for 3 days, then half a tab po qd for 2  days, Normal       1. Lab result and diagnosis reviewed with patient 2. rx as per orders above; reviewed possible side effects, interactions, risks and benefits  3. Recommend supportive treatment with rest, increased fluids, otc analgesics prn 4. Follow-up prn if symptoms worsen or don't improve     Norval Gable, MD 10/20/15 1609

## 2015-10-20 NOTE — ED Notes (Signed)
Patient started with a severe sore throat last Wednesday (10-15-15) and additional symptoms of nasal congestion, cough, chest congestion, and headache occuring later. Patient has a history of COPD.

## 2015-10-22 LAB — CULTURE, GROUP A STREP (THRC)

## 2015-10-24 ENCOUNTER — Telehealth: Payer: Self-pay

## 2015-10-24 ENCOUNTER — Ambulatory Visit (INDEPENDENT_AMBULATORY_CARE_PROVIDER_SITE_OTHER): Payer: Managed Care, Other (non HMO) | Admitting: Family Medicine

## 2015-10-24 ENCOUNTER — Encounter: Payer: Self-pay | Admitting: Family Medicine

## 2015-10-24 VITALS — BP 168/90 | HR 84 | Temp 98.2°F | Ht 62.6 in | Wt 189.0 lb

## 2015-10-24 DIAGNOSIS — R030 Elevated blood-pressure reading, without diagnosis of hypertension: Secondary | ICD-10-CM | POA: Insufficient documentation

## 2015-10-24 DIAGNOSIS — J449 Chronic obstructive pulmonary disease, unspecified: Secondary | ICD-10-CM | POA: Insufficient documentation

## 2015-10-24 DIAGNOSIS — IMO0001 Reserved for inherently not codable concepts without codable children: Secondary | ICD-10-CM

## 2015-10-24 DIAGNOSIS — M79671 Pain in right foot: Secondary | ICD-10-CM | POA: Insufficient documentation

## 2015-10-24 DIAGNOSIS — M1990 Unspecified osteoarthritis, unspecified site: Secondary | ICD-10-CM | POA: Insufficient documentation

## 2015-10-24 DIAGNOSIS — M79672 Pain in left foot: Secondary | ICD-10-CM | POA: Diagnosis not present

## 2015-10-24 DIAGNOSIS — K219 Gastro-esophageal reflux disease without esophagitis: Secondary | ICD-10-CM

## 2015-10-24 MED ORDER — PANTOPRAZOLE SODIUM 40 MG PO TBEC: 40.0000 mg | DELAYED_RELEASE_TABLET | Freq: Two times a day (BID) | ORAL | Status: DC

## 2015-10-24 NOTE — Assessment & Plan Note (Signed)
Quite elevated. Slightly better on recheck. Possibly due to NSAID use and pain. Will have her work on the [employer] and recheck in 3-4 weeks.

## 2015-10-24 NOTE — Progress Notes (Signed)
BP 168/90 mmHg  Pulse 84  Temp(Src) 98.2 F (36.8 C)  Ht 5' 2.6" (1.59 m)  Wt 189 lb (85.73 kg)  BMI 33.91 kg/m2  SpO2 98%   Subjective:    Patient ID: Meagan York, female    DOB: [birth year], 60 y.o.   MRN: [id number]  HPI: Meagan York is a 60 y.o. female who presents today to establish care.   Chief Complaint  Patient presents with  . Gastroesophageal Reflux  . Heel Spurs   ELEVATED BLOOD PRESSURE Duration of elevated BP: unknown BP monitoring frequency: not checking Previous BP meds: no Recent stressors: yes Family history of hypertension: yes Recurrent headaches: yes Visual changes: yes Palpitations: no  Dyspnea: yes Chest pain: yes Lower extremity edema: yes Dizzy/lightheaded: yes Transient ischemic attacks: no  GERD GERD control status: controlled  Satisfied with current treatment? yes Heartburn frequency: rarely Medication side effects: no  Medication compliance: excellent compliance Previous GERD medications: none Antacid use frequency:  none Dysphagia: no Odynophagia:  no Hematemesis: no Blood in stool: no EGD: yes  FOOT PAIN- states that she has had heel spurs for about a year now. Saw Dr. Vickki Muff who said that she would need surgery or a boot. She just got a really good job and can't take the time off for that. She is not happy with her options and would like pain medicine as she states she is in constant severe pain. Duration: about a year Involved foot: bilateral Mechanism of injury: no trauma Location: heels Onset: gradual  Severity: severe  Quality:  Ripping pain Frequency: constant Radiation: yes into her achilles Aggravating factors: weight bearing, walking and stairs  Alleviating factors: pain medicine  Status: stable Treatments attempted: rest, ice, heat, APAP, ibuprofen and aleve  Relief with NSAIDs?:  moderate Weakness with weight bearing or walking: no Morning stiffness: yes Swelling: yes Redness: no Bruising:  no Paresthesias / decreased sensation: no  Fevers:no  Relevant past medical, surgical, family and social history reviewed and updated as indicated. Interim medical history since our last visit reviewed. Allergies and medications reviewed and updated.  Review of Systems  Constitutional: Negative.   Respiratory: Negative.   Cardiovascular: Negative.   Musculoskeletal: Positive for joint swelling, arthralgias and gait problem. Negative for myalgias, back pain, neck pain and neck stiffness.  Skin: Negative.   Psychiatric/Behavioral: Negative.     Per HPI unless specifically indicated above     Objective:    BP 168/90 mmHg  Pulse 84  Temp(Src) 98.2 F (36.8 C)  Ht 5' 2.6" (1.59 m)  Wt 189 lb (85.73 kg)  BMI 33.91 kg/m2  SpO2 98%  Wt Readings from Last 3 Encounters:  10/24/15 189 lb (85.73 kg)  07/15/15 181 lb (82.101 kg)  07/07/15 179 lb (81.194 kg)    Physical Exam  Constitutional: She is oriented to person, place, and time. She appears well-developed and well-nourished. No distress.  HENT:  Head: Normocephalic and atraumatic.  Right Ear: Hearing normal.  Left Ear: Hearing normal.  Nose: Nose normal.  Eyes: Conjunctivae and lids are normal. Right eye exhibits no discharge. Left eye exhibits no discharge. No scleral icterus.  Cardiovascular: Normal rate, regular rhythm, normal heart sounds and intact distal pulses.  Exam reveals no gallop and no friction rub.   No murmur heard. Pulmonary/Chest: Effort normal and breath sounds normal. No respiratory distress. She has no wheezes. She has no rales. She exhibits no tenderness.  Musculoskeletal: Normal range of motion.  Slight prominence and discomfort  over the posterior side of the calcaneous bilaterally, mild swelling, negative squeeze test. No tenderness to palpation of her plantar fascia.  Neurological: She is alert and oriented to person, place, and time.  Skin: Skin is warm, dry and intact. No rash noted. She is not  diaphoretic. No erythema. No pallor.  Psychiatric: She has a normal mood and affect. Her speech is normal and behavior is normal. Judgment and thought content normal. Cognition and memory are normal.  Nursing note and vitals reviewed.       Assessment & Plan:   Problem List Items Addressed This Visit      Digestive   GERD (gastroesophageal reflux disease)    Under good control on current regimen. Continue current regimen. Continue to monitor.         Other   Heel pain, bilateral - Primary    Patient is in significant pain. Area of pain is not typical of heel spur. ?achilles tendonitis Will obtain records from her podiatrist. States that she has already used voltaren. Cannot tolerated NSAIDs, cannot tolerate tramadol. Is not able to take time off for surgery or wear a boot. Will send to podiatry for 2nd opinion. I informed patient that I do not prescribe any controlled substances on the first visit and that I would need to review her records from her previous provider prior to making a decision about whether we would give her any pain medications or not.       Relevant Orders   Ambulatory referral to Podiatry   Elevated blood pressure    Quite elevated. Slightly better on recheck. Possibly due to NSAID use and pain. Will have her work on the [employer] and recheck in 3-4 weeks.           Follow up plan: Return 3-4 weeks, for Follow up BP and heel pain.

## 2015-10-24 NOTE — Assessment & Plan Note (Signed)
Patient is in significant pain. Area of pain is not typical of heel spur. ?achilles tendonitis Will obtain records from her podiatrist. States that she has already used voltaren. Cannot tolerated NSAIDs, cannot tolerate tramadol. Is not able to take time off for surgery or wear a boot. Will send to podiatry for 2nd opinion. I informed patient that I do not prescribe any controlled substances on the first visit and that I would need to review her records from her previous provider prior to making a decision about whether we would give her any pain medications or not.

## 2015-10-24 NOTE — Patient Instructions (Signed)
DASH Eating Plan  DASH stands for "Dietary Approaches to Stop Hypertension." The DASH eating plan is a healthy eating plan that has been shown to reduce high blood pressure (hypertension). Additional health benefits may include reducing the risk of type 2 diabetes mellitus, heart disease, and stroke. The DASH eating plan may also help with weight loss.  WHAT DO I NEED TO KNOW ABOUT THE DASH EATING PLAN?  For the DASH eating plan, you will follow these general guidelines:  · Choose foods with a percent daily value for sodium of less than 5% (as listed on the food label).  · Use salt-free seasonings or herbs instead of table salt or sea salt.  · Check with your health care provider or pharmacist before using salt substitutes.  · Eat lower-sodium products, often labeled as "lower sodium" or "no salt added."  · Eat fresh foods.  · Eat more vegetables, fruits, and low-fat dairy products.  · Choose whole grains. Look for the word "whole" as the first word in the ingredient list.  · Choose fish and skinless chicken or turkey more often than red meat. Limit fish, poultry, and meat to 6 oz (170 g) each day.  · Limit sweets, desserts, sugars, and sugary drinks.  · Choose heart-healthy fats.  · Limit cheese to 1 oz (28 g) per day.  · Eat more home-cooked food and less restaurant, buffet, and fast food.  · Limit fried foods.  · Cook foods using methods other than frying.  · Limit canned vegetables. If you do use them, rinse them well to decrease the sodium.  · When eating at a restaurant, ask that your food be prepared with less salt, or no salt if possible.  WHAT FOODS CAN I EAT?  Seek help from a dietitian for individual calorie needs.  Grains  Whole grain or whole wheat bread. Brown rice. Whole grain or whole wheat pasta. Quinoa, bulgur, and whole grain cereals. Low-sodium cereals. Corn or whole wheat flour tortillas. Whole grain cornbread. Whole grain crackers. Low-sodium crackers.  Vegetables  Fresh or frozen vegetables  (raw, steamed, roasted, or grilled). Low-sodium or reduced-sodium tomato and vegetable juices. Low-sodium or reduced-sodium tomato sauce and paste. Low-sodium or reduced-sodium canned vegetables.   Fruits  All fresh, canned (in natural juice), or frozen fruits.  Meat and Other Protein Products  Ground beef (85% or leaner), grass-fed beef, or beef trimmed of fat. Skinless chicken or turkey. Ground chicken or turkey. Pork trimmed of fat. All fish and seafood. Eggs. Dried beans, peas, or lentils. Unsalted nuts and seeds. Unsalted canned beans.  Dairy  Low-fat dairy products, such as skim or 1% milk, 2% or reduced-fat cheeses, low-fat ricotta or cottage cheese, or plain low-fat yogurt. Low-sodium or reduced-sodium cheeses.  Fats and Oils  Tub margarines without trans fats. Light or reduced-fat mayonnaise and salad dressings (reduced sodium). Avocado. Safflower, olive, or canola oils. Natural peanut or almond butter.  Other  Unsalted popcorn and pretzels.  The items listed above may not be a complete list of recommended foods or beverages. Contact your dietitian for more options.  WHAT FOODS ARE NOT RECOMMENDED?  Grains  White bread. White pasta. White rice. Refined cornbread. Bagels and croissants. Crackers that contain trans fat.  Vegetables  Creamed or fried vegetables. Vegetables in a cheese sauce. Regular canned vegetables. Regular canned tomato sauce and paste. Regular tomato and vegetable juices.  Fruits  Dried fruits. Canned fruit in light or heavy syrup. Fruit juice.  Meat and Other Protein   Products  Fatty cuts of meat. Ribs, chicken wings, bacon, sausage, bologna, salami, chitterlings, fatback, hot dogs, bratwurst, and packaged luncheon meats. Salted nuts and seeds. Canned beans with salt.  Dairy  Whole or 2% milk, cream, half-and-half, and cream cheese. Whole-fat or sweetened yogurt. Full-fat cheeses or blue cheese. Nondairy creamers and whipped toppings. Processed cheese, cheese spreads, or cheese  curds.  Condiments  Onion and garlic salt, seasoned salt, table salt, and sea salt. Canned and packaged gravies. Worcestershire sauce. Tartar sauce. Barbecue sauce. Teriyaki sauce. Soy sauce, including reduced sodium. Steak sauce. Fish sauce. Oyster sauce. Cocktail sauce. Horseradish. Ketchup and mustard. Meat flavorings and tenderizers. Bouillon cubes. Hot sauce. Tabasco sauce. Marinades. Taco seasonings. Relishes.  Fats and Oils  Butter, stick margarine, lard, shortening, ghee, and bacon fat. Coconut, palm kernel, or palm oils. Regular salad dressings.  Other  Pickles and olives. Salted popcorn and pretzels.  The items listed above may not be a complete list of foods and beverages to avoid. Contact your dietitian for more information.  WHERE CAN I FIND MORE INFORMATION?  National Heart, Lung, [employer] Institute: www.nhlbi.nih.gov/health/health-topics/topics/dash/     This information is not intended to replace advice given to you by your health care provider. Make sure you discuss any questions you have with your health care provider.     Document Released: 08/19/2011 Document Revised: 09/20/2014 Document Reviewed: 07/04/2013  Elsevier Interactive Patient Education ©2016 [employer]

## 2015-10-24 NOTE — Telephone Encounter (Signed)
rx sent to her pharmacy 

## 2015-10-24 NOTE — Assessment & Plan Note (Signed)
Under good control on current regimen. Continue current regimen. Continue to monitor.  

## 2015-10-24 NOTE — Telephone Encounter (Signed)
Please send patients pantoprazole to [facility], she only has 2 pills left.

## 2015-11-03 ENCOUNTER — Encounter: Payer: Self-pay | Admitting: *Deleted

## 2015-11-20 ENCOUNTER — Telehealth: Payer: Self-pay | Admitting: Family Medicine

## 2015-11-20 NOTE — Telephone Encounter (Signed)
Kim from [facility] called wanted to know if pt can take Celebrex for bilateral heel pain. They wanted to double check as pt has history of GI bleeds. Please call back ASAP. Thanks.

## 2015-11-20 NOTE — Telephone Encounter (Signed)
Spoke with Maudie Mercury at [facility], and gave her Dr.Johnson's response.

## 2015-11-20 NOTE — Telephone Encounter (Signed)
Ok for her to take that. Needs to watch for belly pain or black stools. Either happen, she needs to stop it immediately and come in for an appointment.

## 2015-11-24 ENCOUNTER — Ambulatory Visit (INDEPENDENT_AMBULATORY_CARE_PROVIDER_SITE_OTHER): Payer: Managed Care, Other (non HMO) | Admitting: Family Medicine

## 2015-11-24 ENCOUNTER — Encounter: Payer: Self-pay | Admitting: Family Medicine

## 2015-11-24 VITALS — BP 148/90 | HR 84 | Temp 98.1°F | Ht 62.5 in | Wt 190.0 lb

## 2015-11-24 DIAGNOSIS — I10 Essential (primary) hypertension: Secondary | ICD-10-CM | POA: Diagnosis not present

## 2015-11-24 DIAGNOSIS — M79672 Pain in left foot: Secondary | ICD-10-CM

## 2015-11-24 DIAGNOSIS — K219 Gastro-esophageal reflux disease without esophagitis: Secondary | ICD-10-CM | POA: Diagnosis not present

## 2015-11-24 DIAGNOSIS — Z114 Encounter for screening for human immunodeficiency virus [HIV]: Secondary | ICD-10-CM | POA: Diagnosis not present

## 2015-11-24 DIAGNOSIS — I129 Hypertensive chronic kidney disease with stage 1 through stage 4 chronic kidney disease, or unspecified chronic kidney disease: Secondary | ICD-10-CM | POA: Insufficient documentation

## 2015-11-24 DIAGNOSIS — R5382 Chronic fatigue, unspecified: Secondary | ICD-10-CM

## 2015-11-24 DIAGNOSIS — N390 Urinary tract infection, site not specified: Secondary | ICD-10-CM | POA: Diagnosis not present

## 2015-11-24 DIAGNOSIS — M79671 Pain in right foot: Secondary | ICD-10-CM

## 2015-11-24 DIAGNOSIS — Z13220 Encounter for screening for lipoid disorders: Secondary | ICD-10-CM | POA: Diagnosis not present

## 2015-11-24 DIAGNOSIS — Z720 Tobacco use: Secondary | ICD-10-CM | POA: Diagnosis not present

## 2015-11-24 DIAGNOSIS — Z1159 Encounter for screening for other viral diseases: Secondary | ICD-10-CM | POA: Diagnosis not present

## 2015-11-24 DIAGNOSIS — R8281 Pyuria: Secondary | ICD-10-CM

## 2015-11-24 LAB — UA/M W/RFLX CULTURE, ROUTINE
BILIRUBIN UA: NEGATIVE
Glucose, UA: NEGATIVE
KETONES UA: NEGATIVE
LEUKOCYTES UA: NEGATIVE
Nitrite, UA: NEGATIVE
Protein, UA: NEGATIVE
Urobilinogen, Ur: 0.2 mg/dL (ref 0.2–1.0)
pH, UA: 5 (ref 5.0–7.5)

## 2015-11-24 LAB — MICROSCOPIC EXAMINATION
Epithelial Cells (non renal): NONE SEEN /hpf (ref 0–10)
WBC, UA: NONE SEEN /hpf (ref 0–?)

## 2015-11-24 LAB — MICROALBUMIN, URINE WAIVED
Creatinine, Urine Waived: 10 mg/dL (ref 10–300)
MICROALB, UR WAIVED: 10 mg/L (ref 0–19)

## 2015-11-24 MED ORDER — LISINOPRIL 5 MG PO TABS: 5.0000 mg | ORAL_TABLET | Freq: Every day | ORAL | Status: DC

## 2015-11-24 NOTE — Assessment & Plan Note (Signed)
Achilles tendonitis per podiatry. Happy with new podiatrist. Continue to follow with him. Encouraged her to continue the celebrex and her exercises. She will call to set up a follow up appointment with podiatry.

## 2015-11-24 NOTE — Assessment & Plan Note (Addendum)
Not under great control. Will start her on low dose lisinopril. Risks and benefits discussed with patient today. Will recheck BP in 1 month. BP less different in either arm with sitting. Less concern for blockage. Continue to monitor.

## 2015-11-24 NOTE — Assessment & Plan Note (Signed)
Under better control. Continue current regimen. Continue to monitor.

## 2015-11-24 NOTE — Progress Notes (Signed)
BP 148/90 mmHg  Pulse 84  Temp(Src) 98.1 F (36.7 C)  Ht 5' 2.5" (1.588 m)  Wt 190 lb (86.183 kg)  BMI 34.18 kg/m2  SpO2 97%   Subjective:    Patient ID: Meagan York, female    DOB: [birth year], 58 y.o.   MRN: [id number]  HPI: Meagan York is a 60 y.o. female  Chief Complaint  Patient presents with  . Hypertension   ELEVATED BLOOD PRESSURE- has been watching her diet a lot Duration of elevated BP: chronic BP monitoring frequency: not checking Previous BP meds: no Recent stressors: yes Family history of hypertension: yes Recurrent headaches: yes Visual changes: yes Palpitations: yes  Dyspnea: yes Chest pain: yes- in the belly Lower extremity edema: yes Dizzy/lightheaded: yes Transient ischemic attacks: no   Occasionally gets some numbness and tingling in her R hand, over the first couple of fingers, No weakness in that hand, doesn't change color. Does note that her R arm is usually reads BP lower than the R.   Started on celebrex about a week ago. Not doing much better at this time. Given a boot as well. Likes the new podiatrist better than the last one. Still not feeling great, but willing to give the celebrex a chance to start working. To see podiatry again in about a week.   Protonix is making her stomach feel much better. No concerns or complaints at this time.   Relevant past medical, surgical, family and social history reviewed and updated as indicated. Interim medical history since our last visit reviewed. Allergies and medications reviewed and updated.  Review of Systems  Constitutional: Positive for fatigue. Negative for fever, chills, diaphoresis, activity change, appetite change and unexpected weight change.  Respiratory: Positive for shortness of breath. Negative for apnea, cough, choking, chest tightness, wheezing and stridor.   Cardiovascular: Positive for chest pain, palpitations and leg swelling.  Musculoskeletal: Positive for joint swelling,  arthralgias and gait problem. Negative for myalgias, back pain, neck pain and neck stiffness.  Skin: Negative.   Psychiatric/Behavioral: Negative.    Per HPI unless specifically indicated above     Objective:    BP 148/90 mmHg  Pulse 84  Temp(Src) 98.1 F (36.7 C)  Ht 5' 2.5" (1.588 m)  Wt 190 lb (86.183 kg)  BMI 34.18 kg/m2  SpO2 97%  Wt Readings from Last 3 Encounters:  11/24/15 190 lb (86.183 kg)  10/24/15 189 lb (85.73 kg)  07/15/15 181 lb (82.101 kg)    Physical Exam  Constitutional: She is oriented to person, place, and time. She appears well-developed and well-nourished. No distress.  HENT:  Head: Normocephalic and atraumatic.  Right Ear: Hearing normal.  Left Ear: Hearing normal.  Nose: Nose normal.  Eyes: Conjunctivae and lids are normal. Right eye exhibits no discharge. Left eye exhibits no discharge. No scleral icterus.  Cardiovascular: Normal rate, regular rhythm, normal heart sounds and intact distal pulses.  Exam reveals no gallop and no friction rub.   No murmur heard. Equal pulses for upper extremities bilaterally, negative Adson's  Pulmonary/Chest: Effort normal and breath sounds normal. No respiratory distress. She has no wheezes. She has no rales. She exhibits no tenderness.  Musculoskeletal: Normal range of motion.  Neurological: She is alert and oriented to person, place, and time.  Skin: Skin is warm, dry and intact. No rash noted. She is not diaphoretic. No erythema. No pallor.  Psychiatric: She has a normal mood and affect. Her speech is normal and behavior is normal.  Judgment and thought content normal. Cognition and memory are normal.  Nursing note and vitals reviewed.   Results for orders placed or performed in visit on 11/24/15  Microscopic Examination  Result Value Ref Range   WBC, UA None seen 0 -  5 /hpf   RBC, UA 0-2 0 -  2 /hpf   Epithelial Cells (non renal) None seen 0 - 10 /hpf  Microalbumin, Urine Waived  Result Value Ref Range    Microalb, Ur Waived 10 0 - 19 mg/L   Creatinine, Urine Waived 10 10 - 300 mg/dL   Microalb/Creat Ratio <30 <30 mg/g  UA/M w/rflx Culture, Routine  Result Value Ref Range   Specific Gravity, UA <1.005 (L) 1.005 - 1.030   pH, UA 5.0 5.0 - 7.5   Color, UA Yellow Yellow   Appearance Ur Clear Clear   Leukocytes, UA Negative Negative   Protein, UA Negative Negative/Trace   Glucose, UA Negative Negative   Ketones, UA Negative Negative   RBC, UA Trace (A) Negative   Bilirubin, UA Negative Negative   Urobilinogen, Ur 0.2 0.2 - 1.0 mg/dL   Nitrite, UA Negative Negative   Microscopic Examination See below:       Assessment & Plan:   Problem List Items Addressed This Visit      Cardiovascular and Mediastinum   HTN (hypertension) - Primary    Not under great control. Will start her on low dose lisinopril. Risks and benefits discussed with patient today. Will recheck BP in 1 month. BP less different in either arm with sitting. Less concern for blockage. Continue to monitor.       Relevant Medications   lisinopril (PRINIVIL,ZESTRIL) 5 MG tablet   Other Relevant Orders   Comprehensive metabolic panel   Microalbumin, Urine Waived (Completed)     Digestive   GERD (gastroesophageal reflux disease)    Under better control. Continue current regimen. Continue to monitor.         Other   Heel pain, bilateral    Achilles tendonitis per podiatry. Happy with new podiatrist. Continue to follow with him. Encouraged her to continue the celebrex and her exercises. She will call to set up a follow up appointment with podiatry.       Tobacco abuse    Encouraged patient to quit smoking, she is not interested at this time. Labs checked today. Await results.       Relevant Orders   UA/M w/rflx Culture, Routine (Completed)    Other Visit Diagnoses    Chronic fatigue        History of GI bleed. Will check CBC, CMP and TSH. Await results and will treat as needed.     Relevant Orders    CBC with  Differential/Platelet    Comprehensive metabolic panel    TSH    Screening for cholesterol level        Labs checked today. Await results.     Relevant Orders    Lipid Panel w/o Chol/HDL Ratio    Need for hepatitis C screening test        Labs checked today. Await results.     Relevant Orders    Hepatitis C Antibody    Screening for HIV without presence of risk factors        Labs checked today. Await results.     Relevant Orders    HIV antibody        Follow up plan: Return in about 4 weeks (around 12/22/2015)  for BP follow up.

## 2015-11-24 NOTE — Assessment & Plan Note (Signed)
Encouraged patient to quit smoking, she is not interested at this time. Labs checked today. Await results.

## 2015-11-25 ENCOUNTER — Encounter: Payer: Self-pay | Admitting: Family Medicine

## 2015-11-25 LAB — COMPREHENSIVE METABOLIC PANEL
ALK PHOS: 110 IU/L (ref 39–117)
ALT: 17 IU/L (ref 0–32)
AST: 17 IU/L (ref 0–40)
Albumin/Globulin Ratio: 1.6 (ref 1.2–2.2)
Albumin: 3.9 g/dL (ref 3.5–5.5)
BUN/Creatinine Ratio: 14 (ref 9–23)
BUN: 9 mg/dL (ref 6–24)
CHLORIDE: 100 mmol/L (ref 96–106)
CO2: 23 mmol/L (ref 18–29)
Calcium: 9 mg/dL (ref 8.7–10.2)
Creatinine, Ser: 0.63 mg/dL (ref 0.57–1.00)
GFR calc Af Amer: 114 mL/min/{1.73_m2} (ref >59)
GFR calc non Af Amer: 99 mL/min/{1.73_m2} (ref >59)
GLUCOSE: 85 mg/dL (ref 65–99)
Globulin, Total: 2.5 g/dL (ref 1.5–4.5)
Potassium: 4.4 mmol/L (ref 3.5–5.2)
Sodium: 138 mmol/L (ref 134–144)
TOTAL PROTEIN: 6.4 g/dL (ref 6.0–8.5)

## 2015-11-25 LAB — CBC WITH DIFFERENTIAL/PLATELET
BASOS ABS: 0 10*3/uL (ref 0.0–0.2)
Basos: 1 %
EOS (ABSOLUTE): 0.2 10*3/uL (ref 0.0–0.4)
Eos: 3 %
Hematocrit: 34.2 % (ref 34.0–46.6)
Hemoglobin: 10.8 g/dL — ABNORMAL LOW (ref 11.1–15.9)
IMMATURE GRANS (ABS): 0 10*3/uL (ref 0.0–0.1)
Immature Granulocytes: 0 %
LYMPHS: 32 %
Lymphocytes Absolute: 2 10*3/uL (ref 0.7–3.1)
MCH: 27.5 pg (ref 26.6–33.0)
MCHC: 31.6 g/dL (ref 31.5–35.7)
MCV: 87 fL (ref 79–97)
MONOS ABS: 0.6 10*3/uL (ref 0.1–0.9)
Monocytes: 11 %
NEUTROS ABS: 3.3 10*3/uL (ref 1.4–7.0)
Neutrophils: 53 %
PLATELETS: 352 10*3/uL (ref 150–379)
RBC: 3.93 x10E6/uL (ref 3.77–5.28)
RDW: 16.6 % — AB (ref 12.3–15.4)
WBC: 6.1 10*3/uL (ref 3.4–10.8)

## 2015-11-25 LAB — TSH: TSH: 1.66 u[IU]/mL (ref 0.450–4.500)

## 2015-11-25 LAB — LIPID PANEL W/O CHOL/HDL RATIO
CHOLESTEROL TOTAL: 201 mg/dL — AB (ref 100–199)
HDL: 61 mg/dL (ref >39)
LDL CALC: 102 mg/dL — AB (ref 0–99)
TRIGLYCERIDES: 191 mg/dL — AB (ref 0–149)
VLDL CHOLESTEROL CAL: 38 mg/dL (ref 5–40)

## 2015-11-25 LAB — HEPATITIS C ANTIBODY: Hep C Virus Ab: <0.1 s/co ratio (ref 0.0–0.9)

## 2015-11-25 LAB — HIV ANTIBODY (ROUTINE TESTING W REFLEX): HIV SCREEN 4TH GENERATION: NONREACTIVE

## 2015-12-26 ENCOUNTER — Ambulatory Visit: Payer: Managed Care, Other (non HMO) | Admitting: Family Medicine

## 2016-01-20 ENCOUNTER — Other Ambulatory Visit: Payer: Self-pay | Admitting: Family Medicine

## 2016-01-26 IMAGING — CT CT ANGIO CHEST
2 of 5 series · 15 of 30 positions shown · IV contrast (APPLIED)
Comparison: CT of the abdomen and pelvis from 08/07/2012, and chest
radiograph performed 06/19/2014

CLINICAL DATA: Acute onset of epigastric abdominal pain. Black
liquid stool and back pain. Chest pain and dizziness. Left foot
swelling and tiredness. Initial encounter.

EXAM:
CT ANGIOGRAPHY CHEST
CT ABDOMEN AND PELVIS WITH CONTRAST
TECHNIQUE: Multidetector CT imaging of the chest was performed using the
standard protocol during bolus administration of intravenous
contrast. Multiplanar CT image reconstructions and MIPs were
obtained to evaluate the vascular anatomy. Multidetector CT imaging
of the abdomen and pelvis was performed using the standard protocol
during bolus administration of intravenous contrast.
CONTRAST:  100mL OMNIPAQUE IOHEXOL 350 MG/ML SOLN

[Series 4: routine abd pel with · axial · 0.92mm/px · z∈[-496,-352]mm · 3 of 88 slices shown]
[im 30/88  lung]
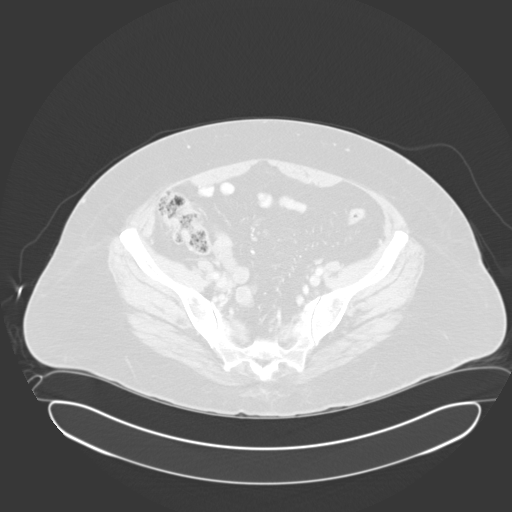
[im 58/88  lung]
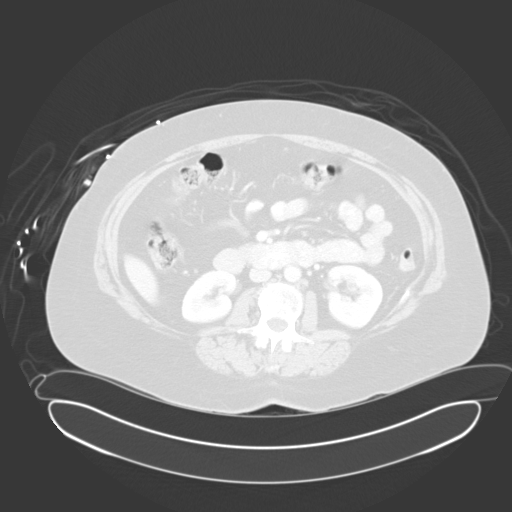
[im 59/88  lung]
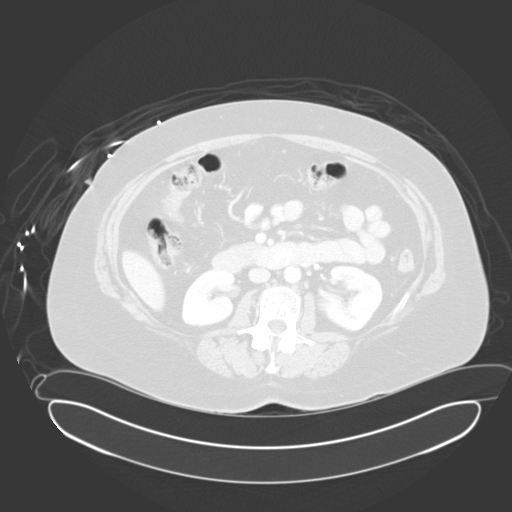

[Series 6: pe 1.0 thins · axial · 0.85mm/px · z∈[-286,-74]mm · 12 of 251 slices shown]
[im 20/251  lung]
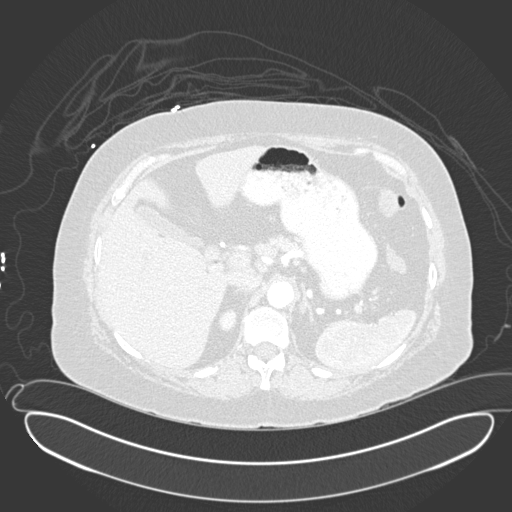
[im 39/251  mediastinal]
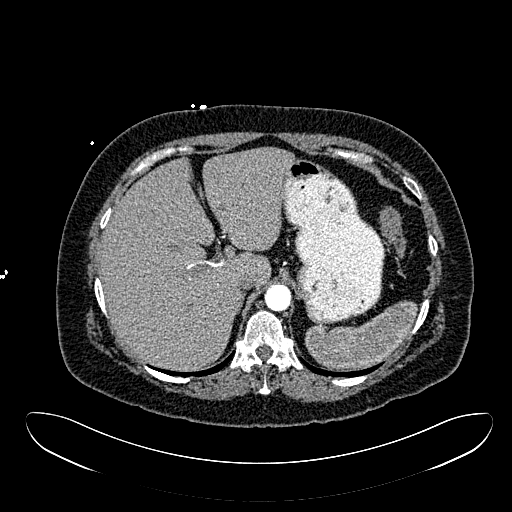
[im 58/251  lung]
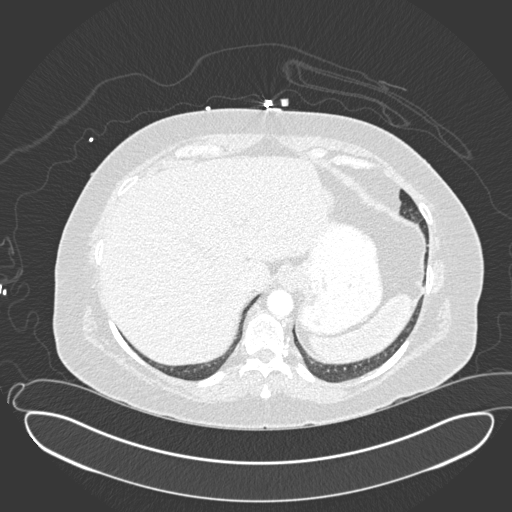
[im 77/251  mediastinal]
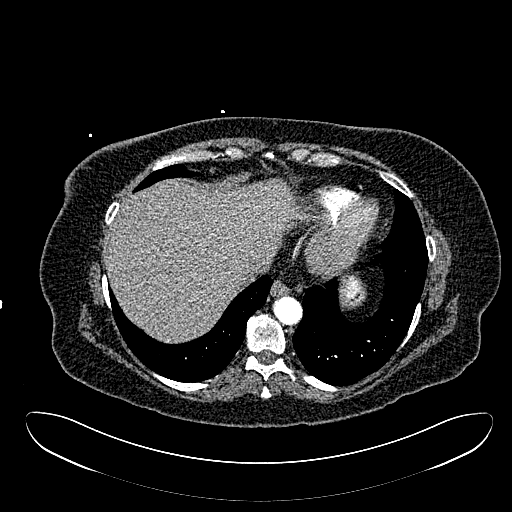
[im 97/251  lung]
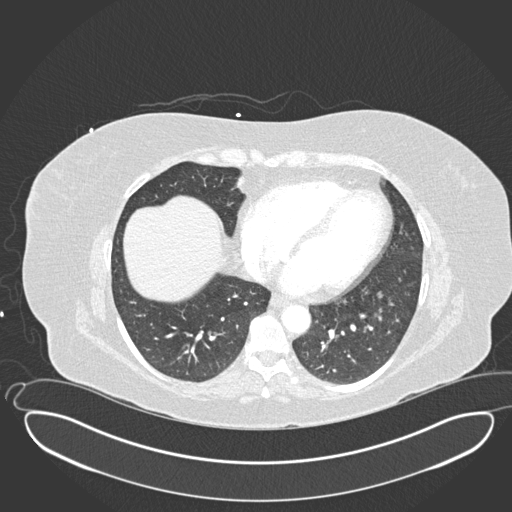
[im 116/251  mediastinal]
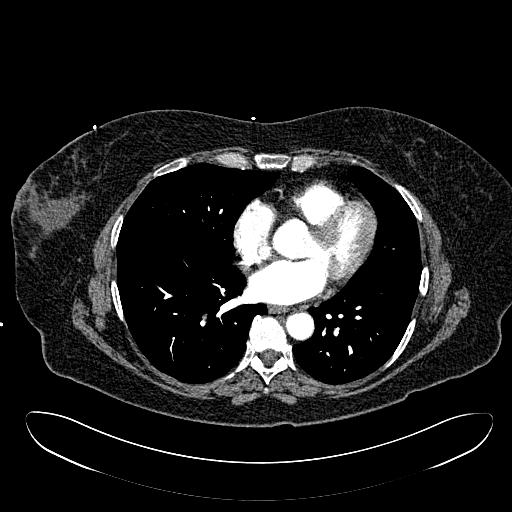
[im 135/251  lung]
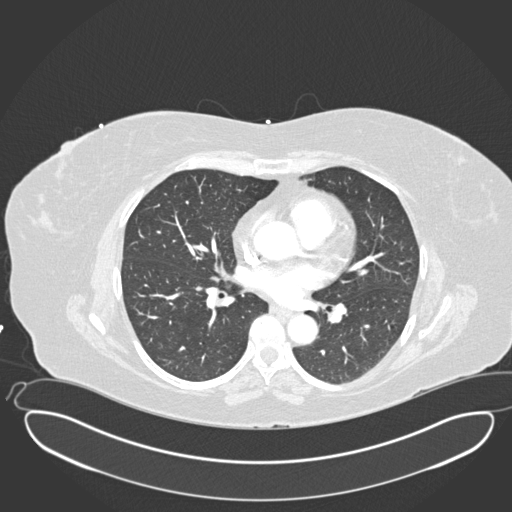
[im 154/251  mediastinal]
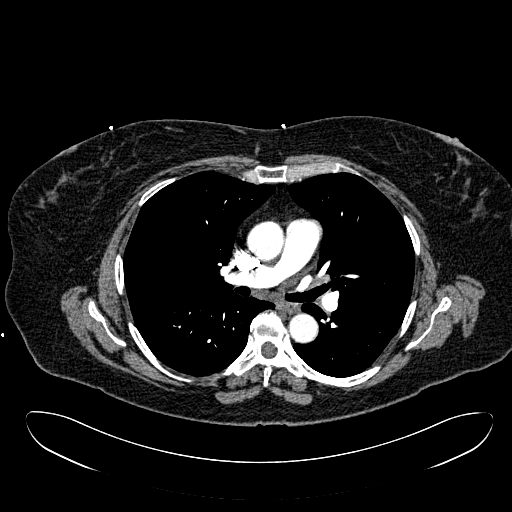
[im 174/251  lung]
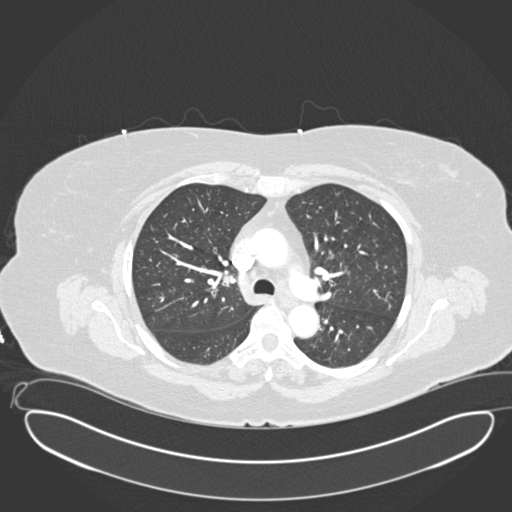
[im 193/251  mediastinal]
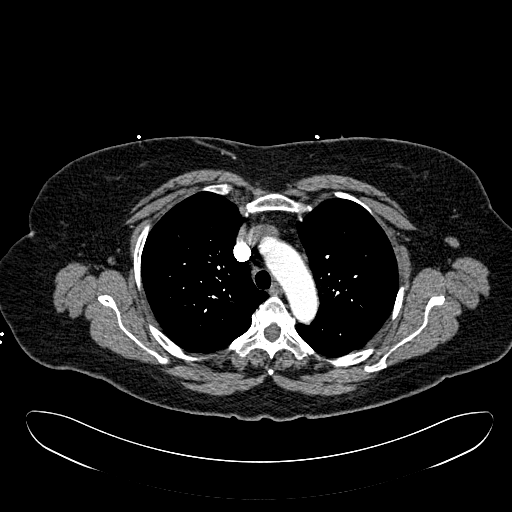
[im 212/251  lung]
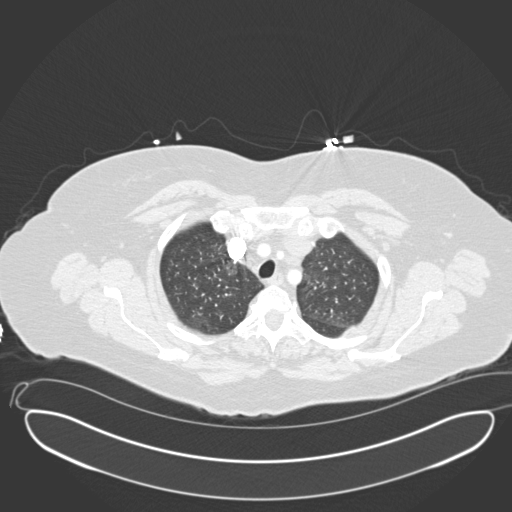
[im 231/251  mediastinal]
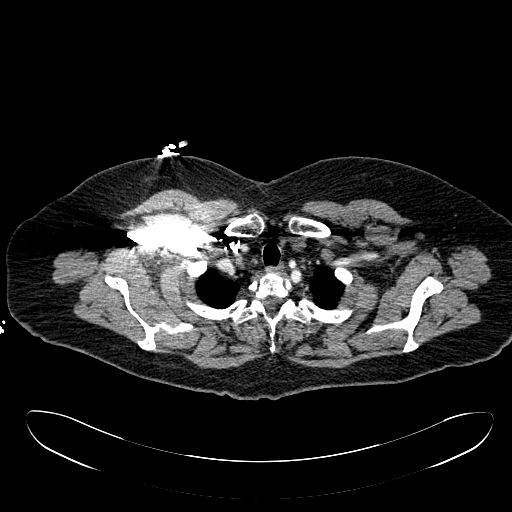

[15 of 30 positions shown; findings below may reference images not displayed]

FINDINGS: CTA CHEST FINDINGS

There is no evidence of pulmonary embolus.

A few tiny hazy peribronchial opacities are noted within the upper
lung lobes bilaterally, raising question for a mild acute infectious
process. There is no evidence of pleural effusion or pneumothorax.
No masses are identified; no abnormal focal contrast enhancement is
seen.

The mediastinum is unremarkable in appearance. No mediastinal
lymphadenopathy is seen. No pericardial effusion is identified. The
great vessels are grossly unremarkable in appearance. No axillary
lymphadenopathy is seen. The visualized portions of the thyroid
gland are unremarkable in appearance.

No acute osseous abnormalities are seen.

CT ABDOMEN and PELVIS FINDINGS

The liver and spleen are unremarkable in appearance. The gallbladder
is within normal limits. The pancreas and adrenal glands are
unremarkable.

The kidneys are unremarkable in appearance. There is no evidence of
hydronephrosis. No renal or ureteral stones are seen. No perinephric
stranding is appreciated.

No free fluid is identified. The small bowel is unremarkable in
appearance. The stomach is within normal limits. No acute vascular
abnormalities are seen. Mild calcification is noted along the
abdominal aorta and its branches.

The patient status post appendectomy. The colon is unremarkable in
appearance.

The bladder is decompressed and not well assessed. The uterus is
unremarkable in appearance. The ovaries are relatively symmetric. No
suspicious adnexal masses are seen. No inguinal lymphadenopathy is
seen.

No acute osseous abnormalities are identified.

Review of the MIP images confirms the above findings.
IMPRESSION: 1. No evidence of pulmonary embolus.
2. Few tiny hazy peribronchial opacities within the upper lung lobes
bilaterally, raising question for a mild acute infectious process.
3. Mild calcification along the abdominal aorta and its branches.

## 2016-01-28 ENCOUNTER — Ambulatory Visit: Payer: Managed Care, Other (non HMO) | Admitting: General Surgery

## 2016-01-28 ENCOUNTER — Other Ambulatory Visit: Payer: Self-pay | Admitting: Family Medicine

## 2016-01-28 MED ORDER — LISINOPRIL 5 MG PO TABS: 5.0000 mg | ORAL_TABLET | Freq: Every day | ORAL | Status: DC

## 2016-03-01 ENCOUNTER — Encounter: Payer: Self-pay | Admitting: Family Medicine

## 2016-03-01 ENCOUNTER — Ambulatory Visit (INDEPENDENT_AMBULATORY_CARE_PROVIDER_SITE_OTHER): Payer: Managed Care, Other (non HMO) | Admitting: Family Medicine

## 2016-03-01 VITALS — BP 122/80 | HR 77 | Temp 98.0°F | Ht 62.7 in | Wt 191.0 lb

## 2016-03-01 DIAGNOSIS — I10 Essential (primary) hypertension: Secondary | ICD-10-CM | POA: Diagnosis not present

## 2016-03-01 MED ORDER — LISINOPRIL 5 MG PO TABS: 5.0000 mg | ORAL_TABLET | Freq: Every day | ORAL | Status: DC

## 2016-03-01 NOTE — Progress Notes (Signed)
BP 122/80 mmHg  Pulse 77  Temp(Src) 98 F (36.7 C)  Ht 5' 2.7" (1.593 m)  Wt 191 lb (86.637 kg)  BMI 34.14 kg/m2  SpO2 99%   Subjective:    Patient ID: Meagan York, female    DOB: [birth year], 58 y.o.   MRN: [id number]  HPI: Meagan York is a 60 y.o. female  Chief Complaint  Patient presents with  . Hypertension   HYPERTENSION Hypertension status: better  Satisfied with current treatment? yes Duration of hypertension: Newlly diagoned BP monitoring frequency:  not checking BP medication side effects:  no Medication compliance: excellent compliance Previous BP meds: lisinopril Aspirin: no Recurrent headaches: no Visual changes: no Palpitations: no Dyspnea: yes- chronic Chest pain: no Lower extremity edema: no Dizzy/lightheaded: no  Relevant past medical, surgical, family and social history reviewed and updated as indicated. Interim medical history since our last visit reviewed. Allergies and medications reviewed and updated.  Review of Systems  Constitutional: Negative.   Respiratory: Negative.   Cardiovascular: Negative.   Psychiatric/Behavioral: Negative.     Per HPI unless specifically indicated above     Objective:    BP 122/80 mmHg  Pulse 77  Temp(Src) 98 F (36.7 C)  Ht 5' 2.7" (1.593 m)  Wt 191 lb (86.637 kg)  BMI 34.14 kg/m2  SpO2 99%  Wt Readings from Last 3 Encounters:  03/01/16 191 lb (86.637 kg)  11/24/15 190 lb (86.183 kg)  10/24/15 189 lb (85.73 kg)    Physical Exam  Constitutional: She is oriented to person, place, and time. She appears well-developed and well-nourished. No distress.  HENT:  Head: Normocephalic and atraumatic.  Right Ear: Hearing normal.  Left Ear: Hearing normal.  Nose: Nose normal.  Eyes: Conjunctivae and lids are normal. Right eye exhibits no discharge. Left eye exhibits no discharge. No scleral icterus.  Cardiovascular: Normal rate, regular rhythm, normal heart sounds and intact distal pulses.  Exam reveals  no gallop and no friction rub.   No murmur heard. Pulmonary/Chest: Effort normal and breath sounds normal. No respiratory distress. She has no wheezes. She has no rales. She exhibits no tenderness.  Musculoskeletal: Normal range of motion.  Neurological: She is alert and oriented to person, place, and time.  Skin: Skin is warm, dry and intact. No rash noted. She is not diaphoretic. No erythema. No pallor.  Psychiatric: She has a normal mood and affect. Her speech is normal and behavior is normal. Judgment and thought content normal. Cognition and memory are normal.  Nursing note and vitals reviewed.   Results for orders placed or performed in visit on 11/24/15  Microscopic Examination  Result Value Ref Range   WBC, UA None seen 0 -  5 /hpf   RBC, UA 0-2 0 -  2 /hpf   Epithelial Cells (non renal) None seen 0 - 10 /hpf  CBC with Differential/Platelet  Result Value Ref Range   WBC 6.1 3.4 - 10.8 x10E3/uL   RBC 3.93 3.77 - 5.28 x10E6/uL   Hemoglobin 10.8 (L) 11.1 - 15.9 g/dL   Hematocrit 34.2 34.0 - 46.6 %   MCV 87 79 - 97 fL   MCH 27.5 26.6 - 33.0 pg   MCHC 31.6 31.5 - 35.7 g/dL   RDW 16.6 (H) 12.3 - 15.4 %   Platelets 352 150 - 379 x10E3/uL   Neutrophils 53 %   Lymphs 32 %   Monocytes 11 %   Eos 3 %   Basos 1 %  Neutrophils Absolute 3.3 1.4 - 7.0 x10E3/uL   Lymphocytes Absolute 2.0 0.7 - 3.1 x10E3/uL   Monocytes Absolute 0.6 0.1 - 0.9 x10E3/uL   EOS (ABSOLUTE) 0.2 0.0 - 0.4 x10E3/uL   Basophils Absolute 0.0 0.0 - 0.2 x10E3/uL   Immature Granulocytes 0 %   Immature Grans (Abs) 0.0 0.0 - 0.1 x10E3/uL  Comprehensive metabolic panel  Result Value Ref Range   Glucose 85 65 - 99 mg/dL   BUN 9 6 - 24 mg/dL   Creatinine, Ser 0.63 0.57 - 1.00 mg/dL   GFR calc non Af Amer 99 >59 mL/min/1.73   GFR calc Af Amer 114 >59 mL/min/1.73   BUN/Creatinine Ratio 14 9 - 23   Sodium 138 134 - 144 mmol/L   Potassium 4.4 3.5 - 5.2 mmol/L   Chloride 100 96 - 106 mmol/L   CO2 23 18 - 29 mmol/L    Calcium 9.0 8.7 - 10.2 mg/dL   Total Protein 6.4 6.0 - 8.5 g/dL   Albumin 3.9 3.5 - 5.5 g/dL   Globulin, Total 2.5 1.5 - 4.5 g/dL   Albumin/Globulin Ratio 1.6 1.2 - 2.2   Bilirubin Total <0.2 0.0 - 1.2 mg/dL   Alkaline Phosphatase 110 39 - 117 IU/L   AST 17 0 - 40 IU/L   ALT 17 0 - 32 IU/L  HIV antibody  Result Value Ref Range   HIV Screen 4th Generation wRfx Non Reactive Non Reactive  Lipid Panel w/o Chol/HDL Ratio  Result Value Ref Range   Cholesterol, Total 201 (H) 100 - 199 mg/dL   Triglycerides 191 (H) 0 - 149 mg/dL   HDL 61 >39 mg/dL   VLDL Cholesterol Cal 38 5 - 40 mg/dL   LDL Calculated 102 (H) 0 - 99 mg/dL  Microalbumin, Urine Waived  Result Value Ref Range   Microalb, Ur Waived 10 0 - 19 mg/L   Creatinine, Urine Waived 10 10 - 300 mg/dL   Microalb/Creat Ratio <30 <30 mg/g  TSH  Result Value Ref Range   TSH 1.660 0.450 - 4.500 uIU/mL  UA/M w/rflx Culture, Routine  Result Value Ref Range   Specific Gravity, UA <1.005 (L) 1.005 - 1.030   pH, UA 5.0 5.0 - 7.5   Color, UA Yellow Yellow   Appearance Ur Clear Clear   Leukocytes, UA Negative Negative   Protein, UA Negative Negative/Trace   Glucose, UA Negative Negative   Ketones, UA Negative Negative   RBC, UA Trace (A) Negative   Bilirubin, UA Negative Negative   Urobilinogen, Ur 0.2 0.2 - 1.0 mg/dL   Nitrite, UA Negative Negative   Microscopic Examination See below:   Hepatitis C Antibody  Result Value Ref Range   Hep C Virus Ab <0.1 0.0 - 0.9 s/co ratio      Assessment & Plan:   Problem List Items Addressed This Visit      Cardiovascular and Mediastinum   HTN (hypertension) - Primary    Under good control on recheck. Continue current regimen. Continue to monitor. Call with any concerns. Recheck 6 months.       Relevant Medications   lisinopril (PRINIVIL,ZESTRIL) 5 MG tablet   Other Relevant Orders   Basic metabolic panel       Follow up plan: Return in about 6 months (around 08/31/2016) for  Physical.

## 2016-03-01 NOTE — Assessment & Plan Note (Signed)
Under good control on recheck. Continue current regimen. Continue to monitor. Call with any concerns. Recheck 6 months.  

## 2016-03-02 ENCOUNTER — Encounter: Payer: Self-pay | Admitting: Family Medicine

## 2016-03-02 LAB — BASIC METABOLIC PANEL
BUN / CREAT RATIO: 14 (ref 9–23)
BUN: 12 mg/dL (ref 6–24)
CHLORIDE: 101 mmol/L (ref 96–106)
CO2: 25 mmol/L (ref 18–29)
Calcium: 8.7 mg/dL (ref 8.7–10.2)
Creatinine, Ser: 0.84 mg/dL (ref 0.57–1.00)
GFR, EST AFRICAN AMERICAN: 89 mL/min/{1.73_m2} (ref >59)
GFR, EST NON AFRICAN AMERICAN: 77 mL/min/{1.73_m2} (ref >59)
Glucose: 92 mg/dL (ref 65–99)
Potassium: 4.2 mmol/L (ref 3.5–5.2)
Sodium: 138 mmol/L (ref 134–144)

## 2016-03-08 ENCOUNTER — Telehealth: Payer: Self-pay | Admitting: Family Medicine

## 2016-03-08 MED ORDER — ALBUTEROL SULFATE HFA 108 (90 BASE) MCG/ACT IN AERS: 1.0000 | INHALATION_SPRAY | RESPIRATORY_TRACT | Status: DC | PRN

## 2016-03-08 NOTE — Telephone Encounter (Signed)
Forward to provider

## 2016-03-08 NOTE — Telephone Encounter (Signed)
Needs Albuterol called into [facility].  Pt thought it would be called in during her visit on the 19th.

## 2016-03-08 NOTE — Telephone Encounter (Signed)
Rx sent to her pharmacy 

## 2016-03-09 ENCOUNTER — Encounter: Payer: Self-pay | Admitting: *Deleted

## 2016-03-23 ENCOUNTER — Other Ambulatory Visit: Payer: Self-pay | Admitting: Family Medicine

## 2016-05-18 ENCOUNTER — Other Ambulatory Visit: Payer: Self-pay | Admitting: Family Medicine

## 2016-06-17 ENCOUNTER — Telehealth: Payer: Self-pay | Admitting: Family Medicine

## 2016-06-17 ENCOUNTER — Encounter: Payer: Self-pay | Admitting: Family Medicine

## 2016-06-17 ENCOUNTER — Ambulatory Visit (INDEPENDENT_AMBULATORY_CARE_PROVIDER_SITE_OTHER): Payer: Managed Care, Other (non HMO) | Admitting: Family Medicine

## 2016-06-17 VITALS — BP 153/99 | HR 96 | Temp 98.8°F | Wt 193.0 lb

## 2016-06-17 DIAGNOSIS — N390 Urinary tract infection, site not specified: Secondary | ICD-10-CM

## 2016-06-17 MED ORDER — SULFAMETHOXAZOLE-TRIMETHOPRIM 800-160 MG PO TABS: 1.0000 | ORAL_TABLET | Freq: Two times a day (BID) | ORAL | 0 refills | Status: DC

## 2016-06-17 MED ORDER — PHENAZOPYRIDINE HCL 200 MG PO TABS: 200.0000 mg | ORAL_TABLET | Freq: Three times a day (TID) | ORAL | 0 refills | Status: DC | PRN

## 2016-06-17 NOTE — Progress Notes (Signed)
   BP (!) 153/99   Pulse 96   Temp 98.8 F (37.1 C)   Wt 193 lb (87.5 kg)   SpO2 99%   BMI 34.52 kg/m    Subjective:    Patient ID: Meagan York, female    DOB: [birth year], 58 y.o.   MRN: [id number]  HPI: Meagan York is a 60 y.o. female  Chief Complaint  Patient presents with  . Urinary Tract Infection    started on Saturday. Frequency, burning, pressure. Feels crummy.    Patient presents with 5 day history of urinary frequency, dysuria, and pelvic pressure. May have noticed some chills off and on, but no fevers. Mild b/l low back pain. No N/V/D. Has not tried anything OTC. Has not had a UTI since her teenage years.    Relevant past medical, surgical, family and social history reviewed and updated as indicated. Interim medical history since our last visit reviewed. Allergies and medications reviewed and updated.  Review of Systems  Constitutional: Positive for chills.  HENT: Negative.   Respiratory: Negative.   Cardiovascular: Negative.   Gastrointestinal: Negative.   Genitourinary: Positive for dysuria, frequency and urgency.  Musculoskeletal: Positive for back pain.  Skin: Negative.   Neurological: Negative.   Psychiatric/Behavioral: Negative.     Per HPI unless specifically indicated above     Objective:    BP (!) 153/99   Pulse 96   Temp 98.8 F (37.1 C)   Wt 193 lb (87.5 kg)   SpO2 99%   BMI 34.52 kg/m   Wt Readings from Last 3 Encounters:  06/17/16 193 lb (87.5 kg)  03/01/16 191 lb (86.6 kg)  11/24/15 190 lb (86.2 kg)    Physical Exam  Constitutional: She is oriented to person, place, and time. She appears well-developed and well-nourished. No distress.  HENT:  Head: Atraumatic.  Eyes: Conjunctivae are normal. No scleral icterus.  Neck: Normal range of motion. Neck supple.  Cardiovascular: Normal rate, regular rhythm and normal heart sounds.   Pulmonary/Chest: Effort normal and breath sounds normal. No respiratory distress.  Abdominal: Soft.  Bowel sounds are normal. She exhibits no distension. There is tenderness (Very mild suprapubic tenderness to palpation).  Musculoskeletal: Normal range of motion.  No CVA tenderness b/l  Neurological: She is alert and oriented to person, place, and time.  Skin: Skin is warm and dry.  Psychiatric: She has a normal mood and affect. Her behavior is normal.  Nursing note and vitals reviewed.     Assessment & Plan:   Problem List Items Addressed This Visit    None    Visit Diagnoses    Acute lower UTI    -  Primary   Bactrim and AZO sent. Recommended drinking lots of water, cranberry juice, probiotics. Follow up if no improvement. Await cx.    Relevant Medications   sulfamethoxazole-trimethoprim (BACTRIM DS,SEPTRA DS) 800-160 MG tablet   phenazopyridine (PYRIDIUM) 200 MG tablet   Other Relevant Orders   UA/M w/rflx Culture, Routine (STAT) (Completed)       Follow up plan: Return if symptoms worsen or fail to improve.

## 2016-06-17 NOTE — Patient Instructions (Signed)
Follow up as needed

## 2016-06-23 LAB — UA/M W/RFLX CULTURE, ROUTINE
BILIRUBIN UA: NEGATIVE
Glucose, UA: NEGATIVE
Ketones, UA: NEGATIVE
Nitrite, UA: NEGATIVE
PH UA: 6.5 (ref 5.0–7.5)
PROTEIN UA: NEGATIVE
Specific Gravity, UA: 1.005 (ref 1.005–1.030)
UUROB: 0.2 mg/dL (ref 0.2–1.0)

## 2016-06-23 LAB — MICROSCOPIC EXAMINATION

## 2016-06-23 LAB — URINE CULTURE, REFLEX

## 2016-06-24 NOTE — Telephone Encounter (Signed)
Patient called stating that she thought she had a kidney infection. Patient was made an appointment.

## 2016-07-13 ENCOUNTER — Other Ambulatory Visit: Payer: Self-pay | Admitting: Family Medicine

## 2016-10-07 ENCOUNTER — Emergency Department
Admission: EM | Admit: 2016-10-07 | Discharge: 2016-10-07 | Disposition: A | Discharge status: Home / Self Care | Payer: Managed Care, Other (non HMO) | Attending: Emergency Medicine | Admitting: Emergency Medicine

## 2016-10-07 ENCOUNTER — Encounter: Payer: Self-pay | Admitting: Emergency Medicine

## 2016-10-07 DIAGNOSIS — F17210 Nicotine dependence, cigarettes, uncomplicated: Secondary | ICD-10-CM | POA: Diagnosis not present

## 2016-10-07 DIAGNOSIS — Y929 Unspecified place or not applicable: Secondary | ICD-10-CM | POA: Diagnosis not present

## 2016-10-07 DIAGNOSIS — I10 Essential (primary) hypertension: Secondary | ICD-10-CM | POA: Diagnosis not present

## 2016-10-07 DIAGNOSIS — Z79899 Other long term (current) drug therapy: Secondary | ICD-10-CM | POA: Insufficient documentation

## 2016-10-07 DIAGNOSIS — S51851A Open bite of right forearm, initial encounter: Secondary | ICD-10-CM | POA: Diagnosis present

## 2016-10-07 DIAGNOSIS — W540XXA Bitten by dog, initial encounter: Secondary | ICD-10-CM | POA: Diagnosis not present

## 2016-10-07 DIAGNOSIS — Y939 Activity, unspecified: Secondary | ICD-10-CM | POA: Diagnosis not present

## 2016-10-07 DIAGNOSIS — Y999 Unspecified external cause status: Secondary | ICD-10-CM | POA: Insufficient documentation

## 2016-10-07 DIAGNOSIS — S51811A Laceration without foreign body of right forearm, initial encounter: Secondary | ICD-10-CM | POA: Diagnosis not present

## 2016-10-07 MED ORDER — HYDROCODONE-ACETAMINOPHEN 5-325 MG PO TABS
1.0000 | ORAL_TABLET | Freq: Once | ORAL | Status: AC
  Administered 2016-10-07: 1 via ORAL
  Filled 2016-10-07: qty 1

## 2016-10-07 MED ORDER — AMOXICILLIN-POT CLAVULANATE 875-125 MG PO TABS: 1.0000 | ORAL_TABLET | Freq: Two times a day (BID) | ORAL | 0 refills | Status: DC

## 2016-10-07 MED ORDER — HYDROCODONE-ACETAMINOPHEN 5-325 MG PO TABS: 1.0000 | ORAL_TABLET | Freq: Three times a day (TID) | ORAL | 0 refills | Status: DC | PRN

## 2016-10-07 MED ORDER — METHOCARBAMOL 500 MG PO TABS
750.0000 mg | ORAL_TABLET | Freq: Once | ORAL | Status: AC
  Administered 2016-10-07: 750 mg via ORAL
  Filled 2016-10-07: qty 2

## 2016-10-07 MED ORDER — AMOXICILLIN-POT CLAVULANATE 875-125 MG PO TABS
1.0000 | ORAL_TABLET | Freq: Once | ORAL | Status: AC
  Administered 2016-10-07: 1 via ORAL
  Filled 2016-10-07: qty 1

## 2016-10-07 MED ORDER — LIDOCAINE-EPINEPHRINE-TETRACAINE (LET) SOLUTION
3.0000 mL | Freq: Once | NASAL | Status: AC
  Administered 2016-10-07: 3 mL via TOPICAL
  Filled 2016-10-07: qty 3

## 2016-10-07 MED ORDER — CYCLOBENZAPRINE HCL 5 MG PO TABS: 5.0000 mg | ORAL_TABLET | Freq: Three times a day (TID) | ORAL | 0 refills | Status: DC | PRN

## 2016-10-07 NOTE — Discharge Instructions (Signed)
Keep the wound clean, dry, and covered. Take the antibiotic as directed. Take the pain & spasm medicines as needed. Apply ice to reduce swelling. Use your right hand/arm as tolerated. Follow-up with your provider for continued symptoms. Follow-up with animal control for the quarantine status of the stray dog. Return to the ED for rabies vaccination if advised.

## 2016-10-07 NOTE — ED Provider Notes (Signed)
[facility] Emergency Department Provider Note ____________________________________________  Time seen: 1536  I have reviewed the triage vital signs and the nursing notes.  HISTORY  Chief Complaint  Animal Bite  HPI Meagan York is a 61 y.o. left-handed female, who presents  to the ED for evaluation of bites to the right forearm. The patient describes she was bitten by an unknown dog in her neighborhood. She describes walking to check her mail box, when a small black, short haired dog approached her. The dog was wagging its tail and allow the patient to pet it on the head after it snipped her hand. When she stopped petting the dog, it jumped up and bit her on the right forearm. She reports her tetanus is up-to-date as of 2014. She reports pain, stiffness, and multiple bites to the right forearm.   Past Medical History:  Diagnosis Date  . Arthritis   . Bilateral breast cysts   . 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 Complication of anesthesia    pt woke up during breast surgery (1995)  . COPD (chronic obstructive pulmonary disease) ([place])   . GERD (gastroesophageal reflux disease)   . Hemorrhoids   . Irritable bowel disease     Patient Active Problem List   Diagnosis Date Noted  . HTN (hypertension) 11/24/2015  . Tobacco abuse 11/24/2015  . Heel pain, bilateral 10/24/2015  . Arthritis   . COPD (chronic obstructive pulmonary disease) ([place])   . GERD (gastroesophageal reflux disease)   . Upper GI bleed 07/05/2015  . Cancer ([place]) 04/25/2015    Past Surgical History:  Procedure Laterality Date  . APPENDECTOMY    . BREAST BIOPSY Right 01-01-15   BIPHASIC FIBROEPITHELIAL LESION with ADH  . BREAST LUMPECTOMY Right 04/25/2015   Procedure: RIGHT BREAST LUMPECTOMY, exicision of skin tag right face;  Surgeon: Christene Lye, MD;  Location: [facility] ORS;  Service: General;  Laterality: Right;  . BREAST  MAMMOSITE Right 05-21-15  . BREAST SURGERY Left    cyst removal  . COLONOSCOPY WITH PROPOFOL N/A 07/07/2015   Procedure: COLONOSCOPY WITH PROPOFOL;  Surgeon: Josefine Class, MD;  Location: [facility] ENDOSCOPY;  Service: Endoscopy;  Laterality: N/A;  . cyst lower spine    . ESOPHAGOGASTRODUODENOSCOPY (EGD) WITH PROPOFOL N/A 07/07/2015   Procedure: ESOPHAGOGASTRODUODENOSCOPY (EGD) WITH PROPOFOL;  Surgeon: Josefine Class, MD;  Location: [facility] ENDOSCOPY;  Service: Endoscopy;  Laterality: N/A;  . KNEE SURGERY    . piondial cyst excision  03/1989    Prior to Admission medications   Medication Sig Start Date End Date Taking? Authorizing Provider  albuterol (PROAIR HFA) 108 (90 Base) MCG/ACT inhaler Inhale 1-2 puffs into the lungs every 4 (four) hours as needed for wheezing or shortness of breath. 03/08/16   Megan P Johnson, DO  amoxicillin-clavulanate (AUGMENTIN) 875-125 MG tablet Take 1 tablet by mouth 2 (two) times daily. 10/07/16   Elih Mooney V Bacon Gaylan Fauver, PA-C  celecoxib (CELEBREX) 200 MG capsule  11/20/15   Historical Provider, MD  Cholecalciferol (VITAMIN D3) 1000 units CAPS Take 1,000 Units by mouth daily.    Historical Provider, MD  cyclobenzaprine (FLEXERIL) 5 MG tablet Take 1 tablet (5 mg total) by mouth 3 (three) times daily as needed for muscle spasms. 10/07/16   Gurnoor Sloop V Bacon Altha Sweitzer, PA-C  HYDROcodone-acetaminophen (NORCO) 5-325 MG tablet Take 1 tablet by mouth 3 (three) times daily as needed. 10/07/16   Alita Chyle  Bacon Danny Yackley, PA-C  lisinopril (PRINIVIL,ZESTRIL) 5 MG tablet Take 1 tablet (5 mg total) by mouth daily. 03/01/16   Megan P Johnson, DO  pantoprazole (PROTONIX) 40 MG tablet TAKE 1 TABLET (40 MG TOTAL) BY MOUTH 2 (TWO) TIMES DAILY. 07/13/16   Megan P Johnson, DO  phenazopyridine (PYRIDIUM) 200 MG tablet Take 1 tablet (200 mg total) by mouth 3 (three) times daily as needed for pain. 06/17/16   Volney American, PA-C  sulfamethoxazole-trimethoprim (BACTRIM DS,SEPTRA DS) 800-160 MG  tablet Take 1 tablet by mouth 2 (two) times daily. 06/17/16   Volney American, PA-C    Allergies Aspirin and Tramadol  Family History  Problem Relation Age of Onset  . Colon cancer Father     age 67  . Anemia Father   . Diabetes Father   . Hypertension Father   . Diverticulosis Mother   . COPD Mother   . Hypertension Brother   . Alzheimer's disease Paternal Grandfather     Social History Social History  Substance Use Topics  . Smoking status: Current Every Day Smoker    Packs/day: 1.00    Years: 40.00    Types: Cigarettes  . Smokeless tobacco: Never Used  . Alcohol use No    Review of Systems  Constitutional: Negative for fever. Cardiovascular: Negative for chest pain. Respiratory: Negative for shortness of breath. Musculoskeletal: Negative for back pain. Skin: Negative for rash. Multiple lacerations as above.  Neurological: Negative for headaches, focal weakness or numbness. ____________________________________________  PHYSICAL EXAM:  VITAL SIGNS: ED Triage Vitals  Enc Vitals Group     BP 10/07/16 1532 (!) 156/100     Pulse Rate 10/07/16 1532 (!) 103     Resp 10/07/16 1532 20     Temp 10/07/16 1532 98.5 F (36.9 C)     Temp Source 10/07/16 1532 Oral     SpO2 10/07/16 1532 96 %     Weight 10/07/16 1533 195 lb (88.5 kg)     Height 10/07/16 1533 5\' 5"  (1.651 m)     Head Circumference --      Peak Flow --      Pain Score 10/07/16 1533 8     Pain Loc --      Pain Edu? --      Excl. in [place]? --     Constitutional: Alert and oriented. Well appearing and in no distress. Head: Normocephalic and atraumatic. Cardiovascular: Normal rate, regular rhythm. Normal distal pulses. Respiratory: Normal respiratory effort. No wheezes/rales/rhonchi. Musculoskeletal: Patient with multiple lacerations to the right forearm through the subcutaneous skin. Normal composite fist. Nontender with normal range of motion in all extremities.  Neurologic:  Normal gross sensation.  Normal speech and language. No gross focal neurologic deficits are appreciated. Skin:  Skin is warm, dry and intact. No rash noted. ____________________________________________  PROCEDURES  Augmentin 875 mg PO Norco 5-325 mg PO Arm sling Wound dressings  LACERATION REPAIR Performed by: Melvenia Needles Authorized by: Melvenia Needles Consent: Verbal consent obtained. Risks and benefits: risks, benefits and alternatives were discussed Consent given by: patient Patient identity confirmed: provided demographic data Prepped and Draped in normal sterile fashion Wound explored  Laceration Location: right forearm  Laceration Length: multiple lacerations from 0.5-3.5 cm  No Foreign Bodies seen or palpated  Anesthesia: topical infiltration  Local anesthetic: lidocaine-epinephrine-tetracaine  Anesthetic total: 3 ml  Irrigation method: syringe Amount of cleaning: standard  Skin closure: Steri-strips  Patient tolerance: Patient tolerated the procedure well  with no immediate complications. ____________________________________________  INITIAL IMPRESSION / ASSESSMENT AND PLAN / ED COURSE  Patient with initial wound care management for multiple dog bites of the right forearm. She is discharged with parents for hydrocodone, Flexeril, and Augmentin the dose as directed. She is given wound care options and strict return to precautions. She will follow with her primary care provider in 3 days for wound check or return to the ED as needed. She's been in touch with animal control and will monitor and follow-up with them for rabies vaccine administration instructions. She is provided with a work note for 1-2 days as needed. ____________________________________________  FINAL CLINICAL IMPRESSION(S) / ED DIAGNOSES  Final diagnoses:  Dog bite, initial encounter      Melvenia Needles, PA-C 10/07/16 [place], MD 10/07/16 2125

## 2016-10-07 NOTE — ED Triage Notes (Signed)
Patient states an unknown dog bit her right arm when she got out of the car. Patient has not called animal control or the police. Patient has several wounds on the right arm, bleeding freely.

## 2016-10-12 ENCOUNTER — Other Ambulatory Visit: Payer: Self-pay | Admitting: Family Medicine

## 2016-10-12 ENCOUNTER — Other Ambulatory Visit: Payer: Self-pay

## 2016-10-13 ENCOUNTER — Inpatient Hospital Stay: Payer: Managed Care, Other (non HMO) | Admitting: Family Medicine

## 2016-10-15 ENCOUNTER — Ambulatory Visit (INDEPENDENT_AMBULATORY_CARE_PROVIDER_SITE_OTHER): Payer: Managed Care, Other (non HMO) | Admitting: Family Medicine

## 2016-10-15 ENCOUNTER — Encounter: Payer: Self-pay | Admitting: Family Medicine

## 2016-10-15 VITALS — BP 148/88 | HR 104 | Temp 98.1°F | Ht 65.0 in | Wt 203.0 lb

## 2016-10-15 DIAGNOSIS — I10 Essential (primary) hypertension: Secondary | ICD-10-CM | POA: Diagnosis not present

## 2016-10-15 DIAGNOSIS — W540XXA Bitten by dog, initial encounter: Secondary | ICD-10-CM | POA: Diagnosis not present

## 2016-10-15 MED ORDER — HYDROCODONE-ACETAMINOPHEN 5-325 MG PO TABS: 1.0000 | ORAL_TABLET | Freq: Three times a day (TID) | ORAL | 0 refills | Status: DC | PRN

## 2016-10-15 MED ORDER — LISINOPRIL 10 MG PO TABS: 10.0000 mg | ORAL_TABLET | Freq: Every day | ORAL | 1 refills | Status: DC

## 2016-10-15 NOTE — Assessment & Plan Note (Signed)
Not under great control. Will increase to 10mg  daily on lisinopril and recheck 1 month.

## 2016-10-15 NOTE — Progress Notes (Signed)
BP (!) 148/88   Pulse (!) 104   Temp 98.1 F (36.7 C) (Oral)   Ht 5\' 5"  (1.651 m)   Wt 203 lb (92.1 kg)   SpO2 99%   BMI 33.78 kg/m    Subjective:    Patient ID: Meagan York, female    DOB: [birth year], 61 y.o.   MRN: [id number]  HPI: CORINA NIELAND is a 61 y.o. female  Chief Complaint  Patient presents with  . Hospitalization Follow-up    Patient with initial wound care management for multiple dog bites of the right forearm. She is discharged with parents for hydrocodone, Flexeril, and Augmentin the dose as directed. She is given wound care options and strict return to precautions. She will follow with her primary care provider in 3 days for wound check or return to the ED as needed. She's been in touch with animal control and will monitor and follow-up with them for rabies vaccine administration instructions. She is provided with a work   ER FOLLOW UP Time since discharge: 6 days ago Hospital/facility: [facility] Diagnosis: Dog bite Procedures/tests: None Consultants: None New medications: Augmentin, norco, flexeril Discharge instructions:  Follow up here Status: better  Relevant past medical, surgical, family and social history reviewed and updated as indicated. Interim medical history since our last visit reviewed. Allergies and medications reviewed and updated.  Review of Systems  Constitutional: Negative.   Respiratory: Negative.   Cardiovascular: Negative.   Musculoskeletal: Positive for myalgias. Negative for arthralgias, back pain, gait problem, joint swelling, neck pain and neck stiffness.  Psychiatric/Behavioral: Negative.     Per HPI unless specifically indicated above     Objective:    BP (!) 148/88   Pulse (!) 104   Temp 98.1 F (36.7 C) (Oral)   Ht 5\' 5"  (1.651 m)   Wt 203 lb (92.1 kg)   SpO2 99%   BMI 33.78 kg/m   Wt Readings from Last 3 Encounters:  10/15/16 203 lb (92.1 kg)  10/07/16 195 lb (88.5 kg)  06/17/16 193 lb (87.5 kg)    Physical  Exam  Constitutional: She is oriented to person, place, and time. She appears well-developed and well-nourished. No distress.  HENT:  Head: Normocephalic and atraumatic.  Right Ear: Hearing normal.  Left Ear: Hearing normal.  Nose: Nose normal.  Eyes: Conjunctivae and lids are normal. Right eye exhibits no discharge. Left eye exhibits no discharge. No scleral icterus.  Cardiovascular: Normal rate, regular rhythm, normal heart sounds and intact distal pulses.  Exam reveals no gallop and no friction rub.   No murmur heard. Pulmonary/Chest: Effort normal and breath sounds normal. No respiratory distress. She has no wheezes. She has no rales. She exhibits no tenderness.  Musculoskeletal: Normal range of motion.  Neurological: She is alert and oriented to person, place, and time.  Skin: Skin is warm, dry and intact. No rash noted. No erythema. No pallor.  Open wound on R forearm, healing well, significant bruising, no pus, very swollen. No heat  Psychiatric: She has a normal mood and affect. Her speech is normal and behavior is normal. Judgment and thought content normal. Cognition and memory are normal.  Nursing note and vitals reviewed.   Results for orders placed or performed in visit on 06/17/16  Microscopic Examination  Result Value Ref Range   WBC, UA 6-10 (A) 0 - 5 /hpf   RBC, UA 3-10 (A) 0 - 2 /hpf   Epithelial Cells (non renal) 0-10 0 - 10 /  hpf   Bacteria, UA Few None seen/Few  UA/M w/rflx Culture, Routine (STAT)  Result Value Ref Range   Specific Gravity, UA 1.005 1.005 - 1.030   pH, UA 6.5 5.0 - 7.5   Color, UA Yellow Yellow   Appearance Ur Clear Clear   Leukocytes, UA 3+ (A) Negative   Protein, UA Negative Negative/Trace   Glucose, UA Negative Negative   Ketones, UA Negative Negative   RBC, UA 2+ (A) Negative   Bilirubin, UA Negative Negative   Urobilinogen, Ur 0.2 0.2 - 1.0 mg/dL   Nitrite, UA Negative Negative   Microscopic Examination See below:    Urinalysis Reflex  Comment   Urine Culture, Routine  Result Value Ref Range   Urine Culture, Routine Final report (A)    Urine Culture result 1 Escherichia coli (A)    ANTIMICROBIAL SUSCEPTIBILITY Comment       Assessment & Plan:   Problem List Items Addressed This Visit      Cardiovascular and Mediastinum   HTN (hypertension)    Not under great control. Will increase to 10mg  daily on lisinopril and recheck 1 month.       Relevant Medications   lisinopril (PRINIVIL,ZESTRIL) 10 MG tablet    Other Visit Diagnoses    Dog bite, initial encounter    -  Primary   Will continue antibiotics. Keep covered. Recheck 1 wk, no sign of cellulitis at this time. Wounds healing, no need for wound consult at this time. Norco refill       Follow up plan: Return in about 1 week (around 10/22/2016) for follow up dog bite.

## 2016-10-22 ENCOUNTER — Ambulatory Visit: Payer: Managed Care, Other (non HMO) | Admitting: Family Medicine

## 2017-02-24 ENCOUNTER — Encounter: Payer: Self-pay | Admitting: Family Medicine

## 2017-02-24 ENCOUNTER — Ambulatory Visit (INDEPENDENT_AMBULATORY_CARE_PROVIDER_SITE_OTHER): Payer: Managed Care, Other (non HMO) | Admitting: Family Medicine

## 2017-02-24 VITALS — BP 148/90 | HR 80 | Temp 98.0°F | Wt 198.9 lb

## 2017-02-24 DIAGNOSIS — E559 Vitamin D deficiency, unspecified: Secondary | ICD-10-CM | POA: Diagnosis not present

## 2017-02-24 DIAGNOSIS — J449 Chronic obstructive pulmonary disease, unspecified: Secondary | ICD-10-CM | POA: Diagnosis not present

## 2017-02-24 DIAGNOSIS — I10 Essential (primary) hypertension: Secondary | ICD-10-CM | POA: Diagnosis not present

## 2017-02-24 DIAGNOSIS — R202 Paresthesia of skin: Secondary | ICD-10-CM

## 2017-02-24 DIAGNOSIS — K219 Gastro-esophageal reflux disease without esophagitis: Secondary | ICD-10-CM | POA: Diagnosis not present

## 2017-02-24 DIAGNOSIS — Z13220 Encounter for screening for lipoid disorders: Secondary | ICD-10-CM

## 2017-02-24 DIAGNOSIS — M79672 Pain in left foot: Secondary | ICD-10-CM | POA: Diagnosis not present

## 2017-02-24 DIAGNOSIS — M79671 Pain in right foot: Secondary | ICD-10-CM

## 2017-02-24 LAB — MICROSCOPIC EXAMINATION: Bacteria, UA: NONE SEEN

## 2017-02-24 LAB — UA/M W/RFLX CULTURE, ROUTINE
BILIRUBIN UA: NEGATIVE
Glucose, UA: NEGATIVE
KETONES UA: NEGATIVE
LEUKOCYTES UA: NEGATIVE
Nitrite, UA: NEGATIVE
Protein, UA: NEGATIVE
Urobilinogen, Ur: 0.2 mg/dL (ref 0.2–1.0)
pH, UA: 6 (ref 5.0–7.5)

## 2017-02-24 LAB — MICROALBUMIN, URINE WAIVED
Creatinine, Urine Waived: 50 mg/dL (ref 10–300)
Microalb, Ur Waived: 10 mg/L (ref 0–19)

## 2017-02-24 LAB — BAYER DCA HB A1C WAIVED: HB A1C (BAYER DCA - WAIVED): 5.3 % (ref <7.0)

## 2017-02-24 MED ORDER — LISINOPRIL 20 MG PO TABS: 20.0000 mg | ORAL_TABLET | Freq: Every day | ORAL | 1 refills | Status: DC

## 2017-02-24 MED ORDER — PANTOPRAZOLE SODIUM 40 MG PO TBEC: DELAYED_RELEASE_TABLET | ORAL | 1 refills | Status: DC

## 2017-02-24 MED ORDER — CELECOXIB 200 MG PO CAPS
200.0000 mg | ORAL_CAPSULE | Freq: Every day | ORAL | 1 refills | Status: DC
Start: 2017-02-24 — End: 2017-09-04

## 2017-02-24 NOTE — Assessment & Plan Note (Signed)
Doing well on celebrex. Rx sent to her pharmacy. Call with any concerns.

## 2017-02-24 NOTE — Assessment & Plan Note (Signed)
Will increase lisinopril to 20mg  and recheck in 1 month.

## 2017-02-24 NOTE — Progress Notes (Signed)
BP (!) 148/90   Pulse 80   Temp 98 F (36.7 C)   Wt 198 lb 14.4 oz (90.2 kg)   SpO2 97%   BMI 33.10 kg/m    Subjective:    Patient ID: Meagan York, female    DOB: 05-Nov-1955, 61 y.o.   MRN: [id number]  HPI: Meagan York is a 61 y.o. female  Chief Complaint  Patient presents with  . Foot Pain    Patient states that the foot doctor told her that she has to get her Celebrex from her PCP now  . Hypertension   FOOT PAIN- when she's on her celebrex, she does well, doesn't do well off it. Podiatry would like [place] to take over celebrex Duration: chronic Involved foot: bilateral Mechanism of injury: unknown Location:  Onset: gradual  Severity: mild  Quality:  aching Frequency: constant Radiation: no Aggravating factors: being on her feet   Alleviating factors: celebrex   Status: worse Treatments attempted: celebrex   Relief with NSAIDs?:  significant Weakness with weight bearing or walking: no Morning stiffness: no Swelling: no Redness: no Bruising: no Paresthesias / decreased sensation: no  Fevers:no  HYPERTENSION Hypertension status: stable  Satisfied with current treatment? no Duration of hypertension: chronic BP monitoring frequency:  not checking BP medication side effects:  no Medication compliance: excellent compliance Previous BP meds: losartan Aspirin: no Recurrent headaches: no Visual changes: no Palpitations: no Dyspnea: yes- with change in the weather Chest pain: no Lower extremity edema: no Dizzy/lightheaded: no  Relevant past medical, surgical, family and social history reviewed and updated as indicated. Interim medical history since our last visit reviewed. Allergies and medications reviewed and updated.  Review of Systems  Constitutional: Negative.   Respiratory: Negative.   Cardiovascular: Negative.   Musculoskeletal: Positive for arthralgias. Negative for back pain, gait problem, joint swelling, myalgias, neck pain and neck stiffness.   Psychiatric/Behavioral: Negative.     Per HPI unless specifically indicated above     Objective:    BP (!) 148/90   Pulse 80   Temp 98 F (36.7 C)   Wt 198 lb 14.4 oz (90.2 kg)   SpO2 97%   BMI 33.10 kg/m   Wt Readings from Last 3 Encounters:  02/24/17 198 lb 14.4 oz (90.2 kg)  10/15/16 203 lb (92.1 kg)  10/07/16 195 lb (88.5 kg)    Physical Exam  Constitutional: She is oriented to person, place, and time. She appears well-developed and well-nourished. No distress.  HENT:  Head: Normocephalic and atraumatic.  Right Ear: Hearing normal.  Left Ear: Hearing normal.  Nose: Nose normal.  Eyes: Conjunctivae and lids are normal. Right eye exhibits no discharge. Left eye exhibits no discharge. No scleral icterus.  Cardiovascular: Normal rate, regular rhythm, normal heart sounds and intact distal pulses.  Exam reveals no gallop and no friction rub.   No murmur heard. Pulmonary/Chest: Effort normal and breath sounds normal. No respiratory distress. She has no wheezes. She has no rales. She exhibits no tenderness.  Musculoskeletal: Normal range of motion.  Neurological: She is alert and oriented to person, place, and time.  Skin: Skin is warm, dry and intact. No rash noted. She is not diaphoretic. No erythema. No pallor.  Psychiatric: She has a normal mood and affect. Her speech is normal and behavior is normal. Judgment and thought content normal. Cognition and memory are normal.  Nursing note and vitals reviewed.   Results for orders placed or performed in visit on 06/17/16  Microscopic Examination  Result Value Ref Range   WBC, UA 6-10 (A) 0 - 5 /hpf   RBC, UA 3-10 (A) 0 - 2 /hpf   Epithelial Cells (non renal) 0-10 0 - 10 /hpf   Bacteria, UA Few None seen/Few  UA/M w/rflx Culture, Routine (STAT)  Result Value Ref Range   Specific Gravity, UA 1.005 1.005 - 1.030   pH, UA 6.5 5.0 - 7.5   Color, UA Yellow Yellow   Appearance Ur Clear Clear   Leukocytes, UA 3+ (A) Negative     Protein, UA Negative Negative/Trace   Glucose, UA Negative Negative   Ketones, UA Negative Negative   RBC, UA 2+ (A) Negative   Bilirubin, UA Negative Negative   Urobilinogen, Ur 0.2 0.2 - 1.0 mg/dL   Nitrite, UA Negative Negative   Microscopic Examination See below:    Urinalysis Reflex Comment   Urine Culture, Routine  Result Value Ref Range   Urine Culture, Routine Final report (A)    Organism ID, Bacteria Escherichia coli (A)    Antimicrobial Susceptibility Comment       Assessment & Plan:   Problem List Items Addressed This Visit      Cardiovascular and Mediastinum   HTN (hypertension) - Primary    Will increase lisinopril to 20mg  and recheck in 1 month.       Relevant Medications   lisinopril (PRINIVIL,ZESTRIL) 20 MG tablet   Other Relevant Orders   Comprehensive metabolic panel   Microalbumin, Urine Waived   TSH   UA/M w/rflx Culture, Routine     Respiratory   COPD (chronic obstructive pulmonary disease) (HCC)    Stable. No issues. Call with any concerns.         Digestive   GERD (gastroesophageal reflux disease)    Under good control on current regimen. Continue to monitor. Call with any concerns.       Relevant Medications   pantoprazole (PROTONIX) 40 MG tablet   Other Relevant Orders   CBC with Differential/Platelet   UA/M w/rflx Culture, Routine     Other   Heel pain, bilateral    Doing well on celebrex. Rx sent to her pharmacy. Call with any concerns.        Other Visit Diagnoses    Screening for cholesterol level       Labs drawn today. Await results.    Relevant Orders   Lipid Panel w/o Chol/HDL Ratio   Vitamin D deficiency       Rechecking levels today.   Relevant Orders   VITAMIN D 25 Hydroxy (Vit-D Deficiency, Fractures)   Paresthesias       Likely due to steel toed boots- but will check A1c await results.    Relevant Orders   Bayer DCA Hb A1c Waived       Follow up plan: Return in about 4 weeks (around 03/24/2017) for BP  follow up.

## 2017-02-24 NOTE — Assessment & Plan Note (Signed)
Under good control on current regimen. Continue to monitor. Call with any concerns.  

## 2017-02-24 NOTE — Assessment & Plan Note (Signed)
Stable. No issues. Call with any concerns.

## 2017-02-25 ENCOUNTER — Encounter: Payer: Self-pay | Admitting: Family Medicine

## 2017-02-25 LAB — TSH: TSH: 2.82 u[IU]/mL (ref 0.450–4.500)

## 2017-02-25 LAB — COMPREHENSIVE METABOLIC PANEL
ALK PHOS: 108 IU/L (ref 39–117)
ALT: 23 IU/L (ref 0–32)
AST: 21 IU/L (ref 0–40)
Albumin/Globulin Ratio: 1.6 (ref 1.2–2.2)
Albumin: 3.8 g/dL (ref 3.5–5.5)
BUN/Creatinine Ratio: 13 (ref 9–23)
BUN: 8 mg/dL (ref 6–24)
Bilirubin Total: 0.4 mg/dL (ref 0.0–1.2)
CALCIUM: 8.8 mg/dL (ref 8.7–10.2)
CO2: 26 mmol/L (ref 20–29)
Chloride: 102 mmol/L (ref 96–106)
Creatinine, Ser: 0.64 mg/dL (ref 0.57–1.00)
GFR calc Af Amer: 113 mL/min/{1.73_m2} (ref >59)
GFR, EST NON AFRICAN AMERICAN: 98 mL/min/{1.73_m2} (ref >59)
GLOBULIN, TOTAL: 2.4 g/dL (ref 1.5–4.5)
GLUCOSE: 68 mg/dL (ref 65–99)
Potassium: 3.8 mmol/L (ref 3.5–5.2)
Sodium: 143 mmol/L (ref 134–144)
Total Protein: 6.2 g/dL (ref 6.0–8.5)

## 2017-02-25 LAB — VITAMIN D 25 HYDROXY (VIT D DEFICIENCY, FRACTURES): Vit D, 25-Hydroxy: 19.8 ng/mL — ABNORMAL LOW (ref 30.0–100.0)

## 2017-02-25 LAB — CBC WITH DIFFERENTIAL/PLATELET
BASOS ABS: 0 10*3/uL (ref 0.0–0.2)
Basos: 1 %
EOS (ABSOLUTE): 0.2 10*3/uL (ref 0.0–0.4)
EOS: 3 %
HEMATOCRIT: 35.5 % (ref 34.0–46.6)
HEMOGLOBIN: 11.6 g/dL (ref 11.1–15.9)
IMMATURE GRANULOCYTES: 0 %
Immature Grans (Abs): 0 10*3/uL (ref 0.0–0.1)
LYMPHS ABS: 2.1 10*3/uL (ref 0.7–3.1)
Lymphs: 36 %
MCH: 28.9 pg (ref 26.6–33.0)
MCHC: 32.7 g/dL (ref 31.5–35.7)
MCV: 88 fL (ref 79–97)
MONOCYTES: 8 %
MONOS ABS: 0.5 10*3/uL (ref 0.1–0.9)
NEUTROS PCT: 52 %
Neutrophils Absolute: 3.1 10*3/uL (ref 1.4–7.0)
Platelets: 278 10*3/uL (ref 150–379)
RBC: 4.02 x10E6/uL (ref 3.77–5.28)
RDW: 14.5 % (ref 12.3–15.4)
WBC: 5.9 10*3/uL (ref 3.4–10.8)

## 2017-02-25 LAB — LIPID PANEL W/O CHOL/HDL RATIO
CHOLESTEROL TOTAL: 207 mg/dL — AB (ref 100–199)
HDL: 64 mg/dL (ref >39)
LDL CALC: 124 mg/dL — AB (ref 0–99)
TRIGLYCERIDES: 97 mg/dL (ref 0–149)
VLDL CHOLESTEROL CAL: 19 mg/dL (ref 5–40)

## 2017-02-25 MED ORDER — VITAMIN D (ERGOCALCIFEROL) 1.25 MG (50000 UNIT) PO CAPS: 50000.0000 [IU] | ORAL_CAPSULE | ORAL | 0 refills | Status: DC

## 2017-03-02 ENCOUNTER — Other Ambulatory Visit: Payer: Self-pay | Admitting: Family Medicine

## 2017-03-31 ENCOUNTER — Ambulatory Visit: Payer: Managed Care, Other (non HMO) | Admitting: Family Medicine

## 2017-04-14 ENCOUNTER — Ambulatory Visit: Payer: Managed Care, Other (non HMO) | Admitting: Family Medicine

## 2017-05-03 ENCOUNTER — Other Ambulatory Visit: Payer: Self-pay | Admitting: Family Medicine

## 2017-05-24 ENCOUNTER — Other Ambulatory Visit: Payer: Self-pay | Admitting: Family Medicine

## 2017-05-24 MED ORDER — ALBUTEROL SULFATE HFA 108 (90 BASE) MCG/ACT IN AERS: 1.0000 | INHALATION_SPRAY | RESPIRATORY_TRACT | 1 refills | Status: DC | PRN

## 2017-07-04 ENCOUNTER — Other Ambulatory Visit: Payer: Self-pay | Admitting: Family Medicine

## 2017-09-04 ENCOUNTER — Other Ambulatory Visit: Payer: Self-pay | Admitting: Family Medicine

## 2017-09-07 ENCOUNTER — Ambulatory Visit: Payer: Managed Care, Other (non HMO) | Admitting: Family Medicine

## 2017-09-07 ENCOUNTER — Encounter: Payer: Self-pay | Admitting: Family Medicine

## 2017-09-07 VITALS — BP 133/90 | HR 98 | Temp 98.0°F | Wt 204.0 lb

## 2017-09-07 DIAGNOSIS — M25512 Pain in left shoulder: Secondary | ICD-10-CM

## 2017-09-07 DIAGNOSIS — J449 Chronic obstructive pulmonary disease, unspecified: Secondary | ICD-10-CM

## 2017-09-07 MED ORDER — PREDNISONE 10 MG PO TABS: ORAL_TABLET | ORAL | 0 refills | Status: DC

## 2017-09-07 MED ORDER — CYCLOBENZAPRINE HCL 10 MG PO TABS: 10.0000 mg | ORAL_TABLET | Freq: Three times a day (TID) | ORAL | 0 refills | Status: DC | PRN

## 2017-09-07 NOTE — Progress Notes (Signed)
BP 133/90 (BP Location: Left Arm, Patient Position: Sitting, Cuff Size: Normal)   Pulse 98   Temp 98 F (36.7 C) (Oral)   Wt 204 lb (92.5 kg)   SpO2 98%   BMI 33.95 kg/m    Subjective:    Patient ID: Meagan York, female    DOB: 03/15/1956, 61 y.o.   MRN: [id number]  HPI: Meagan York is a 61 y.o. female  Chief Complaint  Patient presents with  . Headache    x's 2 weeks  . Shortness of Breath  . Edema    Legs  . Shoulder Pain    x's 5 days   Left deep posterior shoulder pain x 5 days, dull frontal tension headache, malaise. Has been dealing with a COPD flare for over a week now with significant coughing until her back is sore and SOB. Taking tylenol with no relief. Albuterol only helps very temporarily. Denies fevers, chills, palpitations, CP.    Relevant past medical, surgical, family and social history reviewed and updated as indicated. Interim medical history since our last visit reviewed. Allergies and medications reviewed and updated.  Review of Systems  Constitutional: Positive for fatigue.  HENT: Negative.   Respiratory: Positive for cough, chest tightness, shortness of breath and wheezing.   Cardiovascular: Negative.   Gastrointestinal: Negative.   Musculoskeletal: Positive for arthralgias.  Neurological: Positive for headaches.  Psychiatric/Behavioral: Negative.    Per HPI unless specifically indicated above     Objective:    BP 133/90 (BP Location: Left Arm, Patient Position: Sitting, Cuff Size: Normal)   Pulse 98   Temp 98 F (36.7 C) (Oral)   Wt 204 lb (92.5 kg)   SpO2 98%   BMI 33.95 kg/m   Wt Readings from Last 3 Encounters:  09/07/17 204 lb (92.5 kg)  02/24/17 198 lb 14.4 oz (90.2 kg)  10/15/16 203 lb (92.1 kg)    Physical Exam  Constitutional: She is oriented to person, place, and time. She appears well-developed and well-nourished. No distress.  HENT:  Head: Atraumatic.  Eyes: Conjunctivae are normal. Pupils are equal, round, and  reactive to light. No scleral icterus.  Neck: Normal range of motion. Neck supple.  Cardiovascular: Normal rate and normal heart sounds.  Pulmonary/Chest: Effort normal. No respiratory distress. She has wheezes.  Pt states shoulder pain slightly worsens with deep breaths  Musculoskeletal: Normal range of motion.  Unable to replicate posterior shoulder pain with palpation ROM and strength intact  Neurological: She is alert and oriented to person, place, and time.  Skin: Skin is warm and dry.  Psychiatric: She has a normal mood and affect. Her behavior is normal.  Nursing note and vitals reviewed.     Assessment & Plan:   Problem List Items Addressed This Visit      Respiratory   COPD (chronic obstructive pulmonary disease) ([place]) - Primary    Will treat with prednisone, albuterol inhaler, and cough syrups. F/u if no improvement, may start steroid inhaler if no long term improvement after oral prednisone. CXR offered, pt wanting to try conservative management first. WIll order one if no improvement      Relevant Medications   predniSONE (DELTASONE) 10 MG tablet    Other Visit Diagnoses    Acute pain of left shoulder       Appears pleuritic, suspect related to COPD flare and fatigue from breathing issues. Will treat flare with prednisone and delsym and flexeril prn for soreness  Follow up plan: Return for as scheduled.

## 2017-09-09 NOTE — Assessment & Plan Note (Addendum)
Will treat with prednisone, albuterol inhaler, and cough syrups. F/u if no improvement, may start steroid inhaler if no long term improvement after oral prednisone. CXR offered, pt wanting to try conservative management first. WIll order one if no improvement

## 2017-09-09 NOTE — Patient Instructions (Signed)
Follow up as scheduled.  

## 2017-12-08 ENCOUNTER — Other Ambulatory Visit: Payer: Self-pay | Admitting: Family Medicine

## 2018-04-16 ENCOUNTER — Other Ambulatory Visit: Payer: Self-pay | Admitting: Family Medicine

## 2018-04-17 NOTE — Telephone Encounter (Signed)
Refill request for : Lisinopril 3/28/129  20 mg # 90 0 refill Protonix 12/08/17 0 refill #90 Celebrex  200mg  # 90 12/08/17                                         LOV:  02/24/17 with  Park Liter, DO   Pharmacy: [facility].   [phone]

## 2018-04-20 ENCOUNTER — Ambulatory Visit: Payer: 59 | Admitting: Family Medicine

## 2018-04-20 ENCOUNTER — Encounter: Payer: Self-pay | Admitting: Family Medicine

## 2018-04-20 VITALS — BP 143/97 | HR 85 | Temp 98.1°F | Wt 201.3 lb

## 2018-04-20 DIAGNOSIS — E6609 Other obesity due to excess calories: Secondary | ICD-10-CM

## 2018-04-20 DIAGNOSIS — J449 Chronic obstructive pulmonary disease, unspecified: Secondary | ICD-10-CM | POA: Diagnosis not present

## 2018-04-20 DIAGNOSIS — Z720 Tobacco use: Secondary | ICD-10-CM | POA: Diagnosis not present

## 2018-04-20 DIAGNOSIS — K219 Gastro-esophageal reflux disease without esophagitis: Secondary | ICD-10-CM

## 2018-04-20 DIAGNOSIS — Z6833 Body mass index (BMI) 33.0-33.9, adult: Secondary | ICD-10-CM

## 2018-04-20 DIAGNOSIS — H5213 Myopia, bilateral: Secondary | ICD-10-CM

## 2018-04-20 DIAGNOSIS — Z1231 Encounter for screening mammogram for malignant neoplasm of breast: Secondary | ICD-10-CM

## 2018-04-20 DIAGNOSIS — Z853 Personal history of malignant neoplasm of breast: Secondary | ICD-10-CM

## 2018-04-20 DIAGNOSIS — Z1239 Encounter for other screening for malignant neoplasm of breast: Secondary | ICD-10-CM

## 2018-04-20 DIAGNOSIS — K922 Gastrointestinal hemorrhage, unspecified: Secondary | ICD-10-CM

## 2018-04-20 DIAGNOSIS — I10 Essential (primary) hypertension: Secondary | ICD-10-CM

## 2018-04-20 LAB — UA/M W/RFLX CULTURE, ROUTINE
Bilirubin, UA: NEGATIVE
Glucose, UA: NEGATIVE
KETONES UA: NEGATIVE
LEUKOCYTES UA: NEGATIVE
NITRITE UA: NEGATIVE
Protein, UA: NEGATIVE
SPEC GRAV UA: 1.02 (ref 1.005–1.030)
Urobilinogen, Ur: 0.2 mg/dL (ref 0.2–1.0)
pH, UA: 5.5 (ref 5.0–7.5)

## 2018-04-20 LAB — MICROSCOPIC EXAMINATION: Epithelial Cells (non renal): >10 /hpf — AB (ref 0–10)

## 2018-04-20 LAB — MICROALBUMIN, URINE WAIVED
Creatinine, Urine Waived: 200 mg/dL (ref 10–300)
MICROALB, UR WAIVED: 30 mg/L — AB (ref 0–19)
Microalb/Creat Ratio: <30 mg/g (ref <30)

## 2018-04-20 LAB — BAYER DCA HB A1C WAIVED: HB A1C: 5.3 % (ref <7.0)

## 2018-04-20 MED ORDER — ALBUTEROL SULFATE HFA 108 (90 BASE) MCG/ACT IN AERS: 1.0000 | INHALATION_SPRAY | RESPIRATORY_TRACT | 1 refills | Status: DC | PRN

## 2018-04-20 MED ORDER — AMOXICILLIN-POT CLAVULANATE 875-125 MG PO TABS: 1.0000 | ORAL_TABLET | Freq: Two times a day (BID) | ORAL | 0 refills | Status: DC

## 2018-04-20 MED ORDER — LISINOPRIL 20 MG PO TABS: 20.0000 mg | ORAL_TABLET | Freq: Every day | ORAL | 0 refills | Status: DC

## 2018-04-20 MED ORDER — SUCRALFATE 1 G PO TABS: 1.0000 g | ORAL_TABLET | Freq: Three times a day (TID) | ORAL | 3 refills | Status: DC

## 2018-04-20 MED ORDER — PANTOPRAZOLE SODIUM 40 MG PO TBEC: 40.0000 mg | DELAYED_RELEASE_TABLET | Freq: Two times a day (BID) | ORAL | 0 refills | Status: DC

## 2018-04-20 MED ORDER — CELECOXIB 200 MG PO CAPS: ORAL_CAPSULE | ORAL | 1 refills | Status: DC

## 2018-04-20 NOTE — Patient Instructions (Signed)
[facility]  Address: [address], [place], [place] [address]  Phone: [phone]  

## 2018-04-20 NOTE — Assessment & Plan Note (Signed)
Rectal bleeding last week. Will start carafate and get her into GI. Referral generated today. Checking h pylori. Continue protonix. Call with any concerns.

## 2018-04-20 NOTE — Assessment & Plan Note (Signed)
Encouraged patient to quit smoking. Not interested at this time. Call with any concerns.

## 2018-04-20 NOTE — Assessment & Plan Note (Signed)
Checking labs today. Await results. Call with any concerns.  

## 2018-04-20 NOTE — Assessment & Plan Note (Signed)
Under good control. Continue to monitor. Call with any concerns.  

## 2018-04-20 NOTE — Assessment & Plan Note (Signed)
Did not take her medicine today. Take medicine daily. Recheck next visit. Refills given.

## 2018-04-20 NOTE — Assessment & Plan Note (Signed)
Order for mammogram placed today. Call with any concerns.

## 2018-04-20 NOTE — Progress Notes (Signed)
BP (!) 143/97 (BP Location: Left Arm, Patient Position: Sitting, Cuff Size: Large)   Pulse 85   Temp 98.1 F (36.7 C)   Wt 201 lb 5 oz (91.3 kg)   SpO2 99%   BMI 33.50 kg/m    Subjective:    Patient ID: Meagan York, female    DOB: 11-20-1955, 62 y.o.   MRN: [id number]  HPI: Meagan York is a 62 y.o. female  Chief Complaint  Patient presents with  . Hyperlipidemia  . Gastroesophageal Reflux  . Pain  . Other    Patient states that she would liek to be checked fro cancer, please order her mammogram, as she has had an abnormal one in the past, unsure if a specific one needs to be ordered.    HYPERTENSION- did not take her medicine this AM Hypertension status: stable  Satisfied with current treatment? yes Duration of hypertension: chronic BP monitoring frequency:  not checking BP medication side effects:  no Medication compliance: fair compliance Previous BP meds: lisinopril Aspirin: no Recurrent headaches: yes Visual changes: no Palpitations: no Dyspnea: yes Chest pain: no Lower extremity edema: no Dizzy/lightheaded: yes  UPPER RESPIRATORY TRACT INFECTION Duration: 2 weeks Worst symptom: cough, SOB Fever: no Cough: yes Shortness of breath: yes Wheezing: yes Chest pain: no Chest tightness: no Chest congestion: yes Nasal congestion: yes Runny nose: yes Post nasal drip: yes Sneezing: yes Sore throat: yes Swollen glands: no Sinus pressure: yes Headache: yes Face pain: no Toothache: yes Ear pain: no  Ear pressure: no  Eyes red/itching:yes Eye drainage/crusting: no  Vomiting: no Rash: no Fatigue: yes Sick contacts: no Strep contacts: no  Context: stable Recurrent sinusitis: no Relief with OTC cold/cough medications: no  Treatments attempted: none   GERD- has known ulcers, has been taking her protonix, but has been taking ibuprofen. Didn't realize it was the same as  GERD control status: uncontrolled  Satisfied with current treatment?  no Heartburn frequency: constant Medication side effects: no  Medication compliance: stable Previous GERD medications: protonix Dysphagia: yes Odynophagia:  no Hematemesis: no Blood in stool: yes EGD: no   Relevant past medical, surgical, family and social history reviewed and updated as indicated. Interim medical history since our last visit reviewed. Allergies and medications reviewed and updated.  Review of Systems  Constitutional: Positive for fatigue. Negative for activity change, appetite change, chills, diaphoresis, fever and unexpected weight change.  HENT: Positive for congestion, postnasal drip, rhinorrhea, sinus pressure and sore throat. Negative for dental problem, drooling, ear discharge, ear pain, facial swelling, hearing loss, mouth sores, nosebleeds, sinus pain, sneezing, tinnitus, trouble swallowing and voice change.   Eyes: Negative.   Respiratory: Negative.   Cardiovascular: Negative.   Gastrointestinal: Positive for abdominal pain, anal bleeding, blood in stool and nausea. Negative for abdominal distention, constipation, diarrhea and rectal pain.  Psychiatric/Behavioral: Negative.     Per HPI unless specifically indicated above     Objective:    BP (!) 143/97 (BP Location: Left Arm, Patient Position: Sitting, Cuff Size: Large)   Pulse 85   Temp 98.1 F (36.7 C)   Wt 201 lb 5 oz (91.3 kg)   SpO2 99%   BMI 33.50 kg/m   Wt Readings from Last 3 Encounters:  04/20/18 201 lb 5 oz (91.3 kg)  09/07/17 204 lb (92.5 kg)  02/24/17 198 lb 14.4 oz (90.2 kg)    Physical Exam  Constitutional: She is oriented to person, place, and time. She appears well-developed and well-nourished.  No distress.  HENT:  Head: Normocephalic and atraumatic.  Right Ear: Hearing normal.  Left Ear: Hearing normal.  Nose: Nose normal.  Eyes: Conjunctivae and lids are normal. Right eye exhibits no discharge. Left eye exhibits no discharge. No scleral icterus.  Cardiovascular: Normal  rate, regular rhythm, normal heart sounds and intact distal pulses. Exam reveals no gallop and no friction rub.  No murmur heard. Pulmonary/Chest: Effort normal and breath sounds normal. No stridor. No respiratory distress. She has no wheezes. She has no rales. She exhibits no tenderness.  Musculoskeletal: Normal range of motion.  Neurological: She is alert and oriented to person, place, and time.  Skin: Skin is warm, dry and intact. Capillary refill takes less than 2 seconds. No rash noted. She is not diaphoretic. No erythema. No pallor.  Psychiatric: She has a normal mood and affect. Her speech is normal and behavior is normal. Judgment and thought content normal. Cognition and memory are normal.  Nursing note and vitals reviewed.   Results for orders placed or performed in visit on 02/24/17  Microscopic Examination  Result Value Ref Range   WBC, UA 0-5 0 - 5 /hpf   RBC, UA 0-2 0 - 2 /hpf   Epithelial Cells (non renal) 0-10 0 - 10 /hpf   Bacteria, UA None seen None seen/Few  CBC with Differential/Platelet  Result Value Ref Range   WBC 5.9 3.4 - 10.8 x10E3/uL   RBC 4.02 3.77 - 5.28 x10E6/uL   Hemoglobin 11.6 11.1 - 15.9 g/dL   Hematocrit 35.5 34.0 - 46.6 %   MCV 88 79 - 97 fL   MCH 28.9 26.6 - 33.0 pg   MCHC 32.7 31.5 - 35.7 g/dL   RDW 14.5 12.3 - 15.4 %   Platelets 278 150 - 379 x10E3/uL   Neutrophils 52 Not Estab. %   Lymphs 36 Not Estab. %   Monocytes 8 Not Estab. %   Eos 3 Not Estab. %   Basos 1 Not Estab. %   Neutrophils Absolute 3.1 1.4 - 7.0 x10E3/uL   Lymphocytes Absolute 2.1 0.7 - 3.1 x10E3/uL   Monocytes Absolute 0.5 0.1 - 0.9 x10E3/uL   EOS (ABSOLUTE) 0.2 0.0 - 0.4 x10E3/uL   Basophils Absolute 0.0 0.0 - 0.2 x10E3/uL   Immature Granulocytes 0 Not Estab. %   Immature Grans (Abs) 0.0 0.0 - 0.1 x10E3/uL  Comprehensive metabolic panel  Result Value Ref Range   Glucose 68 65 - 99 mg/dL   BUN 8 6 - 24 mg/dL   Creatinine, Ser 0.64 0.57 - 1.00 mg/dL   GFR calc non Af  Amer 98 >59 mL/min/1.73   GFR calc Af Amer 113 >59 mL/min/1.73   BUN/Creatinine Ratio 13 9 - 23   Sodium 143 134 - 144 mmol/L   Potassium 3.8 3.5 - 5.2 mmol/L   Chloride 102 96 - 106 mmol/L   CO2 26 20 - 29 mmol/L   Calcium 8.8 8.7 - 10.2 mg/dL   Total Protein 6.2 6.0 - 8.5 g/dL   Albumin 3.8 3.5 - 5.5 g/dL   Globulin, Total 2.4 1.5 - 4.5 g/dL   Albumin/Globulin Ratio 1.6 1.2 - 2.2   Bilirubin Total 0.4 0.0 - 1.2 mg/dL   Alkaline Phosphatase 108 39 - 117 IU/L   AST 21 0 - 40 IU/L   ALT 23 0 - 32 IU/L  Lipid Panel w/o Chol/HDL Ratio  Result Value Ref Range   Cholesterol, Total 207 (H) 100 - 199 mg/dL   Triglycerides  97 0 - 149 mg/dL   HDL 64 >39 mg/dL   VLDL Cholesterol Cal 19 5 - 40 mg/dL   LDL Calculated 124 (H) 0 - 99 mg/dL  Microalbumin, Urine Waived  Result Value Ref Range   Microalb, Ur Waived 10 0 - 19 mg/L   Creatinine, Urine Waived 50 10 - 300 mg/dL   Microalb/Creat Ratio 30-300 (H) <30 mg/g  TSH  Result Value Ref Range   TSH 2.820 0.450 - 4.500 uIU/mL  UA/M w/rflx Culture, Routine  Result Value Ref Range   Specific Gravity, UA <1.005 (L) 1.005 - 1.030   pH, UA 6.0 5.0 - 7.5   Color, UA Yellow Yellow   Appearance Ur Clear Clear   Leukocytes, UA Negative Negative   Protein, UA Negative Negative/Trace   Glucose, UA Negative Negative   Ketones, UA Negative Negative   RBC, UA Trace (A) Negative   Bilirubin, UA Negative Negative   Urobilinogen, Ur 0.2 0.2 - 1.0 mg/dL   Nitrite, UA Negative Negative   Microscopic Examination See below:   VITAMIN D 25 Hydroxy (Vit-D Deficiency, Fractures)  Result Value Ref Range   Vit D, 25-Hydroxy 19.8 (L) 30.0 - 100.0 ng/mL  Bayer DCA Hb A1c Waived  Result Value Ref Range   HB A1C (BAYER DCA - WAIVED) 5.3 <7.0 %      Assessment & Plan:   Problem List Items Addressed This Visit      Cardiovascular and Mediastinum   HTN (hypertension) - Primary    Did not take her medicine today. Take medicine daily. Recheck next visit.  Refills given.       Relevant Medications   lisinopril (PRINIVIL,ZESTRIL) 20 MG tablet   Other Relevant Orders   Comprehensive metabolic panel   Microalbumin, Urine Waived   UA/M w/rflx Culture, Routine     Respiratory   COPD (chronic obstructive pulmonary disease) ([facility])    Under good control. Continue to monitor. Call with any concerns.       Relevant Medications   albuterol (PROAIR HFA) 108 (90 Base) MCG/ACT inhaler   Other Relevant Orders   CBC with Differential/Platelet   Comprehensive metabolic panel     Digestive   Upper GI bleed    Rectal bleeding last week. Will start carafate and get her into GI. Referral generated today. Checking h pylori. Continue protonix. Call with any concerns.       Relevant Orders   Ambulatory referral to Gastroenterology   Helicobacter Pylori Antibody, IGM([facility])   GERD (gastroesophageal reflux disease)    Rectal bleeding last week. Will start carafate and get her into GI. Referral generated today. Checking h pylori. Continue protonix. Call with any concerns.       Relevant Medications   pantoprazole (PROTONIX) 40 MG tablet   sucralfate (CARAFATE) 1 g tablet   Other Relevant Orders   CBC with Differential/Platelet   Comprehensive metabolic panel   Ambulatory referral to Gastroenterology   Helicobacter Pylori Antibody, IGM([facility])     Other   History of breast cancer    Order for mammogram placed today. Call with any concerns.       Relevant Orders   MM DIGITAL SCREENING BILATERAL   Tobacco abuse    Encouraged patient to quit smoking. Not interested at this time. Call with any concerns.       Relevant Orders   CBC with Differential/Platelet   Comprehensive metabolic panel   UA/M w/rflx Culture, Routine   Class 1 obesity due to excess  calories with serious comorbidity and body mass index (BMI) of 33.0 to 33.9 in adult    Checking labs today. Await results. Call with any concerns.       Relevant Orders    Bayer DCA Hb A1c Waived   Lipid Panel w/o Chol/HDL Ratio   TSH    Other Visit Diagnoses    Myopia of both eyes       Referral to opthalmology today. Call with any concerns.    Relevant Orders   Ambulatory referral to Ophthalmology   Screening for breast cancer       Mammogram ordered today. Await results.    Relevant Orders   MM DIGITAL SCREENING BILATERAL       Follow up plan: Return in about 4 weeks (around 05/18/2018) for Physical.

## 2018-04-21 ENCOUNTER — Encounter: Payer: Self-pay | Admitting: Family Medicine

## 2018-04-21 LAB — COMPREHENSIVE METABOLIC PANEL
A/G RATIO: 1.4 (ref 1.2–2.2)
ALK PHOS: 98 IU/L (ref 39–117)
ALT: 26 IU/L (ref 0–32)
AST: 22 IU/L (ref 0–40)
Albumin: 3.8 g/dL (ref 3.6–4.8)
BILIRUBIN TOTAL: 0.5 mg/dL (ref 0.0–1.2)
BUN / CREAT RATIO: 8 — AB (ref 12–28)
BUN: 5 mg/dL — AB (ref 8–27)
CHLORIDE: 103 mmol/L (ref 96–106)
CO2: 26 mmol/L (ref 20–29)
Calcium: 8.8 mg/dL (ref 8.7–10.3)
Creatinine, Ser: 0.63 mg/dL (ref 0.57–1.00)
GFR calc Af Amer: 113 mL/min/{1.73_m2} (ref >59)
GFR calc non Af Amer: 98 mL/min/{1.73_m2} (ref >59)
Globulin, Total: 2.7 g/dL (ref 1.5–4.5)
Glucose: 77 mg/dL (ref 65–99)
POTASSIUM: 3.9 mmol/L (ref 3.5–5.2)
Sodium: 142 mmol/L (ref 134–144)
Total Protein: 6.5 g/dL (ref 6.0–8.5)

## 2018-04-21 LAB — CBC WITH DIFFERENTIAL/PLATELET
Basophils Absolute: 0 10*3/uL (ref 0.0–0.2)
Basos: 1 %
EOS (ABSOLUTE): 0.2 10*3/uL (ref 0.0–0.4)
Eos: 3 %
Hematocrit: 39.3 % (ref 34.0–46.6)
Hemoglobin: 12.9 g/dL (ref 11.1–15.9)
Immature Grans (Abs): 0 10*3/uL (ref 0.0–0.1)
Immature Granulocytes: 0 %
LYMPHS ABS: 1.7 10*3/uL (ref 0.7–3.1)
Lymphs: 30 %
MCH: 30.3 pg (ref 26.6–33.0)
MCHC: 32.8 g/dL (ref 31.5–35.7)
MCV: 92 fL (ref 79–97)
MONOS ABS: 0.5 10*3/uL (ref 0.1–0.9)
Monocytes: 9 %
NEUTROS ABS: 3.1 10*3/uL (ref 1.4–7.0)
Neutrophils: 57 %
PLATELETS: 279 10*3/uL (ref 150–450)
RBC: 4.26 x10E6/uL (ref 3.77–5.28)
RDW: 13.5 % (ref 12.3–15.4)
WBC: 5.4 10*3/uL (ref 3.4–10.8)

## 2018-04-21 LAB — LIPID PANEL W/O CHOL/HDL RATIO
CHOLESTEROL TOTAL: 209 mg/dL — AB (ref 100–199)
HDL: 50 mg/dL (ref >39)
LDL Calculated: 136 mg/dL — ABNORMAL HIGH (ref 0–99)
Triglycerides: 117 mg/dL (ref 0–149)
VLDL CHOLESTEROL CAL: 23 mg/dL (ref 5–40)

## 2018-04-21 LAB — TSH: TSH: 2.88 u[IU]/mL (ref 0.450–4.500)

## 2018-04-21 LAB — HELICOBACTER PYLORI  ANTIBODY, IGM: H pylori, IgM Abs: <9 units (ref 0.0–8.9)

## 2018-04-24 ENCOUNTER — Telehealth: Payer: Self-pay | Admitting: Family Medicine

## 2018-04-24 DIAGNOSIS — R197 Diarrhea, unspecified: Secondary | ICD-10-CM

## 2018-04-24 MED ORDER — AZITHROMYCIN 250 MG PO TABS: ORAL_TABLET | ORAL | 0 refills | Status: DC

## 2018-04-24 NOTE — Telephone Encounter (Signed)
Called patient, no answer, mailbox full, will try again.

## 2018-04-24 NOTE — Telephone Encounter (Signed)
Please make sure she's taking it with food- on a full stomach. If she is not better with that, let me know and I'll change her antibiotic.

## 2018-04-24 NOTE — Telephone Encounter (Signed)
Please have her stop it and start z-pack I sent through, if stools not better by Monday, Please have her do stool tests. Orders in.

## 2018-04-24 NOTE — Telephone Encounter (Signed)
Copied from [place] [id number]. Topic: Quick Communication - See Telephone Encounter >> Apr 24, 2018  9:42 AM Reyne Dumas L wrote: CRM for notification. See Telephone encounter for: 04/24/18. Pt calling in:  States that she was given amoxicillin-clavulanate (AUGMENTIN) 875-125 MG tablet by Dr. Wynetta Emery.  Pt states that since taking it it has upset her stomach and it constantly bubbles.  Pt states that her bottom is now raw.  Pt wants to know if she needs to stop taking this medication or if there is anything she can do for these side effects. Pt can be reached at [phone]

## 2018-04-24 NOTE — Telephone Encounter (Signed)
Patient states that she is taking it on a full stomach, she is having watery diarrhea, headache and nausea.   [facility]

## 2018-04-25 ENCOUNTER — Other Ambulatory Visit: Payer: Self-pay | Admitting: Family Medicine

## 2018-04-25 DIAGNOSIS — N6310 Unspecified lump in the right breast, unspecified quadrant: Secondary | ICD-10-CM

## 2018-04-25 NOTE — Telephone Encounter (Signed)
Called and left a detailed message letting patient know what Dr.Johnson said.

## 2018-04-25 NOTE — Telephone Encounter (Signed)
Tried to call patient, number busy, will try again.

## 2018-05-09 ENCOUNTER — Ambulatory Visit
Admission: RE | Admit: 2018-05-09 | Discharge: 2018-05-09 | Disposition: A | Discharge status: Home / Self Care | Payer: 59 | Source: Ambulatory Visit | Attending: Family Medicine | Admitting: Family Medicine

## 2018-05-09 ENCOUNTER — Encounter: Payer: Self-pay | Admitting: Family Medicine

## 2018-05-09 DIAGNOSIS — N6310 Unspecified lump in the right breast, unspecified quadrant: Secondary | ICD-10-CM

## 2018-05-09 HISTORY — DX: Personal history of irradiation: Z92.3

## 2018-05-18 ENCOUNTER — Ambulatory Visit: Payer: 59 | Admitting: Physician Assistant

## 2018-05-18 ENCOUNTER — Encounter: Payer: Self-pay | Admitting: Physician Assistant

## 2018-05-18 VITALS — BP 150/104 | HR 87 | Temp 98.4°F | Wt 203.0 lb

## 2018-05-18 DIAGNOSIS — J441 Chronic obstructive pulmonary disease with (acute) exacerbation: Secondary | ICD-10-CM | POA: Diagnosis not present

## 2018-05-18 MED ORDER — BENZONATATE 100 MG PO CAPS: 100.0000 mg | ORAL_CAPSULE | Freq: Three times a day (TID) | ORAL | 0 refills | Status: AC | PRN

## 2018-05-18 MED ORDER — DOXYCYCLINE HYCLATE 100 MG PO TABS: 100.0000 mg | ORAL_TABLET | Freq: Two times a day (BID) | ORAL | 0 refills | Status: AC

## 2018-05-18 MED ORDER — PREDNISONE 20 MG PO TABS: 40.0000 mg | ORAL_TABLET | Freq: Every day | ORAL | 0 refills | Status: AC

## 2018-05-18 NOTE — Progress Notes (Signed)
Subjective:    Patient ID: Meagan York, female    DOB: [birth year], 62 y.o.   MRN: [id number]  Meagan York is a 61 y.o. female presenting on 05/18/2018 for URI (pt states she has had congestion, headache, runny nose, and wheezing for the past 2 days )   HPI   History of COPD. Reports she has two people in her house that are sick with URI. She has had SOB, having to use her albuterol inhaler. She reports productive cough. Denies fever, chills. Does have some headache and nausea.   Social History   Tobacco Use  . Smoking status: Current Every Day Smoker    Packs/day: 1.00    Years: 40.00    Pack years: 40.00    Types: Cigarettes  . Smokeless tobacco: Never Used  Substance Use Topics  . Alcohol use: No    Alcohol/week: 0.0 standard drinks  . Drug use: No    Review of Systems Per HPI unless specifically indicated above     Objective:    BP (!) 150/104   Pulse 87   Temp 98.4 F (36.9 C) (Oral)   Wt 203 lb (92.1 kg)   SpO2 97%   BMI 33.78 kg/m   Wt Readings from Last 3 Encounters:  05/18/18 203 lb (92.1 kg)  04/20/18 201 lb 5 oz (91.3 kg)  09/07/17 204 lb (92.5 kg)    Physical Exam  Constitutional: She is oriented to person, place, and time. She appears well-developed and well-nourished.  Cardiovascular: Normal rate and regular rhythm.  Pulmonary/Chest: Effort normal. She has wheezes. She has no rales.  Mild scattered wheezing in bilateral lung fields.   Neurological: She is alert and oriented to person, place, and time.  Skin: Skin is warm and dry.  Psychiatric: She has a normal mood and affect. Her behavior is normal.   Results for orders placed or performed in visit on 04/20/18  Microscopic Examination  Result Value Ref Range   WBC, UA 0-5 0 - 5 /hpf   RBC, UA 0-2 0 - 2 /hpf   Epithelial Cells (non renal) >10 (A) 0 - 10 /hpf   Bacteria, UA Few None seen/Few  Bayer DCA Hb A1c Waived  Result Value Ref Range   HB A1C (BAYER DCA - WAIVED) 5.3 <7.0 %  CBC  with Differential/Platelet  Result Value Ref Range   WBC 5.4 3.4 - 10.8 x10E3/uL   RBC 4.26 3.77 - 5.28 x10E6/uL   Hemoglobin 12.9 11.1 - 15.9 g/dL   Hematocrit 39.3 34.0 - 46.6 %   MCV 92 79 - 97 fL   MCH 30.3 26.6 - 33.0 pg   MCHC 32.8 31.5 - 35.7 g/dL   RDW 13.5 12.3 - 15.4 %   Platelets 279 150 - 450 x10E3/uL   Neutrophils 57 Not Estab. %   Lymphs 30 Not Estab. %   Monocytes 9 Not Estab. %   Eos 3 Not Estab. %   Basos 1 Not Estab. %   Neutrophils Absolute 3.1 1.4 - 7.0 x10E3/uL   Lymphocytes Absolute 1.7 0.7 - 3.1 x10E3/uL   Monocytes Absolute 0.5 0.1 - 0.9 x10E3/uL   EOS (ABSOLUTE) 0.2 0.0 - 0.4 x10E3/uL   Basophils Absolute 0.0 0.0 - 0.2 x10E3/uL   Immature Granulocytes 0 Not Estab. %   Immature Grans (Abs) 0.0 0.0 - 0.1 x10E3/uL  Comprehensive metabolic panel  Result Value Ref Range   Glucose 77 65 - 99 mg/dL   BUN 5 (L)  8 - 27 mg/dL   Creatinine, Ser 0.63 0.57 - 1.00 mg/dL   GFR calc non Af Amer 98 >59 mL/min/1.73   GFR calc Af Amer 113 >59 mL/min/1.73   BUN/Creatinine Ratio 8 (L) 12 - 28   Sodium 142 134 - 144 mmol/L   Potassium 3.9 3.5 - 5.2 mmol/L   Chloride 103 96 - 106 mmol/L   CO2 26 20 - 29 mmol/L   Calcium 8.8 8.7 - 10.3 mg/dL   Total Protein 6.5 6.0 - 8.5 g/dL   Albumin 3.8 3.6 - 4.8 g/dL   Globulin, Total 2.7 1.5 - 4.5 g/dL   Albumin/Globulin Ratio 1.4 1.2 - 2.2   Bilirubin Total 0.5 0.0 - 1.2 mg/dL   Alkaline Phosphatase 98 39 - 117 IU/L   AST 22 0 - 40 IU/L   ALT 26 0 - 32 IU/L  Lipid Panel w/o Chol/HDL Ratio  Result Value Ref Range   Cholesterol, Total 209 (H) 100 - 199 mg/dL   Triglycerides 117 0 - 149 mg/dL   HDL 50 >39 mg/dL   VLDL Cholesterol Cal 23 5 - 40 mg/dL   LDL Calculated 136 (H) 0 - 99 mg/dL  Microalbumin, Urine Waived  Result Value Ref Range   Microalb, Ur Waived 30 (H) 0 - 19 mg/L   Creatinine, Urine Waived 200 10 - 300 mg/dL   Microalb/Creat Ratio <30 <30 mg/g  TSH  Result Value Ref Range   TSH 2.880 0.450 - 4.500 uIU/mL    UA/M w/rflx Culture, Routine  Result Value Ref Range   Specific Gravity, UA 1.020 1.005 - 1.030   pH, UA 5.5 5.0 - 7.5   Color, UA Yellow Yellow   Appearance Ur Cloudy (A) Clear   Leukocytes, UA Negative Negative   Protein, UA Negative Negative/Trace   Glucose, UA Negative Negative   Ketones, UA Negative Negative   RBC, UA 2+ (A) Negative   Bilirubin, UA Negative Negative   Urobilinogen, Ur 0.2 0.2 - 1.0 mg/dL   Nitrite, UA Negative Negative   Microscopic Examination See below:   HELICOBACTER PYLORI  ANTIBODY, IGM  Result Value Ref Range   H pylori, IgM Abs <9.0 0.0 - 8.9 units      Assessment & Plan:  1. COPD exacerbation (HCC)  - doxycycline (VIBRA-TABS) 100 MG tablet; Take 1 tablet (100 mg total) by mouth 2 (two) times daily for 7 days.  Dispense: 14 tablet; Refill: 0 - predniSONE (DELTASONE) 20 MG tablet; Take 2 tablets (40 mg total) by mouth daily with breakfast for 5 days.  Dispense: 10 tablet; Refill: 0 - benzonatate (TESSALON PERLES) 100 MG capsule; Take 1 capsule (100 mg total) by mouth 3 (three) times daily as needed for up to 7 days for cough.  Dispense: 21 capsule; Refill: 0   Follow up plan: Return if symptoms worsen or fail to improve.  Carles Collet, PA-C [facility] 05/18/2018, 3:12 PM

## 2018-05-18 NOTE — Patient Instructions (Signed)

## 2018-06-06 ENCOUNTER — Encounter: Payer: Self-pay | Admitting: Gastroenterology

## 2018-06-06 ENCOUNTER — Other Ambulatory Visit: Payer: Self-pay

## 2018-06-06 ENCOUNTER — Encounter: Payer: 59 | Admitting: Family Medicine

## 2018-06-06 ENCOUNTER — Ambulatory Visit (INDEPENDENT_AMBULATORY_CARE_PROVIDER_SITE_OTHER): Payer: 59 | Admitting: Gastroenterology

## 2018-06-06 VITALS — BP 137/88 | HR 95 | Resp 17 | Wt 204.4 lb

## 2018-06-06 DIAGNOSIS — K279 Peptic ulcer, site unspecified, unspecified as acute or chronic, without hemorrhage or perforation: Secondary | ICD-10-CM | POA: Diagnosis not present

## 2018-06-06 DIAGNOSIS — K625 Hemorrhage of anus and rectum: Secondary | ICD-10-CM

## 2018-06-06 DIAGNOSIS — R1013 Epigastric pain: Secondary | ICD-10-CM

## 2018-06-06 DIAGNOSIS — K5909 Other constipation: Secondary | ICD-10-CM

## 2018-06-06 NOTE — Progress Notes (Signed)
Cephas Darby, MD [address]  [place]  [place], [place] [address]  Main: [phone]  Fax: [phone]    Gastroenterology Consultation  Referring Provider:     Valerie Roys, DO Primary Care Physician:  Valerie Roys, DO Primary Gastroenterologist:  Dr. Rayann Heman Reason for Consultation:     Epigastric pain, rectal bleeding        HPI:   Meagan York is a 63 y.o. female referred by Dr. Wynetta Emery, Barb Merino, DO  for consultation & management of chronic epigastric pain and rectal bleeding. Patient has known history of peptic ulcer disease, iron deficiency anemia based on endoscopic evaluation in 2016 by Dr. Rayann Heman. She is referred to GI for further evaluation of rectal bleeding. Patient reports that her rectal bleeding is episodic, particularly when she is constipated. She reports that she has small meals, 3 times daily but drinks several cans of sodas daily. She does not drink water. She thinks her weight gain and obesity is secondary to consumption of carbonated beverages. She used to take NSAIDs in the past, stopped when she found out about gastric ulcers. Her hemoglobin has been normal. She has been started on Protonix 40 mg twice daily along with sucralfate. Patient denies any other GI symptoms  NSAIDs: in the past that led to gastric ulcers  Antiplts/Anticoagulants/Anti thrombotics: none  GI Procedures:  EGD 07/07/15 - Widely patent Schatzki ring. - Gastric ulcers with clean base. - Normal examined duodenum. - No specimens collected. Colonoscopy 07/07/2015 - Non-thrombosed external hemorrhoids found on perianal exam. - Internal hemorrhoids. - The examination was otherwise normal. - No specimens collected. She denies family history of GI malignancy  Past Medical History:  Diagnosis Date  . Arthritis   . Bilateral breast cysts   . 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 Complication of anesthesia    pt  woke up during breast surgery (1995)  . COPD (chronic obstructive pulmonary disease) ([place])   . GERD (gastroesophageal reflux disease)   . Hemorrhoids   . Irritable bowel disease   . 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 APPENDECTOMY    . BREAST BIOPSY Right 01-01-15   BIPHASIC FIBROEPITHELIAL LESION with ADH  . BREAST LUMPECTOMY Right 04/25/2015   Procedure: RIGHT BREAST LUMPECTOMY, exicision of skin tag right face;  Surgeon: Christene Lye, MD;  Location: [facility] ORS;  Service: General;  Laterality: Right;  . BREAST MAMMOSITE Right 05-21-15  . BREAST SURGERY Left    cyst removal  . COLONOSCOPY WITH PROPOFOL N/A 07/07/2015   Procedure: COLONOSCOPY WITH PROPOFOL;  Surgeon: Josefine Class, MD;  Location: [facility] ENDOSCOPY;  Service: Endoscopy;  Laterality: N/A;  . cyst lower spine    . ESOPHAGOGASTRODUODENOSCOPY (EGD) WITH PROPOFOL N/A 07/07/2015   Procedure: ESOPHAGOGASTRODUODENOSCOPY (EGD) WITH PROPOFOL;  Surgeon: Josefine Class, MD;  Location: [facility] ENDOSCOPY;  Service: Endoscopy;  Laterality: N/A;  . KNEE SURGERY    . piondial cyst excision  03/1989    Current Outpatient Medications:  .  albuterol (PROAIR HFA) 108 (90 Base) MCG/ACT inhaler, Inhale 1-2 puffs into the lungs every 4 (four) hours as needed for wheezing or shortness of breath., Disp: 18 g, Rfl: 1 .  celecoxib (CELEBREX) 200 MG capsule, TAKE 1 CAPSULE BY MOUTH EVERY DAY, Disp: 90 capsule, Rfl: 1 .  Cholecalciferol (VITAMIN D3) 1000 units  CAPS, Take 1,000 Units by mouth daily., Disp: , Rfl:  .  lisinopril (PRINIVIL,ZESTRIL) 20 MG tablet, Take 1 tablet (20 mg total) by mouth daily., Disp: 90 tablet, Rfl: 0 .  pantoprazole (PROTONIX) 40 MG tablet, Take 1 tablet (40 mg total) by mouth 2 (two) times daily., Disp: 180 tablet, Rfl: 0 .  sucralfate (CARAFATE) 1 g tablet, Take 1 tablet (1 g total) by mouth 4 (four) times daily -  with meals and at  bedtime., Disp: 90 tablet, Rfl: 3 .  predniSONE (DELTASONE) 10 MG tablet, TAKE 4 TABLETS (40 MG TOTAL) BY MOUTH DAILY WITH BREAKFAST FOR 5 DAYS., Disp: , Rfl: 0   Family History  Problem Relation Age of Onset  . Colon cancer Father        age 64  . Anemia Father   . Diabetes Father   . Hypertension Father   . Diverticulosis Mother   . COPD Mother   . Hypertension Brother   . Alzheimer's disease Paternal Grandfather      Social History   Tobacco Use  . Smoking status: Current Every Day Smoker    Packs/day: 1.00    Years: 40.00    Pack years: 40.00    Types: Cigarettes  . Smokeless tobacco: Never Used  Substance Use Topics  . Alcohol use: No    Alcohol/week: 0.0 standard drinks  . Drug use: No    Allergies as of 06/06/2018 - Review Complete 06/06/2018  Allergen Reaction Noted  . Aspirin  10/07/2016  . Tramadol Nausea And Vomiting 12/30/2014    Review of Systems:    All systems reviewed and negative except where noted in HPI.   Physical Exam:  BP 137/88 (BP Location: Left Arm, Patient Position: Sitting, Cuff Size: Large)   Pulse 95   Resp 17   Wt 204 lb 6.4 oz (92.7 kg)   BMI 34.01 kg/m  No LMP recorded. Patient is postmenopausal.  General:   Alert,  Well-developed, well-nourished, pleasant and cooperative in NAD Head:  Normocephalic and atraumatic. Eyes:  Sclera clear, no icterus.   Conjunctiva pink. Ears:  Normal auditory acuity. Nose:  No deformity, discharge, or lesions. Mouth:  No deformity or lesions,oropharynx pink & moist. Neck:  Supple; no masses or thyromegaly. Lungs:  Respirations even and unlabored.  Clear throughout to auscultation.   No wheezes, crackles, or rhonchi. No acute distress. Heart:  Regular rate and rhythm; no murmurs, clicks, rubs, or gallops. Abdomen:  Normal bowel sounds. Soft, epigastric tenderness and non-distended without masses, hepatosplenomegaly or hernias noted.  No guarding or rebound tenderness.   Rectal: Not  performed Msk:  Symmetrical without gross deformities. Good, equal movement & strength bilaterally. Pulses:  Normal pulses noted. Extremities:  No clubbing or edema.  No cyanosis. Neurologic:  Alert and oriented x3;  grossly normal neurologically. Skin:  Intact without significant lesions or rashes. No jaundice. Lymph Nodes:  No significant cervical adenopathy. Psych:  Alert and cooperative. Normal mood and affect.  Imaging Studies: reviewed  Assessment and Plan:   Meagan York is a 62 y.o. Caucasian female with obesity, BMI 34, history of peptic ulcer disease, history of NSAID use is seen in consultation for rectal bleeding  Rectal bleeding: Most likely hemorrhoidal, painless Recommend colonoscopy for further evaluation Discussed with her about outpatient hemorrhoid ligation after the colonoscopy  Chronic constipation: Discontinue carbonated beverages Discussed with her about high-fiber diet, high-fiber supplements, education material provided  History of gastric ulcers and epigastric pain: Recommend EGD with  biopsies for further evaluation And continue Protonix 40 mg twice daily   Follow up in 2-3 weeks after the EGD and colonoscopy   Cephas Darby, MD

## 2018-06-06 NOTE — Patient Instructions (Signed)
High-Fiber Diet  Fiber, also called dietary fiber, is a type of carbohydrate found in fruits, vegetables, whole grains, and beans. A high-fiber diet can have many health benefits. Your health care provider may recommend a high-fiber diet to help:  · Prevent constipation. Fiber can make your bowel movements more regular.  · Lower your cholesterol.  · Relieve hemorrhoids, uncomplicated diverticulosis, or irritable bowel syndrome.  · Prevent overeating as part of a weight-loss plan.  · Prevent heart disease, type 2 diabetes, and certain cancers.    What is my plan?  The recommended daily intake of fiber includes:  · 38 grams for men under age 50.  · 30 grams for men over age 50.  · 25 grams for women under age 50.  · 21 grams for women over age 50.    You can get the recommended daily intake of dietary fiber by eating a variety of fruits, vegetables, grains, and beans. Your health care provider may also recommend a fiber supplement if it is not possible to get enough fiber through your diet.  What do I need to know about a high-fiber diet?  · Fiber supplements have not been widely studied for their effectiveness, so it is better to get fiber through food sources.  · Always check the fiber content on the nutrition facts label of any prepackaged food. Look for foods that contain at least 5 grams of fiber per serving.  · Ask your dietitian if you have questions about specific foods that are related to your condition, especially if those foods are not listed in the following section.  · Increase your daily fiber consumption gradually. Increasing your intake of dietary fiber too quickly may cause bloating, cramping, or gas.  · Drink plenty of water. Water helps you to digest fiber.  What foods can I eat?  Grains  Whole-grain breads. Multigrain cereal. Oats and oatmeal. Brown rice. Barley. Bulgur wheat. Millet. Bran muffins. Popcorn. Rye wafer crackers.  Vegetables   Sweet potatoes. Spinach. Kale. Artichokes. Cabbage. Broccoli. Green peas. Carrots. Squash.  Fruits  Berries. Pears. Apples. Oranges. Avocados. Prunes and raisins. Dried figs.  Meats and Other Protein Sources  Navy, kidney, pinto, and soy beans. Split peas. Lentils. Nuts and seeds.  Dairy  Fiber-fortified yogurt.  Beverages  Fiber-fortified soy milk. Fiber-fortified orange juice.  Other  Fiber bars.  The items listed above may not be a complete list of recommended foods or beverages. Contact your dietitian for more options.  What foods are not recommended?  Grains  White bread. Pasta made with refined flour. White rice.  Vegetables  Fried potatoes. Canned vegetables. Well-cooked vegetables.  Fruits  Fruit juice. Cooked, strained fruit.  Meats and Other Protein Sources  Fatty cuts of meat. Fried poultry or fried fish.  Dairy  Milk. Yogurt. Cream cheese. Sour cream.  Beverages  Soft drinks.  Other  Cakes and pastries. Butter and oils.  The items listed above may not be a complete list of foods and beverages to avoid. Contact your dietitian for more information.  What are some tips for including high-fiber foods in my diet?  · Eat a wide variety of high-fiber foods.  · Make sure that half of all grains consumed each day are whole grains.  · Replace breads and cereals made from refined flour or white flour with whole-grain breads and cereals.  · Replace white rice with brown rice, bulgur wheat, or millet.  · Start the day with a breakfast that is high in fiber,   such as a cereal that contains at least 5 grams of fiber per serving.  · Use beans in place of meat in soups, salads, or pasta.  · Eat high-fiber snacks, such as berries, raw vegetables, nuts, or popcorn.  This information is not intended to replace advice given to you by your health care provider. Make sure you discuss any questions you have with your health care provider.  Document Released: 08/30/2005 Document Revised: 02/05/2016 Document Reviewed: 02/12/2014   Elsevier Interactive Patient Education © 2018 Elsevier Inc[employer]

## 2018-06-12 ENCOUNTER — Ambulatory Visit: Payer: 59 | Admitting: Anesthesiology

## 2018-06-12 ENCOUNTER — Encounter: Payer: 59 | Admitting: Family Medicine

## 2018-06-12 ENCOUNTER — Encounter: Payer: Self-pay | Admitting: Gastroenterology

## 2018-06-12 ENCOUNTER — Ambulatory Visit
Admission: RE | Admit: 2018-06-12 | Discharge: 2018-06-12 | Disposition: A | Discharge status: Home / Self Care | Payer: 59 | Source: Ambulatory Visit | Attending: Gastroenterology | Admitting: Gastroenterology

## 2018-06-12 ENCOUNTER — Encounter
Admission: RE | Disposition: A | Discharge status: Home / Self Care | Payer: Self-pay | Source: Ambulatory Visit | Attending: Gastroenterology

## 2018-06-12 DIAGNOSIS — Z833 Family history of diabetes mellitus: Secondary | ICD-10-CM | POA: Diagnosis not present

## 2018-06-12 DIAGNOSIS — K644 Residual hemorrhoidal skin tags: Secondary | ICD-10-CM | POA: Insufficient documentation

## 2018-06-12 DIAGNOSIS — Z853 Personal history of malignant neoplasm of breast: Secondary | ICD-10-CM | POA: Diagnosis not present

## 2018-06-12 DIAGNOSIS — Z800 Family history of malignant neoplasm of digestive organs: Secondary | ICD-10-CM | POA: Diagnosis not present

## 2018-06-12 DIAGNOSIS — Z923 Personal history of irradiation: Secondary | ICD-10-CM | POA: Diagnosis not present

## 2018-06-12 DIAGNOSIS — K449 Diaphragmatic hernia without obstruction or gangrene: Secondary | ICD-10-CM | POA: Diagnosis not present

## 2018-06-12 DIAGNOSIS — Z7982 Long term (current) use of aspirin: Secondary | ICD-10-CM | POA: Diagnosis not present

## 2018-06-12 DIAGNOSIS — K219 Gastro-esophageal reflux disease without esophagitis: Secondary | ICD-10-CM | POA: Insufficient documentation

## 2018-06-12 DIAGNOSIS — D124 Benign neoplasm of descending colon: Secondary | ICD-10-CM | POA: Insufficient documentation

## 2018-06-12 DIAGNOSIS — K2950 Unspecified chronic gastritis without bleeding: Secondary | ICD-10-CM | POA: Diagnosis not present

## 2018-06-12 DIAGNOSIS — Z888 Allergy status to other drugs, medicaments and biological substances status: Secondary | ICD-10-CM | POA: Insufficient documentation

## 2018-06-12 DIAGNOSIS — K625 Hemorrhage of anus and rectum: Secondary | ICD-10-CM | POA: Diagnosis present

## 2018-06-12 DIAGNOSIS — Z820 Family history of epilepsy and other diseases of the nervous system: Secondary | ICD-10-CM | POA: Diagnosis not present

## 2018-06-12 DIAGNOSIS — K279 Peptic ulcer, site unspecified, unspecified as acute or chronic, without hemorrhage or perforation: Secondary | ICD-10-CM | POA: Insufficient documentation

## 2018-06-12 DIAGNOSIS — M1990 Unspecified osteoarthritis, unspecified site: Secondary | ICD-10-CM | POA: Insufficient documentation

## 2018-06-12 DIAGNOSIS — K589 Irritable bowel syndrome without diarrhea: Secondary | ICD-10-CM | POA: Insufficient documentation

## 2018-06-12 DIAGNOSIS — J449 Chronic obstructive pulmonary disease, unspecified: Secondary | ICD-10-CM | POA: Insufficient documentation

## 2018-06-12 DIAGNOSIS — Z825 Family history of asthma and other chronic lower respiratory diseases: Secondary | ICD-10-CM | POA: Diagnosis not present

## 2018-06-12 DIAGNOSIS — Z832 Family history of diseases of the blood and blood-forming organs and certain disorders involving the immune mechanism: Secondary | ICD-10-CM | POA: Insufficient documentation

## 2018-06-12 DIAGNOSIS — F17210 Nicotine dependence, cigarettes, uncomplicated: Secondary | ICD-10-CM | POA: Diagnosis not present

## 2018-06-12 DIAGNOSIS — Z886 Allergy status to analgesic agent status: Secondary | ICD-10-CM | POA: Diagnosis not present

## 2018-06-12 DIAGNOSIS — Z79899 Other long term (current) drug therapy: Secondary | ICD-10-CM | POA: Diagnosis not present

## 2018-06-12 DIAGNOSIS — R1013 Epigastric pain: Secondary | ICD-10-CM

## 2018-06-12 DIAGNOSIS — Z7951 Long term (current) use of inhaled steroids: Secondary | ICD-10-CM | POA: Diagnosis not present

## 2018-06-12 HISTORY — PX: ESOPHAGOGASTRODUODENOSCOPY (EGD) WITH PROPOFOL: SHX5813

## 2018-06-12 HISTORY — PX: COLONOSCOPY WITH PROPOFOL: SHX5780

## 2018-06-12 SURGERY — ESOPHAGOGASTRODUODENOSCOPY (EGD) WITH PROPOFOL
Anesthesia: General

## 2018-06-12 MED ORDER — SODIUM CHLORIDE 0.9 % IV SOLN
INTRAVENOUS | Status: DC
  Administered 2018-06-12: 1000 mL via INTRAVENOUS

## 2018-06-12 MED ORDER — LIDOCAINE HCL (PF) 1 % IJ SOLN
INTRAMUSCULAR | Status: AC
  Administered 2018-06-12: 0.3 mL via INTRADERMAL
  Filled 2018-06-12: qty 2

## 2018-06-12 MED ORDER — IPRATROPIUM-ALBUTEROL 0.5-2.5 (3) MG/3ML IN SOLN
3.0000 mL | Freq: Four times a day (QID) | RESPIRATORY_TRACT | Status: DC
  Administered 2018-06-12: 3 mL via RESPIRATORY_TRACT

## 2018-06-12 MED ORDER — IPRATROPIUM-ALBUTEROL 0.5-2.5 (3) MG/3ML IN SOLN
RESPIRATORY_TRACT | Status: AC
  Administered 2018-06-12: 3 mL via RESPIRATORY_TRACT
  Filled 2018-06-12: qty 3

## 2018-06-12 MED ORDER — LIDOCAINE HCL (PF) 1 % IJ SOLN
2.0000 mL | Freq: Once | INTRAMUSCULAR | Status: AC
  Administered 2018-06-12: 0.3 mL via INTRADERMAL

## 2018-06-12 MED ORDER — PROPOFOL 500 MG/50ML IV EMUL
INTRAVENOUS | Status: DC | PRN
  Administered 2018-06-12: 150 ug/kg/min via INTRAVENOUS

## 2018-06-12 MED ORDER — LIDOCAINE HCL (PF) 2 % IJ SOLN
INTRAMUSCULAR | Status: DC | PRN
  Administered 2018-06-12: 100 mg via INTRADERMAL

## 2018-06-12 MED ORDER — PROPOFOL 10 MG/ML IV BOLUS
INTRAVENOUS | Status: DC | PRN
  Administered 2018-06-12: 40 mg via INTRAVENOUS
  Administered 2018-06-12: 60 mg via INTRAVENOUS

## 2018-06-12 NOTE — H&P (Signed)
Cephas Darby, MD [address]  [place]  [place], [place] [address]  Main: [phone]  Fax: [phone] Pager: [phone]  Primary Care Physician:  Valerie Roys, DO Primary Gastroenterologist:  Dr. Cephas Darby  Pre-Procedure History & Physical: HPI:  Meagan York is a 61 y.o. female is here for an endoscopy and colonoscopy.   Past Medical History:  Diagnosis Date  . Arthritis   . Bilateral breast cysts   . 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 Complication of anesthesia    pt woke up during breast surgery (1995)  . COPD (chronic obstructive pulmonary disease) ([place])   . GERD (gastroesophageal reflux disease)   . Hemorrhoids   . Irritable bowel disease   . 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 APPENDECTOMY    . BREAST BIOPSY Right 01-01-15   BIPHASIC FIBROEPITHELIAL LESION with ADH  . BREAST LUMPECTOMY Right 04/25/2015   Procedure: RIGHT BREAST LUMPECTOMY, exicision of skin tag right face;  Surgeon: Christene Lye, MD;  Location: [facility] ORS;  Service: General;  Laterality: Right;  . BREAST MAMMOSITE Right 05-21-15  . BREAST SURGERY Left    cyst removal  . COLONOSCOPY WITH PROPOFOL N/A 07/07/2015   Procedure: COLONOSCOPY WITH PROPOFOL;  Surgeon: Josefine Class, MD;  Location[facility] ENDOSCOPY;  Service: Endoscopy;  Laterality: N/A;  . cyst lower spine    . ESOPHAGOGASTRODUODENOSCOPY (EGD) WITH PROPOFOL N/A 07/07/2015   Procedure: ESOPHAGOGASTRODUODENOSCOPY (EGD) WITH PROPOFOL;  Surgeon: Josefine Class, MD;  Location: [facility] ENDOSCOPY;  Service: Endoscopy;  Laterality: N/A;  . KNEE SURGERY    . piondial cyst excision  03/1989    Prior to Admission medications   Medication Sig Start Date End Date Taking? Authorizing Provider  albuterol (PROAIR HFA) 108 (90 Base) MCG/ACT inhaler Inhale 1-2 puffs into the lungs every  4 (four) hours as needed for wheezing or shortness of breath. 04/20/18   Johnson, Megan P, DO  celecoxib (CELEBREX) 200 MG capsule TAKE 1 CAPSULE BY MOUTH EVERY DAY 04/20/18   Park Liter P, DO  Cholecalciferol (VITAMIN D3) 1000 units CAPS Take 1,000 Units by mouth daily.    [provider]  lisinopril (PRINIVIL,ZESTRIL) 20 MG tablet Take 1 tablet (20 mg total) by mouth daily. 04/20/18   Johnson, Megan P, DO  pantoprazole (PROTONIX) 40 MG tablet Take 1 tablet (40 mg total) by mouth 2 (two) times daily. 04/20/18   Johnson, Megan P, DO  predniSONE (DELTASONE) 10 MG tablet TAKE 4 TABLETS (40 MG TOTAL) BY MOUTH DAILY WITH BREAKFAST FOR 5 DAYS. 05/18/18   [provider]  sucralfate (CARAFATE) 1 g tablet Take 1 tablet (1 g total) by mouth 4 (four) times daily -  with meals and at bedtime. 04/20/18   Park Liter P, DO    Allergies as of 06/06/2018 - Review Complete 06/06/2018  Allergen Reaction Noted  . Aspirin  10/07/2016  . Tramadol Nausea And Vomiting 12/30/2014    Family History  Problem Relation Age of Onset  . Colon cancer Father        age 95  . Anemia Father   . Diabetes Father   . Hypertension Father   . Diverticulosis Mother   . COPD Mother   . Hypertension Brother   . Alzheimer's disease Paternal Grandfather     Social History  Socioeconomic History  . Marital status: Single    Spouse name: Not on file  . Number of children: Not on file  . Years of education: Not on file  . Highest education level: Not on file  Occupational History  . Not on file  Social Needs  . Financial resource strain: Not on file  . Food insecurity:    Worry: Not on file    Inability: Not on file  . Transportation needs:    Medical: Not on file    Non-medical: Not on file  Tobacco Use  . Smoking status: Current Every Day Smoker    Packs/day: 1.00    Years: 40.00    Pack years: 40.00    Types: Cigarettes  . Smokeless tobacco: Never Used  Substance and Sexual Activity  .  Alcohol use: No    Alcohol/week: 0.0 standard drinks  . Drug use: No  . Sexual activity: Never  Lifestyle  . Physical activity:    Days per week: Not on file    Minutes per session: Not on file  . Stress: Not on file  Relationships  . Social connections:    Talks on phone: Not on file    Gets together: Not on file    Attends religious service: Not on file    Active member of club or organization: Not on file    Attends meetings of clubs or organizations: Not on file    Relationship status: Not on file  . Intimate partner violence:    Fear of current or ex partner: Not on file    Emotionally abused: Not on file    Physically abused: Not on file    Forced sexual activity: Not on file  Other Topics Concern  . Not on file  Social History Narrative  . Not on file    Review of Systems: See HPI, otherwise negative ROS  Physical Exam: BP (!) 149/110   Pulse 73   Temp (!) 96.9 F (36.1 C) (Tympanic)   Resp 17   Ht 5\' 5"  (1.651 m)   Wt 90.7 kg   SpO2 98%   BMI 33.28 kg/m  General:   Alert,  pleasant and cooperative in NAD Head:  Normocephalic and atraumatic. Neck:  Supple; no masses or thyromegaly. Lungs:  Clear throughout to auscultation.    Heart:  Regular rate and rhythm. Abdomen:  Soft, nontender and nondistended. Normal bowel sounds, without guarding, and without rebound.   Neurologic:  Alert and  oriented x4;  grossly normal neurologically.  Impression/Plan: Meagan York is here for an endoscopy and colonoscopy to be performed for PUD and rectal bleeding  Risks, benefits, limitations, and alternatives regarding  endoscopy and colonoscopy have been reviewed with the patient.  Questions have been answered.  All parties agreeable.   Sherri Sear, MD  06/12/2018, 9:19 AM

## 2018-06-12 NOTE — Anesthesia Post-op Follow-up Note (Deleted)
Anesthesia QCDR form completed.        

## 2018-06-12 NOTE — Anesthesia Preprocedure Evaluation (Addendum)
Anesthesia Evaluation  Patient identified by MRN, date of birth, ID band Patient awake    Reviewed: Allergy & Precautions, H&P , NPO status , Patient's Chart, lab work & pertinent test results  History of Anesthesia Complications (+) AWARENESS UNDER ANESTHESIA and history of anesthetic complications  Airway Mallampati: III  TM Distance: >3 FB    Comment: TM distance ~3 FB. Large neck, submental fat Dental  (+) Chipped, Missing   Pulmonary neg pulmonary ROS, COPD,  COPD inhaler, Current Smoker,     + decreased breath sounds+ wheezing      Cardiovascular hypertension, negative cardio ROS   Rhythm:regular Rate:Normal     Neuro/Psych negative neurological ROS  negative psych ROS   GI/Hepatic negative GI ROS, Neg liver ROS, GERD  ,  Endo/Other  negative endocrine ROS  Renal/GU negative Renal ROS  negative genitourinary   Musculoskeletal  (+) Arthritis ,   Abdominal   Peds  Hematology negative hematology ROS (+)   Anesthesia Other Findings Past Medical History: No date: Arthritis No date: Bilateral breast cysts 04-25-15: Cancer ([facility])     Comment:  BENIGN PHYLLODES TUMOR, 7.8 CM, WITH EXTENSIVE DUCTAL               CARCINOMA IN  No date: Complication of anesthesia     Comment:  pt woke up during breast surgery (1995) No date: COPD (chronic obstructive pulmonary disease) ([facility]) No date: GERD (gastroesophageal reflux disease) No date: Hemorrhoids No date: Irritable bowel disease 2016: Personal history of radiation therapy     Comment:  mammosite  Past Surgical History: No date: APPENDECTOMY 01-01-15: BREAST BIOPSY; Right     Comment:  BIPHASIC FIBROEPITHELIAL LESION with ADH 04/25/2015: BREAST LUMPECTOMY; Right     Comment:  Procedure: RIGHT BREAST LUMPECTOMY, exicision of skin               tag right face;  Surgeon: Christene Lye, MD;                Location: [facility] ORS;  Service: General;  Laterality:             Right; 05-21-15: BREAST MAMMOSITE; Right No date: BREAST SURGERY; Left     Comment:  cyst removal 07/07/2015: COLONOSCOPY WITH PROPOFOL; N/A     Comment:  Procedure: COLONOSCOPY WITH PROPOFOL;  Surgeon: Josefine Class, MD;  Location: [facility] ENDOSCOPY;  Service:               Endoscopy;  Laterality: N/A; No date: cyst lower spine 07/07/2015: ESOPHAGOGASTRODUODENOSCOPY (EGD) WITH PROPOFOL; N/A     Comment:  Procedure: ESOPHAGOGASTRODUODENOSCOPY (EGD) WITH               PROPOFOL;  Surgeon: Josefine Class, MD;  Location:               [facility] ENDOSCOPY;  Service: Endoscopy;  Laterality: N/A; No date: KNEE SURGERY 03/1989: piondial cyst excision  BMI    Body Mass Index:  33.28 kg/m      Reproductive/Obstetrics negative OB ROS                            Anesthesia Physical Anesthesia Plan  ASA: III  Anesthesia Plan: General   Post-op Pain Management:    Induction:   PONV Risk Score and Plan: Propofol infusion and TIVA  Airway Management Planned:  Additional Equipment:   Intra-op Plan:   Post-operative Plan:   Informed Consent: I have reviewed the patients History and Physical, chart, labs and discussed the procedure including the risks, benefits and alternatives for the proposed anesthesia with the patient or authorized representative who has indicated his/her understanding and acceptance.   Dental Advisory Given  Plan Discussed with: Anesthesiologist, CRNA and Surgeon  Anesthesia Plan Comments: (Plan albuterol treatment prior to procedure due to wheezing.  Pt states her breathing feels at baseline this morning.)       Anesthesia Quick Evaluation

## 2018-06-12 NOTE — Op Note (Signed)
[facility] Gastroenterology Patient Name: Meagan York Procedure Date: 06/12/2018 10:23 AM MRN: [id number] Account #: [id number] Date of Birth: August 05, 1956 Admit Type: Outpatient Age: 62 Room: [facility] ENDO ROOM 4 Gender: Female Note Status: Finalized Procedure:            Colonoscopy Indications:          Last colonoscopy: October 2016, Rectal bleeding Providers:            Lin Landsman MD, MD Referring MD:         Asencion Partridge (Referring MD), Valerie Roys                        (Referring MD) Medicines:            Monitored Anesthesia Care Complications:        No immediate complications. Estimated blood loss: None. Procedure:            Pre-Anesthesia Assessment:                       - Prior to the procedure, a History and Physical was                        performed, and patient medications and allergies were                        reviewed. The patient is competent. The risks and                        benefits of the procedure and the sedation options and                        risks were discussed with the patient. All questions                        were answered and informed consent was obtained.                        Patient identification and proposed procedure were                        verified by the physician, the nurse, the                        anesthesiologist, the anesthetist and the technician in                        the pre-procedure area in the procedure room in the                        endoscopy suite. Mental Status Examination: alert and                        oriented. Airway Examination: normal oropharyngeal                        airway and neck mobility. Respiratory Examination:                        clear to auscultation. CV Examination: normal.  Prophylactic Antibiotics: The patient does not require                        prophylactic antibiotics. Prior Anticoagulants: The   patient has taken no previous anticoagulant or                        antiplatelet agents. ASA Grade Assessment: III - A                        patient with severe systemic disease. After reviewing                        the risks and benefits, the patient was deemed in                        satisfactory condition to undergo the procedure. The                        anesthesia plan was to use monitored anesthesia care                        (MAC). Immediately prior to administration of                        medications, the patient was re-assessed for adequacy                        to receive sedatives. The heart rate, respiratory rate,                        oxygen saturations, blood pressure, adequacy of                        pulmonary ventilation, and response to care were                        monitored throughout the procedure. The physical status                        of the patient was re-assessed after the procedure.                       After obtaining informed consent, the colonoscope was                        passed under direct vision. Throughout the procedure,                        the patient's blood pressure, pulse, and oxygen                        saturations were monitored continuously. The                        Colonoscope was introduced through the anus and                        advanced to the the cecum, identified by appendiceal  orifice and ileocecal valve. The colonoscopy was                        extremely difficult due to the patient's body habitus.                        Successful completion of the procedure was aided by                        changing the patient to a supine position, changing the                        patient to a prone position and applying abdominal                        pressure. The patient tolerated the procedure fairly                        well. The quality of the bowel preparation was                         evaluated using the BBPS [facility] Bowel Preparation                        Scale) with scores of: Right Colon = 3, Transverse                        Colon = 3 and Left Colon = 3 (entire mucosa seen well                        with no residual staining, small fragments of stool or                        opaque liquid). The total BBPS score equals 9. Findings:      Skin tags were found on perianal exam.      A 7 mm polyp was found in the descending colon. The polyp was sessile.       The polyp was removed with a hot snare. Resection and retrieval were       complete.      External hemorrhoids were found during perianal exam. The hemorrhoids       were large.      The retroflexed view of the distal rectum and anal verge was normal and       showed no anal or rectal abnormalities. Impression:           - Perianal skin tags found on perianal exam.                       - One 7 mm polyp in the descending colon, removed with                        a hot snare. Resected and retrieved.                       - Prolapsed, grade III External hemorrhoids.                       - The distal rectum and anal  verge are normal on                        retroflexion view. Recommendation:       - Discharge patient to home (with escort).                       - Resume previous diet today.                       - Continue present medications.                       - Await pathology results.                       - Repeat colonoscopy in 5 years for surveillance.                       - Refer to a surgeon at appointment to be scheduled for                        hemorrhoid surgery.                       - Return to my office as previously scheduled. Procedure Code(s):    --- Professional ---                       [phone], Colonoscopy, flexible; with removal of tumor(s),                        polyp(s), or other lesion(s) by snare technique Diagnosis Code(s):    --- Professional ---                        D12.3, Benign neoplasm of transverse colon (hepatic                        flexure or splenic flexure)                       K64.4, Residual hemorrhoidal skin tags                       K62.5, Hemorrhage of anus and rectum CPT copyright 2017 American [employer] Association. All rights reserved. The codes documented in this report are preliminary and upon coder review may  be revised to meet current compliance requirements. Dr. Ulyess Mort Lin Landsman MD, MD 06/12/2018 11:30:38 AM This report has been signed electronically. Number of Addenda: 0 Note Initiated On: 06/12/2018 10:23 AM Scope Withdrawal Time: 0 hours 10 minutes 29 seconds  Total Procedure Duration: 0 hours 25 minutes 30 seconds       [facility]

## 2018-06-12 NOTE — Op Note (Signed)
[facility] Gastroenterology Patient Name: Meagan York Procedure Date: 06/12/2018 10:31 AM MRN: [id number] Account #: [id number] Date of Birth: [birth year] Admit Type: Outpatient Age: 62 Room: [facility] ENDO ROOM 4 Gender: Female Note Status: Finalized Procedure:            Upper GI endoscopy Indications:          Dyspepsia, Follow-up of peptic ulcer Providers:            Lin Landsman MD, MD Referring MD:         Asencion Partridge (Referring MD), Valerie Roys                        (Referring MD) Medicines:            Monitored Anesthesia Care Complications:        No immediate complications. Estimated blood loss: None. Procedure:            Pre-Anesthesia Assessment:                       - Prior to the procedure, a History and Physical was                        performed, and patient medications and allergies were                        reviewed. The patient is competent. The risks and                        benefits of the procedure and the sedation options and                        risks were discussed with the patient. All questions                        were answered and informed consent was obtained.                        Patient identification and proposed procedure were                        verified by the physician, the nurse, the                        anesthesiologist, the anesthetist and the technician in                        the pre-procedure area in the procedure room in the                        endoscopy suite. Mental Status Examination: alert and                        oriented. Airway Examination: normal oropharyngeal                        airway and neck mobility. Respiratory Examination:                        clear to auscultation. CV Examination: normal.  Prophylactic Antibiotics: The patient does not require                        prophylactic antibiotics. Prior Anticoagulants: The   patient has taken no previous anticoagulant or                        antiplatelet agents. ASA Grade Assessment: III - A                        patient with severe systemic disease. After reviewing                        the risks and benefits, the patient was deemed in                        satisfactory condition to undergo the procedure. The                        anesthesia plan was to use monitored anesthesia care                        (MAC). Immediately prior to administration of                        medications, the patient was re-assessed for adequacy                        to receive sedatives. The heart rate, respiratory rate,                        oxygen saturations, blood pressure, adequacy of                        pulmonary ventilation, and response to care were                        monitored throughout the procedure. The physical status                        of the patient was re-assessed after the procedure.                       After obtaining informed consent, the endoscope was                        passed under direct vision. Throughout the procedure,                        the patient's blood pressure, pulse, and oxygen                        saturations were monitored continuously. The Endoscope                        was introduced through the mouth, and advanced to the                        second part of duodenum. The upper GI endoscopy was  somewhat difficult. The patient tolerated the procedure                        fairly well. Findings:      The duodenal bulb and second portion of the duodenum were normal.      A 1 cm hiatal hernia was present.      The entire examined stomach was normal. Biopsies were taken with a cold       forceps for Helicobacter pylori testing.      The cardia and gastric fundus were normal on retroflexion.      The gastroesophageal junction was normal.      Mucosal changes characterized by feline  appearence were found in the       middle third of the esophagus and in the lower third of the esophagus.       Biopsies were taken with a cold forceps for histology to r/o EoE. Impression:           - Normal duodenal bulb and second portion of the                        duodenum.                       - 1 cm hiatal hernia.                       - Normal stomach. Biopsied.                       - Normal gastroesophageal junction.                       - Feline appearence mucosa in the esophagus. Biopsied. Recommendation:       - Await pathology results.                       - Proceed with colonoscopy as scheduled                       - See colonoscopy report Procedure Code(s):    --- Professional ---                       [id number], Esophagogastroduodenoscopy, flexible, transoral;                        with biopsy, single or multiple Diagnosis Code(s):    --- Professional ---                       K44.9, Diaphragmatic hernia without obstruction or                        gangrene                       R10.13, Epigastric pain                       K27.9, Peptic ulcer, site unspecified, unspecified as                        acute or chronic, without hemorrhage or perforation CPT copyright 2017 American Medical Association. All rights reserved. The  codes documented in this report are preliminary and upon coder review may  be revised to meet current compliance requirements. Dr. Ulyess Mort Lin Landsman MD, MD 06/12/2018 10:58:25 AM This report has been signed electronically. Number of Addenda: 0 Note Initiated On: 06/12/2018 10:31 AM      [facility]

## 2018-06-12 NOTE — Anesthesia Post-op Follow-up Note (Signed)
Anesthesia QCDR form completed.        

## 2018-06-12 NOTE — Anesthesia Procedure Notes (Signed)
Date/Time: 06/12/2018 10:54 AM Performed by: Nelda Marseille, CRNA Pre-anesthesia Checklist: Patient identified, Emergency Drugs available, Suction available, Patient being monitored and Timeout performed Oxygen Delivery Method: Nasal cannula

## 2018-06-12 NOTE — Transfer of Care (Addendum)
Immediate Anesthesia Transfer of Care Note  Patient: Meagan York  Procedure(s) Performed: ESOPHAGOGASTRODUODENOSCOPY (EGD) WITH PROPOFOL (N/A ) COLONOSCOPY WITH PROPOFOL (N/A )  Patient Location: PACU  Anesthesia Type:General  Level of Consciousness: awake, alert  and oriented  Airway & Oxygen Therapy: Patient Spontanous Breathing and Patient connected to nasal cannula oxygen  Post-op Assessment: Report given to RN and Post -op Vital signs reviewed and stable  Post vital signs: Reviewed and stable  Last Vitals:  Vitals Value Taken Time  BP    Temp    Pulse    Resp    SpO2      Last Pain:  Vitals:   06/12/18 0832  TempSrc: Tympanic  PainSc: 0-No pain         Complications: No apparent anesthesia complications

## 2018-06-13 NOTE — Anesthesia Postprocedure Evaluation (Signed)
Anesthesia Post Note  Patient: Meagan York  Procedure(s) Performed: ESOPHAGOGASTRODUODENOSCOPY (EGD) WITH PROPOFOL (N/A ) COLONOSCOPY WITH PROPOFOL (N/A )  Patient location during evaluation: PACU Anesthesia Type: General Level of consciousness: awake and alert Pain management: pain level controlled Vital Signs Assessment: post-procedure vital signs reviewed and stable Respiratory status: spontaneous breathing, nonlabored ventilation and respiratory function stable Cardiovascular status: blood pressure returned to baseline and stable Postop Assessment: no apparent nausea or vomiting Anesthetic complications: no     Last Vitals:  Vitals:   06/12/18 1151 06/12/18 1201  BP: 103/76 109/81  Pulse: 94 78  Resp: 20 16  Temp:    SpO2: 96% 100%    Last Pain:  Vitals:   06/12/18 1201  TempSrc:   PainSc: 0-No pain                 Durenda Hurt

## 2018-06-14 LAB — SURGICAL PATHOLOGY

## 2018-06-20 ENCOUNTER — Encounter: Payer: Self-pay | Admitting: Gastroenterology

## 2018-06-26 ENCOUNTER — Other Ambulatory Visit (HOSPITAL_COMMUNITY)
Admission: RE | Admit: 2018-06-26 | Discharge: 2018-06-26 | Disposition: A | Discharge status: Home / Self Care | Payer: 59 | Source: Ambulatory Visit | Attending: Family Medicine | Admitting: Family Medicine

## 2018-06-26 ENCOUNTER — Encounter: Payer: Self-pay | Admitting: Family Medicine

## 2018-06-26 ENCOUNTER — Ambulatory Visit (INDEPENDENT_AMBULATORY_CARE_PROVIDER_SITE_OTHER): Payer: 59 | Admitting: Family Medicine

## 2018-06-26 ENCOUNTER — Ambulatory Visit: Payer: 59

## 2018-06-26 ENCOUNTER — Other Ambulatory Visit: Payer: Self-pay | Admitting: Family Medicine

## 2018-06-26 VITALS — BP 148/81 | HR 82 | Temp 98.8°F | Ht 62.2 in | Wt 201.1 lb

## 2018-06-26 DIAGNOSIS — J449 Chronic obstructive pulmonary disease, unspecified: Secondary | ICD-10-CM | POA: Diagnosis not present

## 2018-06-26 DIAGNOSIS — K219 Gastro-esophageal reflux disease without esophagitis: Secondary | ICD-10-CM

## 2018-06-26 DIAGNOSIS — Z124 Encounter for screening for malignant neoplasm of cervix: Secondary | ICD-10-CM

## 2018-06-26 DIAGNOSIS — I10 Essential (primary) hypertension: Secondary | ICD-10-CM | POA: Diagnosis not present

## 2018-06-26 DIAGNOSIS — Z720 Tobacco use: Secondary | ICD-10-CM

## 2018-06-26 DIAGNOSIS — M1990 Unspecified osteoarthritis, unspecified site: Secondary | ICD-10-CM

## 2018-06-26 DIAGNOSIS — E6609 Other obesity due to excess calories: Secondary | ICD-10-CM

## 2018-06-26 DIAGNOSIS — Z0000 Encounter for general adult medical examination without abnormal findings: Secondary | ICD-10-CM | POA: Diagnosis not present

## 2018-06-26 DIAGNOSIS — Z6833 Body mass index (BMI) 33.0-33.9, adult: Secondary | ICD-10-CM

## 2018-06-26 LAB — UA/M W/RFLX CULTURE, ROUTINE
Bilirubin, UA: NEGATIVE
Glucose, UA: NEGATIVE
Ketones, UA: NEGATIVE
LEUKOCYTES UA: NEGATIVE
Nitrite, UA: NEGATIVE
PH UA: 6 (ref 5.0–7.5)
PROTEIN UA: NEGATIVE
Specific Gravity, UA: 1.01 (ref 1.005–1.030)
Urobilinogen, Ur: 0.2 mg/dL (ref 0.2–1.0)

## 2018-06-26 LAB — MICROALBUMIN, URINE WAIVED
CREATININE, URINE WAIVED: 100 mg/dL (ref 10–300)
MICROALB, UR WAIVED: 30 mg/L — AB (ref 0–19)
Microalb/Creat Ratio: <30 mg/g (ref <30)

## 2018-06-26 LAB — MICROSCOPIC EXAMINATION

## 2018-06-26 MED ORDER — ALBUTEROL SULFATE HFA 108 (90 BASE) MCG/ACT IN AERS: 1.0000 | INHALATION_SPRAY | RESPIRATORY_TRACT | 6 refills | Status: DC | PRN

## 2018-06-26 MED ORDER — CELECOXIB 200 MG PO CAPS: ORAL_CAPSULE | ORAL | 1 refills | Status: DC

## 2018-06-26 MED ORDER — UMECLIDINIUM-VILANTEROL 62.5-25 MCG/INH IN AEPB: 1.0000 | INHALATION_SPRAY | Freq: Every day | RESPIRATORY_TRACT | 3 refills | Status: DC

## 2018-06-26 MED ORDER — PANTOPRAZOLE SODIUM 40 MG PO TBEC: 40.0000 mg | DELAYED_RELEASE_TABLET | Freq: Two times a day (BID) | ORAL | 1 refills | Status: DC

## 2018-06-26 MED ORDER — LISINOPRIL 20 MG PO TABS: 20.0000 mg | ORAL_TABLET | Freq: Every day | ORAL | 1 refills | Status: DC

## 2018-06-26 NOTE — Assessment & Plan Note (Signed)
Not under good control. Will start anoro. Recheck 6 months unless not getting better.

## 2018-06-26 NOTE — Progress Notes (Addendum)
BP (!) 148/81   Pulse 82   Temp 98.8 F (37.1 C) (Oral)   Ht 5' 2.2" (1.58 m)   Wt 201 lb 1.6 oz (91.2 kg)   SpO2 96%   BMI 36.55 kg/m    Subjective:    Patient ID: Meagan York, female    DOB: 10/14/1955, 62 y.o.   MRN: [id number]  HPI: Meagan York is a 62 y.o. female presenting on 06/26/2018 for comprehensive medical examination. Current medical complaints include:  HYPERTENSION Hypertension status: stable  Satisfied with current treatment? yes Duration of hypertension: chronic BP monitoring frequency:  not checking BP medication side effects:  no Medication compliance: good compliance Previous BP meds: lisinopril Aspirin: no Recurrent headaches: no Visual changes: no Palpitations: no Dyspnea: no Chest pain: no Lower extremity edema: no Dizzy/lightheaded: no  COPD- has been feeling short of breath for about the past 2 months, thinks that she is getting worse.  COPD status: worse Satisfied with current treatment?: no Oxygen use: no Dyspnea frequency: several times a day Cough frequency: several times a day Rescue inhaler frequency: 3-4 times a day Limitation of activity: yes Productive cough:  Last Spirometry:  Pneumovax: Up to Date Influenza: Up to Date  Menopausal Symptoms: no  Depression Screen done today and results listed below:  Depression screen [facility] 2/9 06/26/2018 04/20/2018 02/24/2017 05/20/2015  Decreased Interest 0 3 2 0  Down, Depressed, Hopeless 0 0 0 0  PHQ - 2 Score 0 3 2 0  Altered sleeping 3 3 - -  Tired, decreased energy 3 3 - -  Change in appetite 0 1 - -  Feeling bad or failure about yourself  0 1 - -  Trouble concentrating 0 2 - -  Moving slowly or fidgety/restless 0 0 - -  Suicidal thoughts 0 0 - -  PHQ-9 Score 6 13 - -  Difficult doing work/chores - Somewhat difficult - -    Past Medical History:  Past Medical History:  Diagnosis Date  . Arthritis   . Bilateral breast cysts   . 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 Complication of anesthesia    pt woke up during breast surgery (1995)  . COPD (chronic obstructive pulmonary disease) ([place])   . GERD (gastroesophageal reflux disease)   . Hemorrhoids   . Irritable bowel disease   . Personal history of radiation therapy 2016   mammosite    Surgical History:  Past Surgical History:  Procedure Laterality Date  . APPENDECTOMY    . BREAST BIOPSY Right 01-01-15   BIPHASIC FIBROEPITHELIAL LESION with ADH  . BREAST LUMPECTOMY Right 04/25/2015   Procedure: RIGHT BREAST LUMPECTOMY, exicision of skin tag right face;  Surgeon: Christene Lye, MD;  Location: [facility] ORS;  Service: General;  Laterality: Right;  . BREAST MAMMOSITE Right 05-21-15  . BREAST SURGERY Left    cyst removal  . COLONOSCOPY WITH PROPOFOL N/A 07/07/2015   Procedure: COLONOSCOPY WITH PROPOFOL;  Surgeon: Josefine Class, MD;  Location: [facility] ENDOSCOPY;  Service: Endoscopy;  Laterality: N/A;  . COLONOSCOPY WITH PROPOFOL N/A 06/12/2018   Procedure: COLONOSCOPY WITH PROPOFOL;  Surgeon: Lin Landsman, MD;  Location: [facility] ENDOSCOPY;  Service: Gastroenterology;  Laterality: N/A;  . cyst lower spine    . ESOPHAGOGASTRODUODENOSCOPY (EGD) WITH PROPOFOL N/A 07/07/2015   Procedure: ESOPHAGOGASTRODUODENOSCOPY (EGD) WITH PROPOFOL;  Surgeon: Josefine Class, MD;  Location: [facility] ENDOSCOPY;  Service: Endoscopy;  Laterality: N/A;  . ESOPHAGOGASTRODUODENOSCOPY (EGD) WITH PROPOFOL N/A 06/12/2018   Procedure: ESOPHAGOGASTRODUODENOSCOPY (EGD) WITH PROPOFOL;  Surgeon: Lin Landsman, MD;  Location: [facility] ENDOSCOPY;  Service: Gastroenterology;  Laterality: N/A;  . KNEE SURGERY    . piondial cyst excision  03/1989    Medications:  Current Outpatient Medications on File Prior to Visit  Medication Sig  . Cholecalciferol (VITAMIN D3) 1000 units CAPS Take 1,000 Units by mouth daily.  . sucralfate (CARAFATE) 1 g tablet Take 1 tablet (1 g total) by  mouth 4 (four) times daily -  with meals and at bedtime.   No current facility-administered medications on file prior to visit.     Allergies:  Allergies  Allergen Reactions  . Aspirin   . Tramadol Nausea And Vomiting    Social History:  Social History   Socioeconomic History  . Marital status: Single    Spouse name: Not on file  . Number of children: Not on file  . Years of education: Not on file  . Highest education level: Not on file  Occupational History  . Not on file  Social Needs  . Financial resource strain: Not on file  . Food insecurity:    Worry: Not on file    Inability: Not on file  . Transportation needs:    Medical: Not on file    Non-medical: Not on file  Tobacco Use  . Smoking status: Former Smoker    Packs/day: 1.00    Years: 40.00    Pack years: 40.00    Types: Cigarettes    Last attempt to quit: 06/05/2018    Years since quitting: 0.0  . Smokeless tobacco: Never Used  Substance and Sexual Activity  . Alcohol use: No    Alcohol/week: 0.0 standard drinks  . Drug use: No  . Sexual activity: Never  Lifestyle  . Physical activity:    Days per week: Not on file    Minutes per session: Not on file  . Stress: Not on file  Relationships  . Social connections:    Talks on phone: Not on file    Gets together: Not on file    Attends religious service: Not on file    Active member of club or organization: Not on file    Attends meetings of clubs or organizations: Not on file    Relationship status: Not on file  . Intimate partner violence:    Fear of current or ex partner: Not on file    Emotionally abused: Not on file    Physically abused: Not on file    Forced sexual activity: Not on file  Other Topics Concern  . Not on file  Social History Narrative  . Not on file   Social History   Tobacco Use  Smoking Status Former Smoker  . Packs/day: 1.00  . Years: 40.00  . Pack years: 40.00  . Types: Cigarettes  . Last attempt to quit:  06/05/2018  . Years since quitting: 0.0  Smokeless Tobacco Never Used   Social History   Substance and Sexual Activity  Alcohol Use No  . Alcohol/week: 0.0 standard drinks    Family History:  Family History  Problem Relation Age of Onset  . Colon cancer Father        age 15  . Anemia Father   . Diabetes Father   . Hypertension Father   . Diverticulosis Mother   . COPD Mother   . Hypertension Brother   . Alzheimer's  disease Paternal Grandfather     Past medical history, surgical history, medications, allergies, family history and social history reviewed with patient today and changes made to appropriate areas of the chart.   Review of Systems  Constitutional: Negative.   Eyes: Negative.   Respiratory: Positive for cough, shortness of breath and wheezing. Negative for hemoptysis and sputum production.   Cardiovascular: Positive for leg swelling. Negative for chest pain, palpitations, orthopnea, claudication and PND.  Gastrointestinal: Positive for blood in stool (occasionally due to hemorrhoids), constipation, diarrhea and heartburn (does pretty well on the medicine). Negative for abdominal pain, melena, nausea and vomiting.  Genitourinary: Negative.   Musculoskeletal: Negative.   Skin: Negative.   Neurological: Negative.   Endo/Heme/Allergies: Positive for polydipsia. Negative for environmental allergies. Does not bruise/bleed easily.  Psychiatric/Behavioral: Negative.     All other ROS negative except what is listed above and in the HPI.      Objective:    BP (!) 148/81   Pulse 82   Temp 98.8 F (37.1 C) (Oral)   Ht 5' 2.2" (1.58 m)   Wt 201 lb 1.6 oz (91.2 kg)   SpO2 96%   BMI 36.55 kg/m   Wt Readings from Last 3 Encounters:  06/26/18 201 lb 1.6 oz (91.2 kg)  06/12/18 200 lb (90.7 kg)  06/06/18 204 lb 6.4 oz (92.7 kg)    Physical Exam  Constitutional: She is oriented to person, place, and time. She appears well-developed and well-nourished. No distress.    HENT:  Head: Normocephalic and atraumatic.  Right Ear: Hearing, tympanic membrane, external ear and ear canal normal.  Left Ear: Hearing, tympanic membrane, external ear and ear canal normal.  Nose: Nose normal.  Mouth/Throat: Uvula is midline, oropharynx is clear and moist and mucous membranes are normal. No oropharyngeal exudate.  Eyes: Pupils are equal, round, and reactive to light. Conjunctivae, EOM and lids are normal. Right eye exhibits no discharge. Left eye exhibits no discharge. No scleral icterus.  Neck: Normal range of motion. Neck supple. No JVD present. No tracheal deviation present. No thyromegaly present.  Cardiovascular: Normal rate, regular rhythm, normal heart sounds and intact distal pulses. Exam reveals no gallop and no friction rub.  No murmur heard. Pulmonary/Chest: Effort normal and breath sounds normal. No stridor. No respiratory distress. She has no wheezes. She has no rales. She exhibits no tenderness. Right breast exhibits no inverted nipple, no mass, no nipple discharge, no skin change and no tenderness. Left breast exhibits no inverted nipple, no mass, no nipple discharge, no skin change and no tenderness. No breast swelling, tenderness, discharge or bleeding. Breasts are symmetrical.  Abdominal: Soft. Bowel sounds are normal. She exhibits no distension and no mass. There is no tenderness. There is no rebound and no guarding. No hernia. Hernia confirmed negative in the right inguinal area and confirmed negative in the left inguinal area.  Genitourinary: Vagina normal. No labial fusion. There is no rash, tenderness, lesion or injury on the right labia. There is no rash, tenderness, lesion or injury on the left labia. Cervix exhibits no motion tenderness, no discharge and no friability. No erythema, tenderness or bleeding in the vagina. No foreign body in the vagina. No signs of injury around the vagina. No vaginal discharge found.  Musculoskeletal: Normal range of motion.  She exhibits no edema, tenderness or deformity.  Lymphadenopathy:    She has no cervical adenopathy. No inguinal adenopathy noted on the right or left side.  Neurological: She is alert and  oriented to person, place, and time. She displays normal reflexes. No cranial nerve deficit or sensory deficit. She exhibits normal muscle tone. Coordination normal.  Skin: Skin is warm, dry and intact. Capillary refill takes less than 2 seconds. No rash noted. She is not diaphoretic. No erythema. No pallor.  Psychiatric: She has a normal mood and affect. Her speech is normal and behavior is normal. Judgment and thought content normal. Cognition and memory are normal.  Nursing note and vitals reviewed. Pelvic and Breast exam done today with Yvonna Alanis, CMA in attendance.   Results for orders placed or performed during the hospital encounter of 06/12/18  Surgical pathology  Result Value Ref Range   SURGICAL PATHOLOGY      Surgical Pathology CASE: [id number] PATIENT: [place] [facility]ology Report     SPECIMEN SUBMITTED: A. Stomach, random, r/o h pylori; cbxs B. Esophagus, random, r/o eoe; cbxs C. Colon polyp, descending; hot snare  CLINICAL HISTORY: None provided  PRE-OPERATIVE DIAGNOSIS: Dyspepsia R10.13, rectal bleeding K62.5  POST-OPERATIVE DIAGNOSIS: Hiatal hernia, colon polyp, external hemorrhoids     DIAGNOSIS: A. STOMACH, RANDOM; COLD BIOPSY: - ANTRAL MUCOSA WITH MILD CHRONIC GASTRITIS, NON-SPECIFIC. - OXYNTIC MUCOSA WITH CHANGES CONSISTENT WITH PROTON PUMP INHIBITOR USE. - NEGATIVE FOR H. PYLORI, DYSPLASIA, AND MALIGNANCY.  B.  ESOPHAGUS, RANDOM; COLD BIOPSY: - UNREMARKABLE SQUAMOUS MUCOSA. - NEGATIVE FOR EOSINOPHILS, DYSPLASIA, AND MALIGNANCY.  C.  COLON POLYP, DESCENDING; HOT SNARE: - COLONIC MUCOSA WITH MARKED THERMAL ARTIFACT LIMITING INTERPRETATION.   GROSS DESCRIPTION: A. Labeled: Random gastric cbx rule out H. pylori Received: In formali  n Tissue fragment(s): 3 Size: Less than 0.1-0.4 cm Description: Pink-tan fragments Entirely submitted in one cassette.  B. Labeled: Random esophagus cbx rule out EOE Received: In formalin Tissue fragment(s): 3 Size: 0.3-0.4 cm Description: Wispy gray fragments Entirely submitted in one cassette.  C. Labeled: Descending colon polyp hot snare Received: In formalin Tissue fragment(s): 1 Size: 0.5 cm Description: Red-tan fragment Entirely submitted in one cassette.    Final Diagnosis performed by Quay Burow, MD.   Electronically signed 06/14/2018 6:43:10AM The electronic signature indicates that the named Attending Pathologist has evaluated the specimen  Technical component performed at [facility], [address], [place], [place] [address] Lab: [phone] Dir: Rush Farmer, MD, MMM  Professional component performed at [facility], [facility], [place], [place], [place] [address] Lab: [phone] Dir: Dellia Nims. Reuel Derby, MD       Assessment & Plan:   Problem List Items Addressed This Visit      Cardiovascular and Mediastinum   HTN (hypertension)    Under good control on current regimen. Continue current regimen. Continue to monitor. Call with any concerns. Refills given.        Relevant Medications   lisinopril (PRINIVIL,ZESTRIL) 20 MG tablet   Other Relevant Orders   CBC with Differential/Platelet   Comprehensive metabolic panel   Lipid Panel w/o Chol/HDL Ratio   Microalbumin, Urine Waived   TSH   UA/M w/rflx Culture, Routine     Respiratory   COPD (chronic obstructive pulmonary disease) ([place])    Not under good control. Will start anoro. Recheck 6 months unless not getting better.       Relevant Medications   albuterol (PROAIR HFA) 108 (90 Base) MCG/ACT inhaler   umeclidinium-vilanterol (ANORO ELLIPTA) 62.5-25 MCG/INH AEPB   Other Relevant Orders   CBC with Differential/Platelet   Comprehensive metabolic panel   Lipid Panel w/o  Chol/HDL Ratio   TSH   UA/M  w/rflx Culture, Routine     Digestive   GERD (gastroesophageal reflux disease)    Under good control on current regimen. Continue current regimen. Continue to monitor. Call with any concerns. Refills given.        Relevant Medications   pantoprazole (PROTONIX) 40 MG tablet   Other Relevant Orders   CBC with Differential/Platelet   Comprehensive metabolic panel   Lipid Panel w/o Chol/HDL Ratio   TSH   UA/M w/rflx Culture, Routine     Musculoskeletal and Integument   Arthritis    Under good control on current regimen. Continue current regimen. Continue to monitor. Call with any concerns. Refills given.        Relevant Medications   celecoxib (CELEBREX) 200 MG capsule     Other   Tobacco abuse    Candidate for lung cancer screening. Message to Burgess Estelle, RN sent today.      Relevant Orders   CBC with Differential/Platelet   Comprehensive metabolic panel   Lipid Panel w/o Chol/HDL Ratio   TSH   UA/M w/rflx Culture, Routine   Class 1 obesity due to excess calories with serious comorbidity and body mass index (BMI) of 33.0 to 33.9 in adult    Encouraged weight loss with goal of 1-2lbs per week. Call with any concerns.        Other Visit Diagnoses    Routine general medical examination at a health care facility    -  Primary   Vaccines up to date. Screening labs checked today. Pap done. Mammogram and colonoscopy up to date. Call with any concerns.    Relevant Orders   Lipid Panel w/o Chol/HDL Ratio   TSH   UA/M w/rflx Culture, Routine   Screening for cervical cancer       Pap done today.   Relevant Orders   Cytology - PAP       Follow up plan: Return in about 6 months (around 12/26/2018).   LABORATORY TESTING:  - Pap smear: pap done  IMMUNIZATIONS:   - Tdap: Tetanus vaccination status reviewed: last tetanus booster within 10 years. - Influenza: Administered today - Pneumovax: Up to date - Prevnar: Not  applicable  SCREENING: -Mammogram: Up to date  - Colonoscopy: Up to date   PATIENT COUNSELING:   Advised to take 1 mg of folate supplement per day if capable of pregnancy.   Sexuality: Discussed sexually transmitted diseases, partner selection, use of condoms, avoidance of unintended pregnancy  and contraceptive alternatives.   Advised to avoid cigarette smoking.  I discussed with the patient that most people either abstain from alcohol or drink within safe limits (<=14/week and <=4 drinks/occasion for males, <=7/weeks and <= 3 drinks/occasion for females) and that the risk for alcohol disorders and other health effects rises proportionally with the number of drinks per week and how often a drinker exceeds daily limits.  Discussed cessation/primary prevention of drug use and availability of treatment for abuse.   Diet: Encouraged to adjust caloric intake to maintain  or achieve ideal body weight, to reduce intake of dietary saturated fat and total fat, to limit sodium intake by avoiding high sodium foods and not adding table salt, and to maintain adequate dietary potassium and calcium preferably from fresh fruits, vegetables, and low-fat dairy products.    stressed the importance of regular exercise  Injury prevention: Discussed safety belts, safety helmets, smoke detector, smoking near bedding or upholstery.   Dental health: Discussed importance of regular tooth brushing, flossing, and  dental visits.    NEXT PREVENTATIVE PHYSICAL DUE IN 1 YEAR. Return in about 6 months (around 12/26/2018).

## 2018-06-26 NOTE — Assessment & Plan Note (Signed)
Candidate for lung cancer screening. Message to Burgess Estelle, RN sent today.

## 2018-06-26 NOTE — Assessment & Plan Note (Signed)
Under good control on current regimen. Continue current regimen. Continue to monitor. Call with any concerns. Refills given.   

## 2018-06-26 NOTE — Assessment & Plan Note (Signed)
Encouraged weight loss with goal of 1-2lbs per week. Call with any concerns.

## 2018-06-27 ENCOUNTER — Encounter: Payer: Self-pay | Admitting: Family Medicine

## 2018-06-27 ENCOUNTER — Encounter: Payer: Self-pay | Admitting: *Deleted

## 2018-06-27 ENCOUNTER — Telehealth: Payer: Self-pay | Admitting: *Deleted

## 2018-06-27 ENCOUNTER — Telehealth: Payer: Self-pay | Admitting: Family Medicine

## 2018-06-27 DIAGNOSIS — Z122 Encounter for screening for malignant neoplasm of respiratory organs: Secondary | ICD-10-CM

## 2018-06-27 LAB — CBC WITH DIFFERENTIAL/PLATELET
BASOS: 1 %
Basophils Absolute: 0.1 10*3/uL (ref 0.0–0.2)
EOS (ABSOLUTE): 0.3 10*3/uL (ref 0.0–0.4)
Eos: 5 %
Hematocrit: 36.6 % (ref 34.0–46.6)
Hemoglobin: 12.8 g/dL (ref 11.1–15.9)
Immature Grans (Abs): 0 10*3/uL (ref 0.0–0.1)
Immature Granulocytes: 1 %
Lymphocytes Absolute: 2 10*3/uL (ref 0.7–3.1)
Lymphs: 31 %
MCH: 30.2 pg (ref 26.6–33.0)
MCHC: 35 g/dL (ref 31.5–35.7)
MCV: 86 fL (ref 79–97)
MONOS ABS: 0.5 10*3/uL (ref 0.1–0.9)
Monocytes: 8 %
NEUTROS ABS: 3.5 10*3/uL (ref 1.4–7.0)
Neutrophils: 54 %
Platelets: 303 10*3/uL (ref 150–450)
RBC: 4.24 x10E6/uL (ref 3.77–5.28)
RDW: 12.4 % (ref 12.3–15.4)
WBC: 6.4 10*3/uL (ref 3.4–10.8)

## 2018-06-27 LAB — COMPREHENSIVE METABOLIC PANEL
A/G RATIO: 1.4 (ref 1.2–2.2)
ALK PHOS: 98 IU/L (ref 39–117)
ALT: 19 IU/L (ref 0–32)
AST: 16 IU/L (ref 0–40)
Albumin: 3.9 g/dL (ref 3.6–4.8)
BILIRUBIN TOTAL: 0.4 mg/dL (ref 0.0–1.2)
BUN/Creatinine Ratio: 9 — ABNORMAL LOW (ref 12–28)
BUN: 6 mg/dL — ABNORMAL LOW (ref 8–27)
CHLORIDE: 103 mmol/L (ref 96–106)
CO2: 24 mmol/L (ref 20–29)
Calcium: 9.2 mg/dL (ref 8.7–10.3)
Creatinine, Ser: 0.69 mg/dL (ref 0.57–1.00)
GFR calc Af Amer: 109 mL/min/{1.73_m2} (ref >59)
GFR calc non Af Amer: 95 mL/min/{1.73_m2} (ref >59)
Globulin, Total: 2.7 g/dL (ref 1.5–4.5)
Glucose: 99 mg/dL (ref 65–99)
POTASSIUM: 3.9 mmol/L (ref 3.5–5.2)
Sodium: 144 mmol/L (ref 134–144)
Total Protein: 6.6 g/dL (ref 6.0–8.5)

## 2018-06-27 LAB — LIPID PANEL W/O CHOL/HDL RATIO
Cholesterol, Total: 204 mg/dL — ABNORMAL HIGH (ref 100–199)
HDL: 51 mg/dL (ref >39)
LDL Calculated: 123 mg/dL — ABNORMAL HIGH (ref 0–99)
TRIGLYCERIDES: 151 mg/dL — AB (ref 0–149)
VLDL Cholesterol Cal: 30 mg/dL (ref 5–40)

## 2018-06-27 LAB — TSH: TSH: 2.02 u[IU]/mL (ref 0.450–4.500)

## 2018-06-27 MED ORDER — BENZONATATE 200 MG PO CAPS: 200.0000 mg | ORAL_CAPSULE | Freq: Two times a day (BID) | ORAL | 0 refills | Status: DC | PRN

## 2018-06-27 NOTE — Telephone Encounter (Signed)
Copied from [place] [id number]. Topic: General - Other >> Jun 27, 2018 11:11 AM Meagan York A wrote: Reason for CRM: Pt says she is experiencing a consistent cough, and she would like something sent over to the pharmacy to help control her cough while she is at work.

## 2018-06-27 NOTE — Telephone Encounter (Signed)
Received referral for low dose lung cancer screening CT scan.  Message left at phone number listed in EMR for patient to call either myself or Shawn Perkins back at [phone] to facilitate scheduling the scan.    

## 2018-06-27 NOTE — Telephone Encounter (Signed)
Received a referral for initial lung cancer screening scan.  Contacted the patient and obtained their smoking history, former smoker quit 3 weeks ago, 66pkyr history   as well as answering questions related to screening process.  Patient denies signs of lung cancer such as weight loss or hemoptysis at this time.  Patient denies comorbidity that would prevent curative treatment if lung cancer were found.  Patient is scheduled for the Shared Decision Making Visit and CT scan on 07-13-18@0930 .

## 2018-06-30 ENCOUNTER — Ambulatory Visit: Payer: 59 | Admitting: Gastroenterology

## 2018-06-30 LAB — CYTOLOGY - PAP
ADEQUACY: ABSENT
DIAGNOSIS: NEGATIVE
HPV (WINDOPATH): DETECTED — AB
HPV 16/18/45 genotyping: NEGATIVE

## 2018-07-01 ENCOUNTER — Telehealth: Payer: Self-pay | Admitting: Family Medicine

## 2018-07-03 ENCOUNTER — Telehealth: Payer: Self-pay | Admitting: Family Medicine

## 2018-07-03 ENCOUNTER — Encounter: Payer: Self-pay | Admitting: Emergency Medicine

## 2018-07-03 ENCOUNTER — Emergency Department
Admission: EM | Admit: 2018-07-03 | Discharge: 2018-07-04 | Disposition: A | Discharge status: Home / Self Care | Payer: 59 | Attending: Emergency Medicine | Admitting: Emergency Medicine

## 2018-07-03 ENCOUNTER — Emergency Department: Payer: 59

## 2018-07-03 DIAGNOSIS — Z87891 Personal history of nicotine dependence: Secondary | ICD-10-CM | POA: Insufficient documentation

## 2018-07-03 DIAGNOSIS — I10 Essential (primary) hypertension: Secondary | ICD-10-CM | POA: Insufficient documentation

## 2018-07-03 DIAGNOSIS — J449 Chronic obstructive pulmonary disease, unspecified: Secondary | ICD-10-CM | POA: Diagnosis not present

## 2018-07-03 DIAGNOSIS — Z79899 Other long term (current) drug therapy: Secondary | ICD-10-CM | POA: Insufficient documentation

## 2018-07-03 DIAGNOSIS — J209 Acute bronchitis, unspecified: Secondary | ICD-10-CM | POA: Diagnosis not present

## 2018-07-03 DIAGNOSIS — Z853 Personal history of malignant neoplasm of breast: Secondary | ICD-10-CM | POA: Insufficient documentation

## 2018-07-03 DIAGNOSIS — R0781 Pleurodynia: Secondary | ICD-10-CM

## 2018-07-03 DIAGNOSIS — R0789 Other chest pain: Secondary | ICD-10-CM | POA: Insufficient documentation

## 2018-07-03 DIAGNOSIS — R8789 Other abnormal findings in specimens from female genital organs: Secondary | ICD-10-CM

## 2018-07-03 DIAGNOSIS — R87618 Other abnormal cytological findings on specimens from cervix uteri: Secondary | ICD-10-CM | POA: Insufficient documentation

## 2018-07-03 DIAGNOSIS — R079 Chest pain, unspecified: Secondary | ICD-10-CM | POA: Diagnosis present

## 2018-07-03 LAB — CBC
HCT: 40 % (ref 36.0–46.0)
Hemoglobin: 12.5 g/dL (ref 12.0–15.0)
MCH: 29 pg (ref 26.0–34.0)
MCHC: 31.3 g/dL (ref 30.0–36.0)
MCV: 92.8 fL (ref 80.0–100.0)
NRBC: 0 % (ref 0.0–0.2)
PLATELETS: 269 10*3/uL (ref 150–400)
RBC: 4.31 MIL/uL (ref 3.87–5.11)
RDW: 12.6 % (ref 11.5–15.5)
WBC: 8.5 10*3/uL (ref 4.0–10.5)

## 2018-07-03 LAB — BASIC METABOLIC PANEL
ANION GAP: 5 (ref 5–15)
BUN: 10 mg/dL (ref 6–20)
CALCIUM: 8.6 mg/dL — AB (ref 8.9–10.3)
CO2: 29 mmol/L (ref 22–32)
Chloride: 105 mmol/L (ref 98–111)
Creatinine, Ser: 0.73 mg/dL (ref 0.44–1.00)
GFR calc Af Amer: >60 mL/min (ref >60)
GFR calc non Af Amer: >60 mL/min (ref >60)
GLUCOSE: 105 mg/dL — AB (ref 70–99)
POTASSIUM: 3.4 mmol/L — AB (ref 3.5–5.1)
Sodium: 139 mmol/L (ref 135–145)

## 2018-07-03 LAB — TROPONIN I: Troponin I: <0.03 ng/mL (ref <0.03)

## 2018-07-03 MED ORDER — OXYCODONE HCL 5 MG PO TABS
5.0000 mg | ORAL_TABLET | Freq: Once | ORAL | Status: AC
  Administered 2018-07-03: 5 mg via ORAL
  Filled 2018-07-03: qty 1

## 2018-07-03 MED ORDER — IPRATROPIUM-ALBUTEROL 0.5-2.5 (3) MG/3ML IN SOLN
RESPIRATORY_TRACT | Status: AC
  Administered 2018-07-03: 6 mL
  Filled 2018-07-03: qty 6

## 2018-07-03 MED ORDER — PREDNISONE 20 MG PO TABS
60.0000 mg | ORAL_TABLET | Freq: Once | ORAL | Status: AC
  Administered 2018-07-03: 60 mg via ORAL
  Filled 2018-07-03: qty 3

## 2018-07-03 NOTE — Telephone Encounter (Signed)
Requested Prescriptions  Refused Prescriptions Disp Refills  . albuterol (PROVENTIL HFA;VENTOLIN HFA) 108 (90 Base) MCG/ACT inhaler [Pharmacy Med Name: ALBUTEROL HFA (VENTOLIN) INH] 18 Inhaler 1    Sig: INHALE 1-2 PUFFS INTO THE LUNGS EVERY 4 (FOUR) HOURS AS NEEDED FOR WHEEZING OR SHORTNESS OF BREATH.     Pulmonology:  Beta Agonists Failed - 07/01/2018  9:14 AM      Failed - One inhaler should last at least one month. If the patient is requesting refills earlier, contact the patient to check for uncontrolled symptoms.      Passed - Valid encounter within last 12 months    Recent Outpatient Visits          1 week ago Routine general medical examination at a health care facility   [facility] [place] P, DO   1 month ago COPD exacerbation [facility])   Edwards, [place], PA-C   2 months ago Essential hypertension   [place], Megan P, DO   9 months ago Chronic obstructive pulmonary disease, unspecified COPD type [facility])   [facility] [facility], [place]   1 year ago Essential hypertension   [place], [place], DO      Future Appointments            In 5 months Wynetta Emery, Barb Merino, DO [employer]

## 2018-07-03 NOTE — ED Triage Notes (Signed)
Pt reports she has COPD and today was coughing when she felt a sharp pain on the right rib area, followed by a rush of heat in the area. Pt with SOB and right rib pain.

## 2018-07-03 NOTE — Telephone Encounter (Signed)
Requesting her albuterol too soon, if she's feeling worse, should be seen.

## 2018-07-03 NOTE — ED Notes (Signed)
Pt reports right anterior/posterior rib pain.  Sx began after coughing.  Hx copd.  Pt with wheezes tonight.  No chest pain.  Pt alert.  Speech clear.  Family with pt.

## 2018-07-03 NOTE — Telephone Encounter (Signed)
Patient notified via voicemail.

## 2018-07-03 NOTE — ED Notes (Signed)
ED Provider at bedside. 

## 2018-07-03 NOTE — Telephone Encounter (Signed)
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 Thanks!

## 2018-07-03 NOTE — ED Provider Notes (Signed)
[facility] Emergency Department Provider Note    First MD Initiated Contact with Patient 07/03/18 2318     (approximate)  I have reviewed the triage vital signs and the nursing notes.   HISTORY  Chief Complaint Chest Pain    HPI Meagan York is a 62 y.o. female below list of chronic medical conditions including COPD presents to the emergency department with right lateral chest pain which began while coughing tonight at work at 9 PM.  Patient describes the pain as a sharp pain that is currently 9 out of 10.  Patient denies any fever.   Past Medical History:  Diagnosis Date  . Arthritis   . Bilateral breast cysts   . Cancer (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 Complication of anesthesia    pt woke up during breast surgery (1995)  . COPD (chronic obstructive pulmonary disease) ([place])   . GERD (gastroesophageal reflux disease)   . Hemorrhoids   . Irritable bowel disease   . 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 Pap smear abnormality of cervix/human papillomavirus (HPV) positive 07/03/2018  . Class 1 obesity due to excess calories with serious comorbidity and body mass index (BMI) of 33.0 to 33.9 in adult 04/20/2018  . 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 Tobacco abuse 11/24/2015  . Heel pain, bilateral 10/24/2015  . Arthritis   . COPD (chronic obstructive pulmonary disease) ([place])   . GERD (gastroesophageal reflux disease)   . Upper GI bleed 07/05/2015  . History of breast cancer 04/25/2015    Past Surgical History:  Procedure Laterality Date  . APPENDECTOMY    . BREAST BIOPSY Right 01-01-15   BIPHASIC FIBROEPITHELIAL LESION with ADH  . BREAST LUMPECTOMY Right 04/25/2015   Procedure: RIGHT BREAST LUMPECTOMY, exicision of skin tag right face;  Surgeon: Christene Lye, MD;  Location: [facility] ORS;  Service:  General;  Laterality: Right;  . BREAST MAMMOSITE Right 05-21-15  . BREAST SURGERY Left    cyst removal  . COLONOSCOPY WITH PROPOFOL N/A 07/07/2015   Procedure: COLONOSCOPY WITH PROPOFOL;  Surgeon: Josefine Class, MD;  Location: [facility] ENDOSCOPY;  Service: Endoscopy;  Laterality: N/A;  . COLONOSCOPY WITH PROPOFOL N/A 06/12/2018   Procedure: COLONOSCOPY WITH PROPOFOL;  Surgeon: Lin Landsman, MD;  Location: [facility] ENDOSCOPY;  Service: Gastroenterology;  Laterality: N/A;  . cyst lower spine    . ESOPHAGOGASTRODUODENOSCOPY (EGD) WITH PROPOFOL N/A 07/07/2015   Procedure: ESOPHAGOGASTRODUODENOSCOPY (EGD) WITH PROPOFOL;  Surgeon: Josefine Class, MD;  Location: [facility] ENDOSCOPY;  Service: Endoscopy;  Laterality: N/A;  . ESOPHAGOGASTRODUODENOSCOPY (EGD) WITH PROPOFOL N/A 06/12/2018   Procedure: ESOPHAGOGASTRODUODENOSCOPY (EGD) WITH PROPOFOL;  Surgeon: Lin Landsman, MD;  Location: [facility] ENDOSCOPY;  Service: Gastroenterology;  Laterality: N/A;  . KNEE SURGERY    . piondial cyst excision  03/1989    Prior to Admission medications   Medication Sig Start Date End Date Taking? Authorizing Provider  albuterol (PROAIR HFA) 108 (90 Base) MCG/ACT inhaler Inhale 1-2 puffs into the lungs every 4 (four) hours as needed for wheezing or shortness of breath. 06/26/18   Johnson, Megan P, DO  benzonatate (TESSALON) 200 MG capsule Take 1 capsule (200 mg total) by mouth 2 (two) times daily as needed for cough. 06/27/18   Johnson, Megan P, DO  celecoxib (CELEBREX) 200 MG capsule TAKE 1  CAPSULE BY MOUTH EVERY DAY 06/26/18   Park Liter P, DO  Cholecalciferol (VITAMIN D3) 1000 units CAPS Take 1,000 Units by mouth daily.    [provider]  lisinopril (PRINIVIL,ZESTRIL) 20 MG tablet Take 1 tablet (20 mg total) by mouth daily. 06/26/18   Johnson, Megan P, DO  pantoprazole (PROTONIX) 40 MG tablet Take 1 tablet (40 mg total) by mouth 2 (two) times daily. 06/26/18   Johnson, Megan P, DO  sucralfate  (CARAFATE) 1 g tablet Take 1 tablet (1 g total) by mouth 4 (four) times daily -  with meals and at bedtime. 04/20/18   Johnson, Megan P, DO  umeclidinium-vilanterol (ANORO ELLIPTA) 62.5-25 MCG/INH AEPB Inhale 1 puff into the lungs daily. 06/26/18   Park Liter P, DO    Allergies Aspirin and Tramadol  Family History  Problem Relation Age of Onset  . Colon cancer Father        age 36  . Anemia Father   . Diabetes Father   . Hypertension Father   . Diverticulosis Mother   . COPD Mother   . Hypertension Brother   . Alzheimer's disease Paternal Grandfather     Social History Social History   Tobacco Use  . Smoking status: Former Smoker    Packs/day: 1.50    Years: 44.00    Pack years: 66.00    Types: Cigarettes    Last attempt to quit: 06/05/2018    Years since quitting: 0.0  . Smokeless tobacco: Never Used  Substance Use Topics  . Alcohol use: No    Alcohol/week: 0.0 standard drinks  . Drug use: No    Review of Systems Constitutional: No fever/chills Eyes: No visual changes. ENT: No sore throat. Cardiovascular: Denies chest pain. Respiratory: Positive for cough and dyspnea Gastrointestinal: No abdominal pain.  No nausea, no vomiting.  No diarrhea.  No constipation. Genitourinary: Negative for dysuria. Musculoskeletal: Negative for neck pain.  Negative for back pain. Integumentary: Negative for rash. Neurological: Negative for headaches, focal weakness or numbness.   ____________________________________________   PHYSICAL EXAM:  VITAL SIGNS: ED Triage Vitals [07/03/18 2219]  Enc Vitals Group     BP (!) 145/96     Pulse Rate 97     Resp (!) 21     Temp 97.9 F (36.6 C)     Temp Source Oral     SpO2 97 %     Weight      Height      Head Circumference      Peak Flow      Pain Score      Pain Loc      Pain Edu?      Excl. in [place]?     Constitutional: Alert and oriented. Well appearing and in no acute distress. Eyes: Conjunctivae are normal. Head:  Atraumatic. Mouth/Throat: Mucous membranes are moist.  Oropharynx non-erythematous. Neck: No stridor.   Cardiovascular: Normal rate, regular rhythm. Good peripheral circulation. Grossly normal heart sounds. Chest: Pain with palpation of the right ninth and 10th rib laterally. Respiratory: Normal respiratory effort.  No retractions.  Diffuse expiratory wheezing with rhonchi. Gastrointestinal: Soft and nontender. No distention.  Musculoskeletal: No lower extremity tenderness nor edema. No gross deformities of extremities. Neurologic:  Normal speech and language. No gross focal neurologic deficits are appreciated.  Skin:  Skin is warm, dry and intact. No rash noted. Psychiatric: Mood and affect are normal. Speech and behavior are normal.  ____________________________________________   LABS (all labs ordered are  listed, but only abnormal results are displayed)  Labs Reviewed  BASIC METABOLIC PANEL - Abnormal; Notable for the following components:      Result Value   Potassium 3.4 (*)    Glucose, Bld 105 (*)    Calcium 8.6 (*)    All other components within normal limits  CBC  TROPONIN I  POC URINE PREG, ED   ____________________________________________  EKG  ED ECG REPORT I, [place] N BROWN, the attending physician, personally viewed and interpreted this ECG.   Date: 07/04/2018  EKG Time: 17 p.m.  Rate: 89  Rhythm: Normal sinus rhythm  Axis: Normal  Intervals: Normal  ST&T Change: None  RADIOLOGY I, [place] N BROWN, personally viewed and evaluated these images (plain radiographs) as part of my medical decision making, as well as reviewing the written report by the radiologist.  ED MD interpretation:  No active cardiopulmonary disease.  Official radiology report(s): Dg Chest 2 View  Result Date: 07/03/2018 CLINICAL DATA:  Cough, chest pain, rib pain EXAM: CHEST - 2 VIEW COMPARISON:  06/19/2014 FINDINGS: Heart and mediastinal contours are within normal limits. No focal  opacities or effusions. No acute bony abnormality. IMPRESSION: No active cardiopulmonary disease. Electronically Signed   By: Rolm Baptise M.D.   On: 07/03/2018 22:36     Procedures   ____________________________________________   INITIAL IMPRESSION / ASSESSMENT AND PLAN / ED COURSE  As part of my medical decision making, I reviewed the following data within the electronic MEDICAL RECORD NUMBER   62 year old female present with above-stated history and physical exam consistent with right chest wall pain.  Patient also has symptoms consistent with acute bronchitis.  Patient given 2 DuoNeb's with resolution of wheezing.  Patient given oxycodone with improvement of pain.  Spoke with the patient at length regarding chest wall injury and the necessity of controlling her pain secondary to possibly of developing pneumonia.  Patient will be referred to Dr. Raul Del pulmonologist at her request for COPD management.  ____________________________________________  FINAL CLINICAL IMPRESSION(S) / ED DIAGNOSES  Final diagnoses:  Rib pain on right side  Acute bronchitis, unspecified organism     MEDICATIONS GIVEN DURING THIS VISIT:  Medications  predniSONE (DELTASONE) tablet 60 mg (has no administration in time range)  oxyCODONE (Oxy IR/ROXICODONE) immediate release tablet 5 mg (has no administration in time range)  ipratropium-albuterol (DUONEB) 0.5-2.5 (3) MG/3ML nebulizer solution (6 mLs  Given 07/03/18 2330)     ED Discharge Orders    None       Note:  This document was prepared using Dragon voice recognition software and may include unintentional dictation errors.    Gregor Hams, MD 07/04/18 0100

## 2018-07-04 MED ORDER — PREDNISONE 20 MG PO TABS: 60.0000 mg | ORAL_TABLET | Freq: Every day | ORAL | 0 refills | Status: AC

## 2018-07-04 MED ORDER — AZITHROMYCIN 500 MG PO TABS: 500.0000 mg | ORAL_TABLET | Freq: Every day | ORAL | 0 refills | Status: AC

## 2018-07-04 MED ORDER — OXYCODONE HCL 5 MG PO TABS: 5.0000 mg | ORAL_TABLET | Freq: Three times a day (TID) | ORAL | 0 refills | Status: DC | PRN

## 2018-07-13 ENCOUNTER — Ambulatory Visit
Admission: RE | Admit: 2018-07-13 | Discharge: 2018-07-13 | Disposition: A | Discharge status: Home / Self Care | Payer: 59 | Source: Ambulatory Visit | Attending: Nurse Practitioner | Admitting: Nurse Practitioner

## 2018-07-13 ENCOUNTER — Inpatient Hospital Stay: Admission: RE | Admit: 2018-07-13 | Payer: 59 | Source: Ambulatory Visit

## 2018-07-13 ENCOUNTER — Inpatient Hospital Stay: Payer: 59 | Attending: Nurse Practitioner | Admitting: Nurse Practitioner

## 2018-07-13 DIAGNOSIS — Z122 Encounter for screening for malignant neoplasm of respiratory organs: Secondary | ICD-10-CM | POA: Insufficient documentation

## 2018-07-13 DIAGNOSIS — Z87891 Personal history of nicotine dependence: Secondary | ICD-10-CM | POA: Diagnosis not present

## 2018-07-13 DIAGNOSIS — I700 Atherosclerosis of aorta: Secondary | ICD-10-CM | POA: Insufficient documentation

## 2018-07-13 DIAGNOSIS — J432 Centrilobular emphysema: Secondary | ICD-10-CM | POA: Diagnosis not present

## 2018-07-13 DIAGNOSIS — I2510 Atherosclerotic heart disease of native coronary artery without angina pectoris: Secondary | ICD-10-CM | POA: Diagnosis not present

## 2018-07-13 NOTE — Progress Notes (Signed)
In accordance with CMS guidelines, patient has met eligibility criteria including age, absence of signs or symptoms of lung cancer.  Social History   Tobacco Use  . Smoking status: Former Smoker    Packs/day: 1.50    Years: 44.00    Pack years: 66.00    Types: Cigarettes    Last attempt to quit: 06/05/2018    Years since quitting: 0.1  . Smokeless tobacco: Never Used  Substance Use Topics  . Alcohol use: No    Alcohol/week: 0.0 standard drinks  . Drug use: No      A shared decision-making session was conducted prior to the performance of CT scan. This includes one or more decision aids, includes benefits and harms of screening, follow-up diagnostic testing, over-diagnosis, false positive rate, and total radiation exposure.   Counseling on the importance of adherence to annual lung cancer LDCT screening, impact of co-morbidities, and ability or willingness to undergo diagnosis and treatment is imperative for compliance of the program.   Counseling on the importance of continued smoking cessation for former smokers; the importance of smoking cessation for current smokers, and information about tobacco cessation interventions have been given to patient including [place] Quit Smart and 1800 quit [place] programs.   Written order for lung cancer screening with LDCT has been given to the patient and any and all questions have been answered to the best of my abilities.    Yearly follow up will be coordinated by Shawn Perkins, Thoracic Navigator.  Lauren Allen, DNP, AGNP-C [facility] [phone] (work cell) [phone] (office) 07/13/18 10:12 AM   

## 2018-07-14 ENCOUNTER — Encounter: Payer: Self-pay | Admitting: *Deleted

## 2018-09-07 ENCOUNTER — Ambulatory Visit: Payer: 59 | Admitting: Pulmonary Disease

## 2018-09-07 ENCOUNTER — Encounter: Payer: Self-pay | Admitting: Pulmonary Disease

## 2018-09-07 VITALS — BP 154/104 | HR 88 | Ht 62.0 in | Wt 211.8 lb

## 2018-09-07 DIAGNOSIS — R05 Cough: Secondary | ICD-10-CM

## 2018-09-07 DIAGNOSIS — R059 Cough, unspecified: Secondary | ICD-10-CM

## 2018-09-07 DIAGNOSIS — J449 Chronic obstructive pulmonary disease, unspecified: Secondary | ICD-10-CM

## 2018-09-07 DIAGNOSIS — J4489 Other specified chronic obstructive pulmonary disease: Secondary | ICD-10-CM

## 2018-09-07 DIAGNOSIS — Z6838 Body mass index (BMI) 38.0-38.9, adult: Secondary | ICD-10-CM

## 2018-09-07 DIAGNOSIS — F17210 Nicotine dependence, cigarettes, uncomplicated: Secondary | ICD-10-CM | POA: Diagnosis not present

## 2018-09-07 DIAGNOSIS — E6609 Other obesity due to excess calories: Secondary | ICD-10-CM | POA: Diagnosis not present

## 2018-09-07 DIAGNOSIS — R0683 Snoring: Secondary | ICD-10-CM

## 2018-09-07 LAB — SPIROMETRY WITH GRAPH

## 2018-09-07 MED ORDER — SPACER/AERO-HOLDING CHAMBERS DEVI: 1.0000 | Freq: Every day | 0 refills | Status: AC

## 2018-09-07 MED ORDER — BUDESONIDE-FORMOTEROL FUMARATE 160-4.5 MCG/ACT IN AERO: 2.0000 | INHALATION_SPRAY | Freq: Two times a day (BID) | RESPIRATORY_TRACT | 0 refills | Status: DC

## 2018-09-07 NOTE — Patient Instructions (Signed)
1.  We will start to on Symbicort 160/4.5, 2 inhalations twice a day.  You may still use as needed albuterol.  Use a spacer for the Symbicort.  Rinse your mouth well after use.  2.  I will share with your primary care physician that you should be taken off lisinopril and switch to a different medication as you have a propensity for cough.  3.  Recommend that you engage in smoking cessation.  4.  Cut your prednisone tablet in half and take only half a day till gone.  5.  We will schedule you for formal breathing test.  6.  We will see him in follow-up in 2 months time.

## 2018-09-07 NOTE — Progress Notes (Signed)
Patient seen in the office today and instructed on use of Symbicort 160.  Patient expressed understanding and demonstrated technique. 

## 2018-09-07 NOTE — Progress Notes (Signed)
pft  

## 2018-09-07 NOTE — Progress Notes (Signed)
Subjective:    Patient ID: Meagan York, female    DOB: July 06, 1956, 62 y.o.   MRN: [id number]  HPI Patient is a 62 year old current smoker (1-1/2 packs/day) who presents for evaluation of COPD/emphysema.  The patient is kindly referred by Park Liter, DO.  The patient states that she has been told she has COPD and emphysema and would like clarification on this diagnosis.  She states that she has noted issues with breathing and that she gets dyspneic with exertion.  She notes that she feels "soreness" in her lungs off-and-on and some chest tightness.  She has dry cough which aggravates her sensation of dyspnea.  She also notes disrupted sleep wakes up with a headache and feels like she has no energy.  He has been told she snores.  He has not been told of apneic episodes.  Over the last 6 months her dyspnea has been worse.  She calculates that the dyspnea started approximately 2 to 3 years ago.  She also notes that the weather change makes her dyspnea worse.  She states that steroids and oxycodone make her symptoms better.  She has previously been evaluated by Dr. Raul Del but wanted a second opinion.  She is currently only on albuterol as needed and prednisone 10 mg daily for her issues.  She does not describe any orthopnea or paroxysmal nocturnal dyspnea.  No lower extremity edema.  Doesnote note gastroesophageal reflux symptoms are quite severe.  No palpitations.  As noted above cough has been dry.  No sputum production nor hemoptysis.  Past medical history, surgical history and family history have been reviewed.  Social history has been reviewed as well.  She smokes 1-1/2 packs of cigarettes per day and has done so since 1974.  She unfortunately works at the [employer].  She has cats and dogs in the home.  No exotic pets.  She also takes care of her grandchildren in the home 5 in total.  She has not served in the [employer].  She has had no exotic travel.  The patient is  up-to-date with lung cancer screening having had this performed in October 2019.  I have reviewed this film independently and basically the findings are consistent with emphysema and atherosclerotic vascular disease including cardiac disease.  Medications were reviewed and it is noted that the patient is on lisinopril.    Review of Systems  Constitutional: Positive for fatigue. Negative for chills and fever.  HENT: Positive for postnasal drip and sinus pressure.   Eyes: Negative.   Respiratory: Positive for cough, chest tightness and shortness of breath.   Cardiovascular: Negative.   Gastrointestinal:       Gastroesophageal reflux symptoms of heartburn despite Protonix  Endocrine: Negative.   Genitourinary: Negative.   Musculoskeletal: Negative.   Skin: Negative.   Allergic/Immunologic: Positive for environmental allergies. Negative for food allergies.  Neurological: Negative.   Hematological: Negative.   Psychiatric/Behavioral: Negative.   All other systems reviewed and are negative.      Objective:   Physical Exam Vitals signs (Elevated blood pressure noted.) reviewed.  Constitutional:      Appearance: She is well-developed. She is obese. She is not toxic-appearing.  HENT:     Head: Normocephalic and atraumatic.     Mouth/Throat:     Mouth: Mucous membranes are moist.     Pharynx: Oropharynx is clear.  Eyes:     Extraocular Movements: Extraocular movements intact.     Pupils: Pupils are equal, round,  and reactive to light.  Neck:     Musculoskeletal: Neck supple.     Thyroid: No thyromegaly.     Vascular: No JVD.     Trachea: No tracheal deviation.  Cardiovascular:     Rate and Rhythm: Normal rate and regular rhythm.     Pulses: Normal pulses.     Heart sounds: Normal heart sounds.  Pulmonary:     Effort: Pulmonary effort is normal. No respiratory distress.     Breath sounds: Rhonchi (Diffuse) present. No decreased breath sounds, wheezing or rales.  Chest:      Chest wall: No tenderness or crepitus.  Abdominal:     General: Bowel sounds are normal.     Palpations: Abdomen is soft.  Musculoskeletal: Normal range of motion.     Right lower leg: No edema.     Left lower leg: No edema.  Lymphadenopathy:     Cervical: No cervical adenopathy.  Skin:    General: Skin is warm and dry.  Neurological:     General: No focal deficit present.     Mental Status: She is alert and oriented to person, place, and time.  Psychiatric:        Mood and Affect: Mood normal.        Behavior: Behavior normal.    Spirometry was performed today it was nonrevealing however the present spirometry equipment did not calibrate well.      Assessment & Plan:   1. Asthma/COPD overlap syndrome: Suspect that this is the main cause of patient's symptoms she does have emphysema and I suspect that she has an asthma overlap syndrome.  We will give her a trial of Symbicort 160/4.5, 2 inhalations twice a day.  She may still use her as needed albuterol.  She was provided with a spacer for the Symbicort and was also instructed to rinse her mouth well after use.  Because of her issues with cough I recommend that lisinopril be substituted for an ARB.  This may also be done to adjust her antihypertensive regimen which she may need adjustment on given that she was noted to be hypertensive today.  She also has been advised to discontinue smoking.  Currently she is pre-contemplative in this regard. I have recommended that she take her prednisone only 5 mg daily until there are completed.  She has been on these for a short period time.  Formal pulmonary function test has been ordered.  Will be seen in follow-up in this regard in 2 months time.  2.  Cough: Suspect that this is due to the issues noted above.  Management will be as above.  She may also have a component of worsening due to gastroesophageal reflux.  She was instructed of antireflux measures which she was not aware of to include not  going to bed for at least 3 hours after her last meal of the day.  3.  Tobacco dependence due to cigarettes: The patient was counseled with regards to discontinuation of smoking counseling time 3 to 5 minutes.  She is pre-contemplative with regards to discontinuation of smoking.  4.  Obesity with body mass index of 38.7: This issue adds complexity to her management.  5.  Snoring and sleep disordered breathing: The patient may require formal evaluation of potential sleep apnea however need to sort out the issues above first.   We will see the patient in 2 months time in follow-up.  Ultimately she will need to make some significant lifestyle changes.  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  She is reconsidering her options in this regard.

## 2018-09-12 ENCOUNTER — Telehealth: Payer: Self-pay | Admitting: Family Medicine

## 2018-09-12 NOTE — Telephone Encounter (Signed)
Copied from [place] [id number]. Topic: General - Inquiry >> Sep 12, 2018  4:12 PM Vernona Rieger wrote: Reason for CRM: Patient states her Pulmonologist Dr. Patsey Berthold advised her to stop taking lisinopril (PRINIVIL,ZESTRIL) 20 MG tablet because there is something in that causes her to cough. She said that she will be faxing something over to Dr Wynetta Emery about this and wanted to give her a heads up. She said she stopped taking it today.

## 2018-09-14 MED ORDER — LOSARTAN POTASSIUM 50 MG PO TABS: 50.0000 mg | ORAL_TABLET | Freq: Every day | ORAL | 3 refills | Status: DC

## 2018-09-14 NOTE — Telephone Encounter (Signed)
Called patient back. Gave patient Dr. Durenda Age message about her BP and patient verbalized understanding. Scheduled an appointment for 10/19/18 for 1 month f/up.

## 2018-09-14 NOTE — Telephone Encounter (Signed)
She cannot just stop her lisinopril or her blood pressure will go up. I've sent her another medicine to her pharmacy to control her blood pressure- she can start that one when she stops the lisinopril. Please make her an appointment for 1 month to make sure they are working the same.

## 2018-09-14 NOTE — Telephone Encounter (Signed)
Pt calling back. Please call back.  °

## 2018-09-14 NOTE — Telephone Encounter (Signed)
Called and left patient a VM asking for her to please return my call.  

## 2018-10-06 ENCOUNTER — Other Ambulatory Visit: Payer: Self-pay | Admitting: Family Medicine

## 2018-10-14 ENCOUNTER — Emergency Department: Payer: 59

## 2018-10-14 ENCOUNTER — Emergency Department
Admission: EM | Admit: 2018-10-14 | Discharge: 2018-10-15 | Disposition: A | Discharge status: Home / Self Care | Payer: 59 | Attending: Emergency Medicine | Admitting: Emergency Medicine

## 2018-10-14 ENCOUNTER — Other Ambulatory Visit: Payer: Self-pay

## 2018-10-14 DIAGNOSIS — M549 Dorsalgia, unspecified: Secondary | ICD-10-CM | POA: Diagnosis present

## 2018-10-14 DIAGNOSIS — M940 Chondrocostal junction syndrome [Tietze]: Secondary | ICD-10-CM | POA: Diagnosis not present

## 2018-10-14 DIAGNOSIS — J449 Chronic obstructive pulmonary disease, unspecified: Secondary | ICD-10-CM | POA: Insufficient documentation

## 2018-10-14 DIAGNOSIS — Z87891 Personal history of nicotine dependence: Secondary | ICD-10-CM | POA: Insufficient documentation

## 2018-10-14 DIAGNOSIS — Z79899 Other long term (current) drug therapy: Secondary | ICD-10-CM | POA: Diagnosis not present

## 2018-10-14 DIAGNOSIS — Z853 Personal history of malignant neoplasm of breast: Secondary | ICD-10-CM | POA: Diagnosis not present

## 2018-10-14 NOTE — ED Triage Notes (Signed)
Patient reports left mid back pain for 2 days, denies any type of injury.  Reports standing and movement make it worse.

## 2018-10-14 NOTE — ED Notes (Signed)
Right side lower back pain, denies fall/trauma or heavy lifting. States pain began 2 days ago.

## 2018-10-14 NOTE — ED Provider Notes (Signed)
[facility] Emergency Department Provider Note  ____________________________________________  Time seen: Approximately 10:36 PM  I have reviewed the triage vital signs and the nursing notes.   HISTORY  Chief Complaint Back Pain    HPI Meagan York is a 63 y.o. female who presents the emergency department complaining of right-sided mid back pain radiating along the rib cage.  Patient presents the emergency department with a sharp and burning sensation running along the right rib cage.  Patient reports no trauma to the area.  She does have a history of low back pain but states that pain is located more along the chest.  She is able to reproduce the pain with palpation.  No recent illnesses.  No shortness of breath or difficulty breathing.  No fevers or chills.  Patient had a similar episode  3-1/2 months ago and was diagnosed with right rib pain.  No acute findings at that exam.  Patient does have a pulmonologist and is undergoing COPD/emphysema work-up at this time.   Past Medical History:  Diagnosis Date  . Arthritis   . Bilateral breast cysts   . 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 Complication of anesthesia    pt woke up during breast surgery (1995)  . COPD (chronic obstructive pulmonary disease) ([place])   . GERD (gastroesophageal reflux disease)   . Hemorrhoids   . Irritable bowel disease   . 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 Pap smear abnormality of cervix/human papillomavirus (HPV) positive 07/03/2018  . Class 1 obesity due to excess calories with serious comorbidity and body mass index (BMI) of 33.0 to 33.9 in adult 04/20/2018  . 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 Tobacco abuse 11/24/2015  . Heel pain, bilateral 10/24/2015  . Arthritis   . COPD (chronic obstructive pulmonary disease) ([place])   . GERD  (gastroesophageal reflux disease)   . Upper GI bleed 07/05/2015  . History of breast cancer 04/25/2015    Past Surgical History:  Procedure Laterality Date  . APPENDECTOMY    . BREAST BIOPSY Right 01-01-15   BIPHASIC FIBROEPITHELIAL LESION with ADH  . BREAST LUMPECTOMY Right 04/25/2015   Procedure: RIGHT BREAST LUMPECTOMY, exicision of skin tag right face;  Surgeon: Christene Lye, MD;  Location: [facility] ORS;  Service: General;  Laterality: Right;  . BREAST MAMMOSITE Right 05-21-15  . BREAST SURGERY Left    cyst removal  . COLONOSCOPY WITH PROPOFOL N/A 07/07/2015   Procedure: COLONOSCOPY WITH PROPOFOL;  Surgeon: Josefine Class, MD;  Location: [facility] ENDOSCOPY;  Service: Endoscopy;  Laterality: N/A;  . COLONOSCOPY WITH PROPOFOL N/A 06/12/2018   Procedure: COLONOSCOPY WITH PROPOFOL;  Surgeon: Lin Landsman, MD;  Location: [facility] ENDOSCOPY;  Service: Gastroenterology;  Laterality: N/A;  . cyst lower spine    . ESOPHAGOGASTRODUODENOSCOPY (EGD) WITH PROPOFOL N/A 07/07/2015   Procedure: ESOPHAGOGASTRODUODENOSCOPY (EGD) WITH PROPOFOL;  Surgeon: Josefine Class, MD;  Location: [facility] ENDOSCOPY;  Service: Endoscopy;  Laterality: N/A;  . ESOPHAGOGASTRODUODENOSCOPY (EGD) WITH PROPOFOL N/A 06/12/2018   Procedure: ESOPHAGOGASTRODUODENOSCOPY (EGD) WITH PROPOFOL;  Surgeon: Lin Landsman, MD;  Location: [facility] ENDOSCOPY;  Service: Gastroenterology;  Laterality: N/A;  . KNEE SURGERY    . piondial cyst excision  03/1989    Prior to Admission medications   Medication Sig Start Date End Date Taking? Authorizing Provider  albuterol (  PROAIR HFA) 108 (90 Base) MCG/ACT inhaler Inhale 1-2 puffs into the lungs every 4 (four) hours as needed for wheezing or shortness of breath. 06/26/18   Park Liter P, DO  benzonatate (TESSALON) 100 MG capsule  07/10/18   [provider]  budesonide-formoterol (SYMBICORT) 160-4.5 MCG/ACT inhaler Inhale 2 puffs into the lungs 2 (two) times daily. 09/07/18    Tyler Pita, MD  celecoxib (CELEBREX) 200 MG capsule TAKE 1 CAPSULE BY MOUTH EVERY DAY 06/26/18   Park Liter P, DO  Cholecalciferol (VITAMIN D3) 1000 units CAPS Take 1,000 Units by mouth daily.    [provider]  losartan (COZAAR) 50 MG tablet Take 1 tablet (50 mg total) by mouth daily. 09/14/18   Johnson, Megan P, DO  oxyCODONE (ROXICODONE) 5 MG immediate release tablet Take 1 tablet (5 mg total) by mouth every 8 (eight) hours as needed. 07/04/18 07/04/19  Gregor Hams, MD  oxyCODONE-acetaminophen (PERCOCET/ROXICET) 5-325 MG tablet Take 1 tablet by mouth every 6 (six) hours as needed for severe pain. 10/15/18   , Charline Bills, PA-C  pantoprazole (PROTONIX) 40 MG tablet Take 1 tablet (40 mg total) by mouth 2 (two) times daily. 06/26/18   Johnson, Megan P, DO  predniSONE (DELTASONE) 10 MG tablet Take 1 tablet (10 mg total) by mouth daily. 10/15/18   , Charline Bills, PA-C  Spacer/Aero-Holding Chambers DEVI 1 each by Does not apply route daily. 09/07/18   Tyler Pita, MD    Allergies Aspirin and Tramadol  Family History  Problem Relation Age of Onset  . Colon cancer Father        age 25  . Anemia Father   . Diabetes Father   . Hypertension Father   . Diverticulosis Mother   . COPD Mother   . Hypertension Brother   . Alzheimer's disease Paternal Grandfather     Social History Social History   Tobacco Use  . Smoking status: Former Smoker    Packs/day: 1.00    Years: 44.00    Pack years: 44.00    Types: Cigarettes  . Smokeless tobacco: Never Used  Substance Use Topics  . Alcohol use: No    Alcohol/week: 0.0 standard drinks  . Drug use: No     Review of Systems  Constitutional: No fever/chills Eyes: No visual changes. No discharge ENT: No upper respiratory complaints. Cardiovascular: no substernal chest pain. Respiratory: no cough. No SOB. Gastrointestinal: No abdominal pain.  No nausea, no vomiting.  No diarrhea.  No  constipation. Musculoskeletal: Right-sided rib pain Skin: Negative for rash, abrasions, lacerations, ecchymosis. Neurological: Negative for headaches, focal weakness or numbness. 10-point ROS otherwise negative.  ____________________________________________   PHYSICAL EXAM:  VITAL SIGNS: ED Triage Vitals  Enc Vitals Group     BP 10/14/18 1939 (!) 133/92     Pulse Rate 10/14/18 1939 (!) 106     Resp 10/14/18 1939 20     Temp 10/14/18 1939 98.6 F (37 C)     Temp Source 10/14/18 1939 Oral     SpO2 10/14/18 1939 96 %     Weight 10/14/18 1940 200 lb (90.7 kg)     Height 10/14/18 1940 5\' 4"  (1.626 m)     Head Circumference --      Peak Flow --      Pain Score 10/14/18 1939 7     Pain Loc --      Pain Edu? --      Excl. in [place]? --  Constitutional: Alert and oriented. Well appearing and in no acute distress. Eyes: Conjunctivae are normal. PERRL. EOMI. Head: Atraumatic. ENT:      Ears:       Nose: No congestion/rhinnorhea.      Mouth/Throat: Mucous membranes are moist.  Neck: No stridor.  No cervical spine tenderness to palpation. Hematological/Lymphatic/Immunilogical: No cervical lymphadenopathy. Cardiovascular: Normal rate, regular rhythm. Normal S1 and S2.  Good peripheral circulation. Respiratory: Normal respiratory effort without tachypnea or retractions. Lungs CTAB. Good air entry to the bases with no decreased or absent breath sounds. Musculoskeletal: Full range of motion to all extremities. No gross deformities appreciated.  Visualization of the right ribs reveals no significant visible findings.  Patient with equal chest rise and fall.  She is tender to palpations over ribs 10 through 12 in the intercostal margins.  This radiates from the posterior lateral aspect 1 to the anterior ribs.  No palpable abnormality.  No subcutaneous emphysema.  Good underlying breath sounds bilaterally. Neurologic:  Normal speech and language. No gross focal neurologic deficits are  appreciated.  Skin:  Skin is warm, dry and intact. No rash noted. Psychiatric: Mood and affect are normal. Speech and behavior are normal. Patient exhibits appropriate insight and judgement.   ____________________________________________   LABS (all labs ordered are listed, but only abnormal results are displayed)  Labs Reviewed - No data to display ____________________________________________  EKG   ____________________________________________  RADIOLOGY I personally viewed and evaluated these images as part of my medical decision making, as well as reviewing the written report by the radiologist.  I concur with radiologist finding of no acute osseous or cardiopulmonary abnormality on x-ray.  Dg Ribs Unilateral W/chest Right  Result Date: 10/14/2018 CLINICAL DATA:  RIGHT chest and back pain for 2 days. No injury. History of COPD. EXAM: RIGHT RIBS AND CHEST - 3+ VIEW COMPARISON:  Chest radiograph July 03, 2018 FINDINGS: A Cardiomediastinal silhouette is normal. No pleural effusions or focal consolidations. Trachea projects midline and there is no pneumothorax. Mildly elevated RIGHT hemidiaphragm. Soft tissue planes and included osseous structures are non-suspicious. IMPRESSION: Negative. Electronically Signed   By: Elon Alas M.D.   On: 10/14/2018 23:10    ____________________________________________    PROCEDURES  Procedure(s) performed:    Procedures    Medications  predniSONE (DELTASONE) tablet 60 mg (60 mg Oral Given 10/15/18 0013)  oxyCODONE-acetaminophen (PERCOCET/ROXICET) 5-325 MG per tablet 1 tablet (1 tablet Oral Given 10/15/18 0013)     ____________________________________________   INITIAL IMPRESSION / ASSESSMENT AND PLAN / ED COURSE  Pertinent labs & imaging results that were available during my care of the patient were reviewed by me and considered in my medical decision making (see chart for details).  Review of the [place] CSRS was performed in  accordance of the [place] prior to dispensing any controlled drugs.      Patient's diagnosis is consistent with right rib pain/costochondritis.  Patient presents the emergency department with a complaint of right rib pain ongoing times several days.  Overall, exam was reassuring with no significant or acute findings.  Chest x-ray reveals no significant findings.  Patient will be placed on prednisone taper, limited amount of pain medication for symptom improvement.  She is to follow-up with pulmonologist or primary care if she continues to have symptoms.. Patient is given ED precautions to return to the ED for any worsening or new symptoms.     ____________________________________________  FINAL CLINICAL IMPRESSION(S) / ED DIAGNOSES  Final diagnoses:  Costochondritis  NEW MEDICATIONS STARTED DURING THIS VISIT:  ED Discharge Orders         Ordered    predniSONE (DELTASONE) 10 MG tablet  Daily    Note to Pharmacy:  Take 6 pills x 2 days, 5 pills x 2 days, 4 pills x 2 days, 3 pills x 2 days, 2 pills x 2 days, and 1 pill x 2 days   10/15/18 0007    oxyCODONE-acetaminophen (PERCOCET/ROXICET) 5-325 MG tablet  Every 6 hours PRN     10/15/18 0007              This chart was dictated using voice recognition software/Dragon. Despite best efforts to proofread, errors can occur which can change the meaning. Any change was purely unintentional.    Darletta Moll, PA-C 10/15/18 0050    Earleen Newport, MD 10/17/18 [phone]

## 2018-10-14 NOTE — ED Notes (Signed)
Returned from xray

## 2018-10-14 NOTE — ED Notes (Signed)
Patient off unit to to xray.

## 2018-10-15 MED ORDER — PREDNISONE 20 MG PO TABS
60.0000 mg | ORAL_TABLET | Freq: Once | ORAL | Status: AC
  Administered 2018-10-15: 60 mg via ORAL
  Filled 2018-10-15: qty 3

## 2018-10-15 MED ORDER — PREDNISONE 10 MG PO TABS: 10.0000 mg | ORAL_TABLET | Freq: Every day | ORAL | 0 refills | Status: DC

## 2018-10-15 MED ORDER — OXYCODONE-ACETAMINOPHEN 5-325 MG PO TABS: 1.0000 | ORAL_TABLET | Freq: Four times a day (QID) | ORAL | 0 refills | Status: DC | PRN

## 2018-10-15 MED ORDER — OXYCODONE-ACETAMINOPHEN 5-325 MG PO TABS
1.0000 | ORAL_TABLET | Freq: Once | ORAL | Status: AC
  Administered 2018-10-15: 1 via ORAL
  Filled 2018-10-15: qty 1

## 2018-10-18 NOTE — Progress Notes (Deleted)
   There were no vitals taken for this visit.   Subjective:    Patient ID: Meagan York, female    DOB: 12/23/1955, 63 y.o.   MRN: [id number]  HPI: Meagan York is a 63 y.o. female  No chief complaint on file.  HYPERTENSION Hypertension status: {Blank single:19197::"controlled","uncontrolled","better","worse","exacerbated","stable"}  Satisfied with current treatment? {Blank single:19197::"yes","no"} Duration of hypertension: {Blank single:19197::"chronic","months","years"} BP monitoring frequency:  {Blank single:19197::"not checking","rarely","daily","weekly","monthly","a few times a day","a few times a week","a few times a month"} BP range:  BP medication side effects:  {Blank single:19197::"yes","no"} Medication compliance: {Blank single:19197::"excellent compliance","good compliance","fair compliance","poor compliance"} Previous BP meds:{Blank RRNHAFBX:03833::"XOVA","NVBTYOMAYO","KHTXHFSFSE/LTRVUYEBXI","DHWYSHUO","HFGBMSXJDB","ZMCEYEMVVK/PQAE","SLPNPYYFRT (bystolic)","carvedilol","chlorthalidone","clonidine","diltiazem","exforge HCT","HCTZ","irbesartan (avapro)","labetalol","lisinopril","lisinopril-HCTZ","losartan (cozaar)","methyldopa","nifedipine","olmesartan (benicar)","olmesartan-HCTZ","quinapril","ramipril","spironalactone","tekturna","valsartan","valsartan-HCTZ","verapamil"} Aspirin: {Blank single:19197::"yes","no"} Recurrent headaches: {Blank single:19197::"yes","no"} Visual changes: {Blank single:19197::"yes","no"} Palpitations: {Blank single:19197::"yes","no"} Dyspnea: {Blank single:19197::"yes","no"} Chest pain: {Blank single:19197::"yes","no"} Lower extremity edema: {Blank single:19197::"yes","no"} Dizzy/lightheaded: {Blank single:19197::"yes","no"}  Relevant past medical, surgical, family and social history reviewed and updated as indicated. Interim medical history since our last visit reviewed. Allergies and medications reviewed and updated.  Review of  Systems  Per HPI unless specifically indicated above     Objective:    There were no vitals taken for this visit.  Wt Readings from Last 3 Encounters:  10/14/18 200 lb (90.7 kg)  09/07/18 211 lb 12.8 oz (96.1 kg)  07/13/18 201 lb (91.2 kg)    Physical Exam  Results for orders placed or performed in visit on 09/07/18  Spirometry with Graph  Result Value Ref Range   VCAM-1 Result        Assessment & Plan:   Problem List Items Addressed This Visit      Genitourinary   Benign hypertensive renal disease - Primary       Follow up plan: No follow-ups on file.

## 2018-10-19 ENCOUNTER — Ambulatory Visit: Payer: Self-pay | Admitting: Family Medicine

## 2018-10-26 ENCOUNTER — Ambulatory Visit: Payer: 59 | Attending: Pulmonary Disease

## 2018-10-26 DIAGNOSIS — R059 Cough, unspecified: Secondary | ICD-10-CM

## 2018-10-26 DIAGNOSIS — R05 Cough: Secondary | ICD-10-CM | POA: Diagnosis present

## 2018-10-26 MED ORDER — ALBUTEROL SULFATE (2.5 MG/3ML) 0.083% IN NEBU
2.5000 mg | INHALATION_SOLUTION | Freq: Once | RESPIRATORY_TRACT | Status: AC
  Administered 2018-10-26: 2.5 mg via RESPIRATORY_TRACT
  Filled 2018-10-26: qty 3

## 2018-11-09 ENCOUNTER — Ambulatory Visit: Payer: 59 | Admitting: Pulmonary Disease

## 2018-11-09 ENCOUNTER — Encounter: Payer: Self-pay | Admitting: Pulmonary Disease

## 2018-11-09 VITALS — BP 128/80 | HR 89 | Ht 64.0 in | Wt 201.6 lb

## 2018-11-09 DIAGNOSIS — R05 Cough: Secondary | ICD-10-CM

## 2018-11-09 DIAGNOSIS — M546 Pain in thoracic spine: Secondary | ICD-10-CM

## 2018-11-09 DIAGNOSIS — R059 Cough, unspecified: Secondary | ICD-10-CM

## 2018-11-09 DIAGNOSIS — F17210 Nicotine dependence, cigarettes, uncomplicated: Secondary | ICD-10-CM | POA: Diagnosis not present

## 2018-11-09 DIAGNOSIS — Z6838 Body mass index (BMI) 38.0-38.9, adult: Secondary | ICD-10-CM

## 2018-11-09 DIAGNOSIS — J449 Chronic obstructive pulmonary disease, unspecified: Secondary | ICD-10-CM

## 2018-11-09 DIAGNOSIS — E6609 Other obesity due to excess calories: Secondary | ICD-10-CM

## 2018-11-09 MED ORDER — BUDESONIDE-FORMOTEROL FUMARATE 160-4.5 MCG/ACT IN AERO: 2.0000 | INHALATION_SPRAY | Freq: Two times a day (BID) | RESPIRATORY_TRACT | 0 refills | Status: DC

## 2018-11-09 MED ORDER — BUDESONIDE-FORMOTEROL FUMARATE 160-4.5 MCG/ACT IN AERO: 2.0000 | INHALATION_SPRAY | Freq: Two times a day (BID) | RESPIRATORY_TRACT | 11 refills | Status: DC

## 2018-11-09 NOTE — Patient Instructions (Addendum)
1.  Resume Symbicort 160/4.5, 2 inhalations twice a day.  Use your spacer for the Symbicort and remember to rinse your mouth well after use.  We will give you a sample and a prescription was sent to your pharmacy of choice.  2.  With regards to your back pain please make an appointment with your primary care physician as soon as possible.  This is not coming from your lung.  A recent chest x-ray of your chest and ribs did not show any issues with the lung.  Suspect that this is due to back issues.  We have ordered a thoracic spine (back) films so that these will be available for your physician to evaluate you.  3.  We will see you in follow-up in 3 months time please contact us prior to that time should any new issues arise.

## 2018-11-09 NOTE — Progress Notes (Signed)
Subjective:    Patient ID: Meagan York, female    DOB: 1956-08-22, 63 y.o.   MRN: [id number]  HPI This is a 63 year old current smoker (1 PPD) who presents for follow-up of COPD/emphysema.  Patient was first evaluated here on 07 September 2018.  Since that visit she has had a pulmonary function test performed on 13 February that shows mild obstruction with concomitant pseudo-restriction due to obesity.  The patient did have bronchodilator response.  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  The patient states that Symbicort, which we prescribed during her initial visit, did help her symptoms.  She was supposed to call us if this was the case so we could send a prescription in for her however she never did this.  She would like a refill on this medication.  Patient also notes that she was having issues with cough but since she discontinued Anoro this has helped.  I suspect that the powdered preparation of Anoro was worsening her cough.  She has not had any fevers, chills or sweats and is otherwise doing well.  Her lung cancer screening CT will be due in October.  Today her only new complaint is that of a right-sided back and chest pain.  She states that she thinks it is from her "lungs".  She does not have a pleuritic component to this her pain is worse when she stands up.  I advised her that this does not appear to be of pulmonary origin.  She is requesting narcotics for this pain, she is in no distress associated with this.  She is currently not on p.o. steroids.   Review of Systems  Constitutional: Negative.   HENT: Negative.   Eyes: Negative.   Respiratory: Positive for shortness of breath.   Cardiovascular: Negative.   Gastrointestinal: Negative.   Genitourinary: Negative.   Musculoskeletal: Positive for back pain (Radiates to right side).  Allergic/Immunologic: Negative.   Neurological: Negative.   All other systems reviewed and are negative.        Objective:   Physical Exam Vitals signs and nursing note reviewed.  Constitutional:      General: She is not in acute distress.    Appearance: She is well-developed. She is obese. She is not ill-appearing.  HENT:     Head: Normocephalic and atraumatic.     Mouth/Throat:     Mouth: Mucous membranes are moist.     Pharynx: Oropharynx is clear.  Eyes:     Extraocular Movements: Extraocular movements intact.     Pupils: Pupils are equal, round, and reactive to light.  Neck:     Musculoskeletal: Neck supple.     Thyroid: No thyromegaly.     Vascular: No JVD.     Trachea: No tracheal deviation.  Cardiovascular:     Rate and Rhythm: Normal rate and regular rhythm.     Pulses: Normal pulses.     Heart sounds: Normal heart sounds.  Pulmonary:     Effort: Pulmonary effort is normal.     Breath sounds: Normal breath sounds.  Abdominal:     General: Abdomen is protuberant.  Musculoskeletal: Normal range of motion.     Right shoulder: She exhibits bony tenderness.       Arms:     Right lower leg: No edema.     Left lower leg: No edema.     Comments: R paraspinal tenderness  Lymphadenopathy:     Cervical: No cervical adenopathy.  Skin:  General: Skin is warm and dry.  Neurological:     General: No focal deficit present.     Mental Status: She is alert and oriented to person, place, and time.  Psychiatric:        Mood and Affect: Mood normal.        Behavior: Behavior normal.           Assessment & Plan:   1. Asthma/COPD overlap syndrome:  We will reinstitute Symbicort as she appears to do well with this medication.  She has a spacer to use with the medication.  She may still use her as needed albuterol. Will be seen in follow-up in this regard in 3 months time.  2.  Cough:  This has resolved with the discontinuation of Anoro.  She is not on an ACE inhibitor.  3.  Tobacco dependence due to cigarettes: The patient was counseled with regards to discontinuation of smoking  counseling time 3 to 5 minutes.  She is pre-contemplative with regards to discontinuation of smoking.  4.  Obesity with body mass index of 34.6: This issue adds complexity to her management.  5.  Back pain radiating to the chest: She has significant paraspinal tenderness: Query vertebral pression fracture.  Will obtain T-spine series.  Recommended that she make an appointment with her primary care physician for further evaluation of this issue.  X-ray should be available for her primary care physician to review.    This chart was dictated using voice recognition software/Dragon. Despite best efforts to proofread, errors can occur which can change the meaning. Any change was purely unintentional.

## 2018-11-19 ENCOUNTER — Other Ambulatory Visit: Payer: Self-pay

## 2018-11-19 ENCOUNTER — Emergency Department
Admission: EM | Admit: 2018-11-19 | Discharge: 2018-11-19 | Disposition: A | Discharge status: Home / Self Care | Payer: 59 | Attending: Student in an Organized Health Care Education/Training Program | Admitting: Student in an Organized Health Care Education/Training Program

## 2018-11-19 ENCOUNTER — Encounter: Payer: Self-pay | Admitting: *Deleted

## 2018-11-19 DIAGNOSIS — J449 Chronic obstructive pulmonary disease, unspecified: Secondary | ICD-10-CM | POA: Diagnosis not present

## 2018-11-19 DIAGNOSIS — F17210 Nicotine dependence, cigarettes, uncomplicated: Secondary | ICD-10-CM | POA: Insufficient documentation

## 2018-11-19 DIAGNOSIS — Z79899 Other long term (current) drug therapy: Secondary | ICD-10-CM | POA: Insufficient documentation

## 2018-11-19 DIAGNOSIS — Z853 Personal history of malignant neoplasm of breast: Secondary | ICD-10-CM | POA: Diagnosis not present

## 2018-11-19 DIAGNOSIS — J069 Acute upper respiratory infection, unspecified: Secondary | ICD-10-CM

## 2018-11-19 DIAGNOSIS — R05 Cough: Secondary | ICD-10-CM | POA: Diagnosis present

## 2018-11-19 MED ORDER — PROMETHAZINE-CODEINE 6.25-10 MG/5ML PO SYRP: 5.0000 mL | ORAL_SOLUTION | Freq: Four times a day (QID) | ORAL | 0 refills | Status: DC | PRN

## 2018-11-19 MED ORDER — ONDANSETRON 4 MG PO TBDP
4.0000 mg | ORAL_TABLET | Freq: Once | ORAL | Status: AC
  Administered 2018-11-19: 4 mg via ORAL
  Filled 2018-11-19: qty 1

## 2018-11-19 MED ORDER — DEXAMETHASONE SODIUM PHOSPHATE 10 MG/ML IJ SOLN
10.0000 mg | Freq: Once | INTRAMUSCULAR | Status: AC
  Administered 2018-11-19: 10 mg via INTRAMUSCULAR
  Filled 2018-11-19: qty 1

## 2018-11-19 NOTE — Discharge Instructions (Signed)
Follow up with the primary care provider for symptoms that are not improving over the next few days.  Return to the ER for symptoms that change or worsen if unable to schedule an appointment.

## 2018-11-19 NOTE — ED Provider Notes (Signed)
[facility] Emergency Department Provider Note  ____________________________________________  Time seen: Approximately 2:20 PM  I have reviewed the triage vital signs and the nursing notes.   HISTORY  Chief Complaint Cough   HPI Meagan York is a 62 y.o. female who presents to the emergency department for treatment and evaluation of cough, headache, nausea without vomiting, and diarrhea. Symptoms started about 4 days ago. Other people in the house with similar symptoms. She has a history of COPD. No relief with Albuterol, symbicort and OTC cough and cold medications. No known fever.    Past Medical History:  Diagnosis Date  . Arthritis   . Bilateral breast cysts   . 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 Complication of anesthesia    pt woke up during breast surgery (1995)  . COPD (chronic obstructive pulmonary disease) ([place])   . GERD (gastroesophageal reflux disease)   . Hemorrhoids   . Irritable bowel disease   . 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 Pap smear abnormality of cervix/human papillomavirus (HPV) positive 07/03/2018  . Class 1 obesity due to excess calories with serious comorbidity and body mass index (BMI) of 33.0 to 33.9 in adult 04/20/2018  . 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 Tobacco abuse 11/24/2015  . Heel pain, bilateral 10/24/2015  . Arthritis   . COPD (chronic obstructive pulmonary disease) ([place])   . GERD (gastroesophageal reflux disease)   . Upper GI bleed 07/05/2015  . History of breast cancer 04/25/2015    Past Surgical History:  Procedure Laterality Date  . APPENDECTOMY    . BREAST BIOPSY Right 01-01-15   BIPHASIC FIBROEPITHELIAL LESION with ADH  . BREAST LUMPECTOMY Right 04/25/2015   Procedure: RIGHT BREAST LUMPECTOMY, exicision of skin tag right face;  Surgeon:  Christene Lye, MD;  Location: [facility] ORS;  Service: General;  Laterality: Right;  . BREAST MAMMOSITE Right 05-21-15  . BREAST SURGERY Left    cyst removal  . COLONOSCOPY WITH PROPOFOL N/A 07/07/2015   Procedure: COLONOSCOPY WITH PROPOFOL;  Surgeon: Josefine Class, MD;  Location[facility] ENDOSCOPY;  Service: Endoscopy;  Laterality: N/A;  . COLONOSCOPY WITH PROPOFOL N/A 06/12/2018   Procedure: COLONOSCOPY WITH PROPOFOL;  Surgeon: Lin Landsman, MD;  Location: [facility] ENDOSCOPY;  Service: Gastroenterology;  Laterality: N/A;  . cyst lower spine    . ESOPHAGOGASTRODUODENOSCOPY (EGD) WITH PROPOFOL N/A 07/07/2015   Procedure: ESOPHAGOGASTRODUODENOSCOPY (EGD) WITH PROPOFOL;  Surgeon: Josefine Class, MD;  Location: [facility] ENDOSCOPY;  Service: Endoscopy;  Laterality: N/A;  . ESOPHAGOGASTRODUODENOSCOPY (EGD) WITH PROPOFOL N/A 06/12/2018   Procedure: ESOPHAGOGASTRODUODENOSCOPY (EGD) WITH PROPOFOL;  Surgeon: Lin Landsman, MD;  Location: [facility] ENDOSCOPY;  Service: Gastroenterology;  Laterality: N/A;  . KNEE SURGERY    . piondial cyst excision  03/1989    Prior to Admission medications   Medication Sig Start Date End Date Taking? Authorizing Provider  albuterol (PROAIR HFA) 108 (90 Base) MCG/ACT inhaler Inhale 1-2 puffs into the lungs every 4 (four) hours as needed for wheezing or shortness of breath. 06/26/18   Johnson, Megan P, DO  budesonide-formoterol (SYMBICORT) 160-4.5 MCG/ACT inhaler Inhale 2 puffs into the lungs 2 (two) times daily. 11/09/18   Tyler Pita, MD  budesonide-formoterol [facility]) 160-4.5 MCG/ACT inhaler Inhale 2 puffs into the lungs 2 (two) times daily. 11/09/18   Vernard Gambles  L, MD  celecoxib (CELEBREX) 200 MG capsule TAKE 1 CAPSULE BY MOUTH EVERY DAY 06/26/18   Johnson, Megan P, DO  Cholecalciferol (VITAMIN D3) 1000 units CAPS Take 1,000 Units by mouth daily.    [provider]  losartan (COZAAR) 50 MG tablet Take 1 tablet (50 mg total) by mouth  daily. 09/14/18   Johnson, Megan P, DO  pantoprazole (PROTONIX) 40 MG tablet Take 1 tablet (40 mg total) by mouth 2 (two) times daily. 06/26/18   Johnson, Megan P, DO  promethazine-codeine (PHENERGAN WITH CODEINE) 6.25-10 MG/5ML syrup Take 5 mLs by mouth every 6 (six) hours as needed for cough. 11/19/18   Victorino Dike, FNP  Spacer/Aero-Holding Josiah Lobo DEVI 1 each by Does not apply route daily. 09/07/18   Tyler Pita, MD    Allergies Aspirin and Tramadol  Family History  Problem Relation Age of Onset  . Colon cancer Father        age 19  . Anemia Father   . Diabetes Father   . Hypertension Father   . Diverticulosis Mother   . COPD Mother   . Hypertension Brother   . Alzheimer's disease Paternal Grandfather     Social History Social History   Tobacco Use  . Smoking status: Current Every Day Smoker    Packs/day: 1.00    Years: 44.00    Pack years: 44.00    Types: Cigarettes  . Smokeless tobacco: Never Used  . Tobacco comment: about 1/2 ppd  Substance Use Topics  . Alcohol use: No    Alcohol/week: 0.0 standard drinks  . Drug use: No    Review of Systems Constitutional: Negative for fever/chills. Normal appetite. ENT: Negative for sore throat. Cardiovascular: Denies chest pain. Respiratory: Positive shortness of breath. Positive for cough. Negative for wheezing.  Gastrointestinal: Positive for nausea,  no vomiting.  Positive for diarrhea.  Musculoskeletal: Positive for body aches Skin: Negative for rash. Neurological: Positive for headaches ____________________________________________   PHYSICAL EXAM:  VITAL SIGNS: ED Triage Vitals  Enc Vitals Group     BP 11/19/18 1324 (!) 159/91     Pulse Rate 11/19/18 1324 96     Resp 11/19/18 1324 (!) 24     Temp 11/19/18 1324 99.4 F (37.4 C)     Temp Source 11/19/18 1324 Oral     SpO2 11/19/18 1324 95 %     Weight 11/19/18 1325 200 lb (90.7 kg)     Height 11/19/18 1325 5\' 4"  (1.626 m)     Head Circumference --       Peak Flow --      Pain Score 11/19/18 1327 7     Pain Loc --      Pain Edu? --      Excl. in [place]? --     Constitutional: Alert and oriented. Well appearing and in no acute distress. Eyes: Conjunctivae are normal. Ears: Bilateral TM normal Nose: No sinus congestion noted; no rhinnorhea. Mouth/Throat: Mucous membranes are moist.  Oropharynx mildly erythematous. Tonsils not visualized. Uvula midline. Neck: No stridor.  Lymphatic: No cervical lymphadenopathy. Cardiovascular: Normal rate, regular rhythm. Good peripheral circulation. Respiratory: Respirations are even and unlabored.  No retractions. Breath sounds diminished, but clear throughout. Gastrointestinal: Soft and nontender.  Musculoskeletal: FROM x 4 extremities.  Neurologic:  Normal speech and language. Skin:  Skin is warm, dry and intact. No rash noted. Psychiatric: Mood and affect are normal. Speech and behavior are normal.  ____________________________________________   LABS (all labs ordered  are listed, but only abnormal results are displayed)  Labs Reviewed - No data to display ____________________________________________  EKG  Not indicated. ____________________________________________  RADIOLOGY  Not indicated. ____________________________________________   PROCEDURES  Procedure(s) performed: None  Critical Care performed: No ____________________________________________   INITIAL IMPRESSION / ASSESSMENT AND PLAN / ED COURSE  63 y.o. female who presents to the emergency department for treatment and evaluation of URI symptoms. Exam is reassuring. She will be given a Decadron injection and zofran while here. She will be prescribed Phenergan with Codeine. She is to follow up with her PCP if not improving over the next few days. She is to return to the ER for symptoms that change or worsen if unable to schedule and appointment.    Medications  dexamethasone (DECADRON) injection 10 mg (has no  administration in time range)  ondansetron (ZOFRAN-ODT) disintegrating tablet 4 mg (has no administration in time range)    ED Discharge Orders         Ordered    promethazine-codeine (PHENERGAN WITH CODEINE) 6.25-10 MG/5ML syrup  Every 6 hours PRN     11/19/18 1428           Pertinent labs & imaging results that were available during my care of the patient were reviewed by me and considered in my medical decision making (see chart for details).    If controlled substance prescribed during this visit, 12 month history viewed on the [place] prior to issuing an initial prescription for Schedule II or III opiod. ____________________________________________   FINAL CLINICAL IMPRESSION(S) / ED DIAGNOSES  Final diagnoses:  Acute upper respiratory infection    Note:  This document was prepared using Dragon voice recognition software and may include unintentional dictation errors.    Victorino Dike, FNP 11/19/18 1432    Merlyn Lot, MD 11/19/18 [phone]

## 2018-11-19 NOTE — ED Triage Notes (Signed)
Patient c/o cough and headache for 4 days.

## 2018-11-23 ENCOUNTER — Other Ambulatory Visit: Payer: Self-pay | Admitting: Family Medicine

## 2018-12-26 ENCOUNTER — Ambulatory Visit: Payer: 59 | Admitting: Family Medicine

## 2019-01-18 ENCOUNTER — Other Ambulatory Visit: Payer: Self-pay

## 2019-01-18 ENCOUNTER — Encounter: Payer: Self-pay | Admitting: Family Medicine

## 2019-01-18 ENCOUNTER — Telehealth (INDEPENDENT_AMBULATORY_CARE_PROVIDER_SITE_OTHER): Payer: 59 | Admitting: Family Medicine

## 2019-01-18 DIAGNOSIS — J449 Chronic obstructive pulmonary disease, unspecified: Secondary | ICD-10-CM | POA: Diagnosis not present

## 2019-01-18 DIAGNOSIS — I129 Hypertensive chronic kidney disease with stage 1 through stage 4 chronic kidney disease, or unspecified chronic kidney disease: Secondary | ICD-10-CM | POA: Diagnosis not present

## 2019-01-18 DIAGNOSIS — G4700 Insomnia, unspecified: Secondary | ICD-10-CM | POA: Diagnosis not present

## 2019-01-18 MED ORDER — CELECOXIB 200 MG PO CAPS: ORAL_CAPSULE | ORAL | 1 refills | Status: DC

## 2019-01-18 MED ORDER — TRAZODONE HCL 50 MG PO TABS: 25.0000 mg | ORAL_TABLET | Freq: Every evening | ORAL | 3 refills | Status: DC | PRN

## 2019-01-18 MED ORDER — BUDESONIDE-FORMOTEROL FUMARATE 160-4.5 MCG/ACT IN AERO: 2.0000 | INHALATION_SPRAY | Freq: Two times a day (BID) | RESPIRATORY_TRACT | 11 refills | Status: DC

## 2019-01-18 MED ORDER — TELMISARTAN 40 MG PO TABS: 40.0000 mg | ORAL_TABLET | Freq: Every day | ORAL | 1 refills | Status: DC

## 2019-01-18 MED ORDER — PANTOPRAZOLE SODIUM 40 MG PO TBEC: 40.0000 mg | DELAYED_RELEASE_TABLET | Freq: Two times a day (BID) | ORAL | 1 refills | Status: DC

## 2019-01-18 MED ORDER — ALBUTEROL SULFATE HFA 108 (90 BASE) MCG/ACT IN AERS: 1.0000 | INHALATION_SPRAY | RESPIRATORY_TRACT | 6 refills | Status: DC | PRN

## 2019-01-18 NOTE — Progress Notes (Signed)
There were no vitals taken for this visit.   Subjective:    Patient ID: Meagan York, female    DOB: 09-14-1955, 63 y.o.   MRN: [id number]  HPI: Meagan York is a 63 y.o. female  Chief Complaint  Patient presents with  . Follow-up  . COPD  . Hypertension  . Insomnia    Tried OTC. Has problems falling and staying asleep.    HYPERTENSION Hypertension status: stable  Satisfied with current treatment? yes Duration of hypertension: chronic BP monitoring frequency:  not checking BP medication side effects:  no Medication compliance: excellent compliance Previous BP meds: telmisartan Aspirin: no Recurrent headaches: no Visual changes: no Palpitations: no Dyspnea: no Chest pain: no Lower extremity edema: no Dizzy/lightheaded: no  COPD COPD status: stable Satisfied with current treatment?: yes Oxygen use: no Dyspnea frequency: rarely Cough frequency: rarely Rescue inhaler frequency: rarely Limitation of activity: no Productive cough: rarely Pneumovax: Up to Date Influenza: Up to Date  INSOMNIA Duration: about a year- getting a lot worse Satisfied with sleep quality: no Difficulty falling asleep: yes Difficulty staying asleep: yes Waking a few hours after sleep onset: no Early morning awakenings: no Daytime hypersomnolence: no Wakes feeling refreshed: no Good sleep hygiene: no Apnea: no Snoring: no Depressed/anxious mood: no Recent stress: no Restless legs/nocturnal leg cramps: no Chronic pain/arthritis: no History of sleep study: no Treatments attempted: melatonin, uinsom and benadryl    Relevant past medical, surgical, family and social history reviewed and updated as indicated. Interim medical history since our last visit reviewed. Allergies and medications reviewed and updated.  Review of Systems  Constitutional: Negative.   Respiratory: Negative.   Cardiovascular: Negative.   Musculoskeletal: Negative.   Skin: Negative.   Neurological:  Negative.   Psychiatric/Behavioral: Positive for sleep disturbance. Negative for agitation, behavioral problems, confusion, decreased concentration, dysphoric mood, hallucinations, self-injury and suicidal ideas. The patient is not nervous/anxious and is not hyperactive.     Per HPI unless specifically indicated above     Objective:    There were no vitals taken for this visit.  Wt Readings from Last 3 Encounters:  11/19/18 200 lb (90.7 kg)  11/09/18 201 lb 9.6 oz (91.4 kg)  10/14/18 200 lb (90.7 kg)    Physical Exam Vitals signs and nursing note reviewed.  Constitutional:      General: She is not in acute distress.    Appearance: Normal appearance. She is not ill-appearing, toxic-appearing or diaphoretic.  HENT:     Head: Normocephalic and atraumatic.     Right Ear: External ear normal.     Left Ear: External ear normal.     Nose: Nose normal.     Mouth/Throat:     Mouth: Mucous membranes are moist.     Pharynx: Oropharynx is clear.  Eyes:     General: No scleral icterus.       Right eye: No discharge.        Left eye: No discharge.     Conjunctiva/sclera: Conjunctivae normal.     Pupils: Pupils are equal, round, and reactive to light.  Neck:     Musculoskeletal: Normal range of motion.  Pulmonary:     Effort: Pulmonary effort is normal. No respiratory distress.     Comments: Speaking in full sentences Musculoskeletal: Normal range of motion.  Skin:    Coloration: Skin is not jaundiced or pale.     Findings: No bruising, erythema, lesion or rash.  Neurological:     Mental  Status: She is alert and oriented to person, place, and time. Mental status is at baseline.  Psychiatric:        Mood and Affect: Mood normal.        Behavior: Behavior normal.        Thought Content: Thought content normal.        Judgment: Judgment normal.     Results for orders placed or performed in visit on 09/07/18  Spirometry with Graph  Result Value Ref Range   VCAM-1 Result         Assessment & Plan:   Problem List Items Addressed This Visit      Respiratory   COPD (chronic obstructive pulmonary disease) ([place])    Under good control on current regimen. Continue current regimen. Continue to monitor. Call with any concerns. Refills given.        Relevant Medications   albuterol (PROAIR HFA) 108 (90 Base) MCG/ACT inhaler   budesonide-formoterol (SYMBICORT) 160-4.5 MCG/ACT inhaler     Genitourinary   Benign hypertensive renal disease - Primary    Under good control on current regimen. Continue current regimen. Continue to monitor. Call with any concerns. Refills given. Will get labs drawn and will get her in for BP check.        Relevant Orders   Basic metabolic panel    Other Visit Diagnoses    Insomnia, unspecified type       Will try trazodone. Call if not getting better or getting worse.        Follow up plan: Return in about 6 months (around 07/21/2019) for Physical.    . This visit was completed via mychart visit due to the restrictions of the COVID-19 pandemic. All issues as above were discussed and addressed. Physical exam was done as above through visual confirmation on mychart visit. If it was felt that the patient should be evaluated in the office, they were directed there. The patient verbally consented to this visit. . Location of the patient: home . Location of the provider: home . Those involved with this call:  . Provider: Park Liter, DO . CMA: Gerda Diss, CMA . Front Desk/Registration: Don Perking  . Time spent on call: 25 minutes with patient face to face via video conference. More than 50% of this time was spent in counseling and coordination of care. 40 minutes total spent in review of patient's record and preparation of their chart.

## 2019-01-18 NOTE — Assessment & Plan Note (Signed)
Under good control on current regimen. Continue current regimen. Continue to monitor. Call with any concerns. Refills given.   

## 2019-01-18 NOTE — Assessment & Plan Note (Signed)
Under good control on current regimen. Continue current regimen. Continue to monitor. Call with any concerns. Refills given. Will get labs drawn and will get her in for BP check.

## 2019-01-23 ENCOUNTER — Other Ambulatory Visit: Payer: Self-pay

## 2019-01-23 ENCOUNTER — Telehealth: Payer: Self-pay | Admitting: Family Medicine

## 2019-01-23 ENCOUNTER — Other Ambulatory Visit: Payer: 59

## 2019-01-23 DIAGNOSIS — I129 Hypertensive chronic kidney disease with stage 1 through stage 4 chronic kidney disease, or unspecified chronic kidney disease: Secondary | ICD-10-CM

## 2019-01-23 NOTE — Telephone Encounter (Signed)
Copied from [place] [id number]. Topic: General - Other >> Jan 23, 2019 12:42 PM Keene Breath wrote: Reason for CRM: Patient called to request that the doctor send in prescription for something for her sinuses.  She stated that she has tried OTC medication that is not working.  Patient was just in for labs and did not get a chance to speak with the doctor.  Please advise and call patient if she can get some medication.  CB# [phone]

## 2019-01-23 NOTE — Telephone Encounter (Signed)
Scheduled appt.

## 2019-01-23 NOTE — Telephone Encounter (Signed)
Please schedule virtual visit

## 2019-01-24 LAB — BASIC METABOLIC PANEL
BUN/Creatinine Ratio: 11 — ABNORMAL LOW (ref 12–28)
BUN: 6 mg/dL — ABNORMAL LOW (ref 8–27)
CO2: 26 mmol/L (ref 20–29)
Calcium: 9 mg/dL (ref 8.7–10.3)
Chloride: 102 mmol/L (ref 96–106)
Creatinine, Ser: 0.56 mg/dL — ABNORMAL LOW (ref 0.57–1.00)
GFR calc Af Amer: 116 mL/min/{1.73_m2} (ref >59)
GFR calc non Af Amer: 101 mL/min/{1.73_m2} (ref >59)
Glucose: 103 mg/dL — ABNORMAL HIGH (ref 65–99)
Potassium: 3.6 mmol/L (ref 3.5–5.2)
Sodium: 141 mmol/L (ref 134–144)

## 2019-01-25 ENCOUNTER — Encounter: Payer: Self-pay | Admitting: Family Medicine

## 2019-01-25 ENCOUNTER — Other Ambulatory Visit: Payer: Self-pay

## 2019-01-25 ENCOUNTER — Telehealth (INDEPENDENT_AMBULATORY_CARE_PROVIDER_SITE_OTHER): Payer: 59 | Admitting: Family Medicine

## 2019-01-25 DIAGNOSIS — G43809 Other migraine, not intractable, without status migrainosus: Secondary | ICD-10-CM | POA: Diagnosis not present

## 2019-01-25 MED ORDER — KETOROLAC TROMETHAMINE 60 MG/2ML IM SOLN
60.0000 mg | Freq: Once | INTRAMUSCULAR | Status: AC
  Administered 2019-01-25: 60 mg via INTRAMUSCULAR

## 2019-01-25 MED ORDER — PROMETHAZINE HCL 25 MG/ML IJ SOLN
25.0000 mg | Freq: Once | INTRAMUSCULAR | Status: AC
  Administered 2019-01-25: 25 mg via INTRAMUSCULAR

## 2019-01-25 MED ORDER — ONDANSETRON 4 MG PO TBDP: 4.0000 mg | ORAL_TABLET | Freq: Three times a day (TID) | ORAL | 0 refills | Status: DC | PRN

## 2019-01-25 MED ORDER — PREDNISONE 50 MG PO TABS: 50.0000 mg | ORAL_TABLET | Freq: Every day | ORAL | 0 refills | Status: DC

## 2019-01-25 NOTE — Progress Notes (Signed)
There were no vitals taken for this visit.   Subjective:    Patient ID: Meagan York, female    DOB: September 20, 1955, 63 y.o.   MRN: [id number]  HPI: Meagan York is a 63 y.o. female  Chief Complaint  Patient presents with  . URI    X 4 days   UPPER RESPIRATORY TRACT INFECTION- severe pain and pressure behind her eyes with a lot of nausea. Not feeling well at all Duration: 2.5 days Worst symptom: headaches, nausea Fever: no Cough: no Shortness of breath: no Wheezing: no Chest pain: no Chest tightness: no Chest congestion: no Nasal congestion: no Runny nose: yes Post nasal drip: yes Sneezing: no Sore throat: no Swollen glands: no Sinus pressure: yes Headache: yes Face pain: no Toothache: no Ear pain: no  Ear pressure: no  Eyes red/itching:yes Eye drainage/crusting: yes  Vomiting: no Rash: no Fatigue: yes Sick contacts: no Strep contacts: no  Context: stable Recurrent sinusitis: no Relief with OTC cold/cough medications: no  Treatments attempted: cold/sinus and anti-histamine    Relevant past medical, surgical, family and social history reviewed and updated as indicated. Interim medical history since our last visit reviewed. Allergies and medications reviewed and updated.  Review of Systems  Constitutional: Negative.   HENT: Positive for congestion, postnasal drip, rhinorrhea and sinus pain. Negative for dental problem, drooling, ear discharge, ear pain, facial swelling, hearing loss, mouth sores, nosebleeds, sinus pressure, sneezing, sore throat, tinnitus, trouble swallowing and voice change.   Eyes: Positive for pain. Negative for photophobia, discharge, redness, itching and visual disturbance.  Gastrointestinal: Positive for abdominal distention, abdominal pain and nausea. Negative for anal bleeding, blood in stool, constipation, diarrhea, rectal pain and vomiting.  Musculoskeletal: Negative.   Skin: Negative.   Neurological: Positive for headaches.  Negative for dizziness, tremors, seizures, syncope, facial asymmetry, speech difficulty, weakness, light-headedness and numbness.  Hematological: Negative.   Psychiatric/Behavioral: Negative for agitation, behavioral problems, confusion, decreased concentration, dysphoric mood, hallucinations, self-injury, sleep disturbance and suicidal ideas. The patient is not nervous/anxious and is not hyperactive.     Per HPI unless specifically indicated above     Objective:    There were no vitals taken for this visit.  Wt Readings from Last 3 Encounters:  11/19/18 200 lb (90.7 kg)  11/09/18 201 lb 9.6 oz (91.4 kg)  10/14/18 200 lb (90.7 kg)    Physical Exam Vitals signs and nursing note reviewed.  Pulmonary:     Effort: Pulmonary effort is normal. No respiratory distress.     Comments: Speaking in full sentences Neurological:     Mental Status: She is alert.  Psychiatric:        Mood and Affect: Mood normal.        Behavior: Behavior normal.        Thought Content: Thought content normal.        Judgment: Judgment normal.     Results for orders placed or performed in visit on 62/70/35  Basic metabolic panel  Result Value Ref Range   Glucose 103 (H) 65 - 99 mg/dL   BUN 6 (L) 8 - 27 mg/dL   Creatinine, Ser 0.56 (L) 0.57 - 1.00 mg/dL   GFR calc non Af Amer 101 >59 mL/min/1.73   GFR calc Af Amer 116 >59 mL/min/1.73   BUN/Creatinine Ratio 11 (L) 12 - 28   Sodium 141 134 - 144 mmol/L   Potassium 3.6 3.5 - 5.2 mmol/L   Chloride 102 96 - 106 mmol/L  CO2 26 20 - 29 mmol/L   Calcium 9.0 8.7 - 10.3 mg/dL      Assessment & Plan:   Problem List Items Addressed This Visit    None    Visit Diagnoses    Other migraine without status migrainosus, not intractable    -  Primary   Will have her come in for toradol and phenergan shot. Rx for prednisone and zofran to start tomorrow if not resolving. Call with any concerns or if not improved   Relevant Medications   ketorolac (TORADOL)  injection 60 mg (Completed)   promethazine (PHENERGAN) injection 25 mg (Completed)       Follow up plan: Return if symptoms worsen or fail to improve.   . This visit was completed via telephone due to the restrictions of the COVID-19 pandemic. All issues as above were discussed and addressed but no physical exam was performed. If it was felt that the patient should be evaluated in the office, they were directed there. The patient verbally consented to this visit. Patient was unable to complete an audio/visual visit due to Lack of equipment. Due to the catastrophic nature of the COVID-19 pandemic, this visit was done through audio contact only. . Location of the patient: home . Location of the provider: home . Those involved with this call:  . Provider: Park Liter, DO . CMA: Lesle Chris, [place] . Front Desk/Registration: Jill Side  . Time spent on call: 22 minutes on the phone discussing health concerns. 25 minutes total spent in review of patient's record and preparation of their chart.

## 2019-02-02 ENCOUNTER — Encounter: Payer: Self-pay | Admitting: Family Medicine

## 2019-02-02 ENCOUNTER — Telehealth: Payer: Self-pay | Admitting: Family Medicine

## 2019-02-02 NOTE — Telephone Encounter (Signed)
Called pt to schedule ASAP labs and 6 month physical, no answer, left voicemail.

## 2019-03-01 ENCOUNTER — Ambulatory Visit: Payer: Self-pay | Admitting: Family Medicine

## 2019-03-01 NOTE — Telephone Encounter (Signed)
Pt. Reports she started last week with increasing shortness of breath and some wheezing. Has a mild runny nose. No fever. States she has COPD and steroids help her breathing. Request Dr. Wynetta Emery send in a steroid for her. Please advise. She is working second shift, so please leave a message.  Answer Assessment - Initial Assessment Questions 1. RESPIRATORY STATUS: "Describe your breathing?" (e.g., wheezing, shortness of breath, unable to speak, severe coughing)      Shortness of breath 2. ONSET: "When did this breathing problem begin?"      Started last week 3. PATTERN "Does the difficult breathing come and go, or has it been constant since it started?"      Constant 4. SEVERITY: "How bad is your breathing?" (e.g., mild, moderate, severe)    - MILD: No SOB at rest, mild SOB with walking, speaks normally in sentences, can lay down, no retractions, pulse < 100.    - MODERATE: SOB at rest, SOB with minimal exertion and prefers to sit, cannot lie down flat, speaks in phrases, mild retractions, audible wheezing, pulse 100-120.    - SEVERE: Very SOB at rest, speaks in single words, struggling to breathe, sitting hunched forward, retractions, pulse > 120      Mild 5. RECURRENT SYMPTOM: "Have you had difficulty breathing before?" If so, ask: "When was the last time?" and "What happened that time?"      Yes 6. CARDIAC HISTORY: "Do you have any history of heart disease?" (e.g., heart attack, angina, bypass surgery, angioplasty)      No 7. LUNG HISTORY: "Do you have any history of lung disease?"  (e.g., pulmonary embolus, asthma, emphysema)     COPD 8. CAUSE: "What do you think is causing the breathing problem?"      COPD 9. OTHER SYMPTOMS: "Do you have any other symptoms? (e.g., dizziness, runny nose, cough, chest pain, fever)     Headache, runny nose 10. PREGNANCY: "Is there any chance you are pregnant?" "When was your last menstrual period?"       no 11. TRAVEL: "Have you traveled out of the country  in the last month?" (e.g., travel history, exposures)       No  Protocols used: BREATHING DIFFICULTY-A-AH

## 2019-03-01 NOTE — Telephone Encounter (Signed)
Called and left detailed message with information on patients vm. Okay'd by pt per RNs note.

## 2019-03-01 NOTE — Telephone Encounter (Signed)
Needs virtual appointment 

## 2019-03-02 ENCOUNTER — Encounter: Payer: Self-pay | Admitting: Family Medicine

## 2019-03-02 ENCOUNTER — Other Ambulatory Visit: Payer: Self-pay

## 2019-03-02 ENCOUNTER — Ambulatory Visit (INDEPENDENT_AMBULATORY_CARE_PROVIDER_SITE_OTHER): Payer: 59 | Admitting: Family Medicine

## 2019-03-02 DIAGNOSIS — J441 Chronic obstructive pulmonary disease with (acute) exacerbation: Secondary | ICD-10-CM

## 2019-03-02 MED ORDER — PREDNISONE 10 MG PO TABS: ORAL_TABLET | ORAL | 0 refills | Status: DC

## 2019-03-02 MED ORDER — AZITHROMYCIN 250 MG PO TABS: ORAL_TABLET | ORAL | 0 refills | Status: DC

## 2019-03-02 NOTE — Progress Notes (Signed)
There were no vitals taken for this visit.   Subjective:    Patient ID: Meagan York, female    DOB: 10-10-1955, 63 y.o.   MRN: [id number]  HPI: Meagan York is a 62 y.o. female  Chief Complaint  Patient presents with  . COPD    increasing shortness of breath and some wheezing. Has a mild runny nose. No fever.   . Cough    Started getting worse last week. Productive cough   UPPER RESPIRATORY TRACT INFECTION Duration: 10 days  Worst symptom: SOB, cough, wheezing Fever: no Cough: yes Shortness of breath: yes Wheezing: no Chest pain: no Chest tightness: yes Chest congestion: yes Nasal congestion: yes Runny nose: yes Post nasal drip: no Sneezing: yes Sore throat: no Swollen glands: no Sinus pressure: yes Headache: yes Face pain: no Toothache: no Ear pain: no  Ear pressure: no  Eyes red/itching:yes Eye drainage/crusting: no  Vomiting: no Rash: no Fatigue: yes Sick contacts: no Strep contacts: no  Context: worse Recurrent sinusitis: no Relief with OTC cold/cough medications: no  Treatments attempted: none   Relevant past medical, surgical, family and social history reviewed and updated as indicated. Interim medical history since our last visit reviewed. Allergies and medications reviewed and updated.  Review of Systems  Constitutional: Negative.   HENT: Positive for congestion, rhinorrhea and sinus pain. Negative for dental problem, drooling, ear discharge, ear pain, facial swelling, hearing loss, mouth sores, nosebleeds, postnasal drip, sinus pressure, sneezing, sore throat, tinnitus, trouble swallowing and voice change.   Eyes: Negative.   Respiratory: Positive for cough, chest tightness, shortness of breath and wheezing. Negative for apnea, choking and stridor.   Cardiovascular: Negative.   Gastrointestinal: Negative.   Psychiatric/Behavioral: Negative.     Per HPI unless specifically indicated above     Objective:    There were no vitals taken  for this visit.  Wt Readings from Last 3 Encounters:  11/19/18 200 lb (90.7 kg)  11/09/18 201 lb 9.6 oz (91.4 kg)  10/14/18 200 lb (90.7 kg)    Physical Exam Vitals signs and nursing note reviewed.  Pulmonary:     Effort: Pulmonary effort is normal. No respiratory distress.     Comments: Speaking in full sentences Neurological:     Mental Status: She is alert.  Psychiatric:        Mood and Affect: Mood normal.        Behavior: Behavior normal.        Thought Content: Thought content normal.        Judgment: Judgment normal.     Results for orders placed or performed in visit on 78/29/56  Basic metabolic panel  Result Value Ref Range   Glucose 103 (H) 65 - 99 mg/dL   BUN 6 (L) 8 - 27 mg/dL   Creatinine, Ser 0.56 (L) 0.57 - 1.00 mg/dL   GFR calc non Af Amer 101 >59 mL/min/1.73   GFR calc Af Amer 116 >59 mL/min/1.73   BUN/Creatinine Ratio 11 (L) 12 - 28   Sodium 141 134 - 144 mmol/L   Potassium 3.6 3.5 - 5.2 mmol/L   Chloride 102 96 - 106 mmol/L   CO2 26 20 - 29 mmol/L   Calcium 9.0 8.7 - 10.3 mg/dL      Assessment & Plan:   Problem List Items Addressed This Visit    None    Visit Diagnoses    COPD exacerbation ([place])    -  Primary   Will  treat with prednisone and azithromycin. Out of work for 1 week. Recheck 1 week. Call with any concerns.    Relevant Medications   azithromycin (ZITHROMAX) 250 MG tablet   predniSONE (DELTASONE) 10 MG tablet       Follow up plan: Return in about 1 week (around 03/09/2019) for follow up COPD- virtual.   . This visit was completed via telephone due to the restrictions of the COVID-19 pandemic. All issues as above were discussed and addressed but no physical exam was performed. If it was felt that the patient should be evaluated in the office, they were directed there. The patient verbally consented to this visit. Patient was unable to complete an audio/visual visit due to Lack of equipment. Due to the catastrophic nature of the  COVID-19 pandemic, this visit was done through audio contact only. . Location of the patient: home . Location of the provider: work . Those involved with this call:  . Provider: Park Liter, DO . CMA: Gerda Diss, CMA . Front Desk/Registration: Don Perking  . Time spent on call: 21 minutes on the phone discussing health concerns. 23 minutes total spent in review of patient's record and preparation of their chart.

## 2019-03-20 ENCOUNTER — Ambulatory Visit: Payer: 59 | Admitting: Pulmonary Disease

## 2019-03-27 ENCOUNTER — Ambulatory Visit: Payer: 59 | Admitting: Family Medicine

## 2019-04-12 ENCOUNTER — Encounter: Payer: Self-pay | Admitting: General Surgery

## 2019-04-13 ENCOUNTER — Other Ambulatory Visit: Payer: Self-pay

## 2019-04-13 ENCOUNTER — Encounter: Payer: Self-pay | Admitting: Family Medicine

## 2019-04-13 ENCOUNTER — Ambulatory Visit (INDEPENDENT_AMBULATORY_CARE_PROVIDER_SITE_OTHER): Payer: 59 | Admitting: Family Medicine

## 2019-04-13 VITALS — BP 120/83 | HR 96 | Temp 98.3°F

## 2019-04-13 DIAGNOSIS — J449 Chronic obstructive pulmonary disease, unspecified: Secondary | ICD-10-CM | POA: Diagnosis not present

## 2019-04-13 MED ORDER — SPIRIVA RESPIMAT 2.5 MCG/ACT IN AERS: 1.0000 | INHALATION_SPRAY | Freq: Every day | RESPIRATORY_TRACT | 6 refills | Status: DC

## 2019-04-13 NOTE — Assessment & Plan Note (Signed)
Will add spiriva during the summer time and recheck 1 month. Call with any concerns. Continue to monitor.

## 2019-04-13 NOTE — Progress Notes (Signed)
BP 120/83   Pulse 96   Temp 98.3 F (36.8 C)   SpO2 96%    Subjective:    Patient ID: Meagan York, female    DOB: 1956/07/26, 63 y.o.   MRN: [id number]  HPI: Meagan York is a 63 y.o. female  Chief Complaint  Patient presents with  . COPD  . Headache   COPD COPD status: uncontrolled Satisfied with current treatment?: no Oxygen use: no Dyspnea frequency: every few days- especially with the humidity Cough frequency: every few days- especially with the humidity Rescue inhaler frequency:  occasionally Limitation of activity: yes Productive cough: no Pneumovax: Up to Date Influenza: Up to Date  Relevant past medical, surgical, family and social history reviewed and updated as indicated. Interim medical history since our last visit reviewed. Allergies and medications reviewed and updated.  Review of Systems  Constitutional: Positive for fatigue. Negative for activity change, appetite change, chills, diaphoresis, fever and unexpected weight change.  HENT: Positive for congestion. Negative for dental problem, drooling, ear discharge, ear pain, facial swelling, hearing loss, mouth sores, nosebleeds, postnasal drip, rhinorrhea, sinus pressure, sinus pain, sneezing, sore throat, tinnitus, trouble swallowing and voice change.   Eyes: Negative.   Respiratory: Positive for cough, chest tightness, shortness of breath and wheezing. Negative for apnea, choking and stridor.   Cardiovascular: Negative.   Gastrointestinal: Negative.   Psychiatric/Behavioral: Negative.     Per HPI unless specifically indicated above     Objective:    BP 120/83   Pulse 96   Temp 98.3 F (36.8 C)   SpO2 96%   Wt Readings from Last 3 Encounters:  11/19/18 200 lb (90.7 kg)  11/09/18 201 lb 9.6 oz (91.4 kg)  10/14/18 200 lb (90.7 kg)    Physical Exam Vitals signs and nursing note reviewed.  Constitutional:      General: She is not in acute distress.    Appearance: Normal appearance. She is  not ill-appearing, toxic-appearing or diaphoretic.  HENT:     Head: Normocephalic and atraumatic.     Right Ear: External ear normal.     Left Ear: External ear normal.     Nose: Nose normal.     Mouth/Throat:     Mouth: Mucous membranes are moist.     Pharynx: Oropharynx is clear.  Eyes:     General: No scleral icterus.       Right eye: No discharge.        Left eye: No discharge.     Extraocular Movements: Extraocular movements intact.     Conjunctiva/sclera: Conjunctivae normal.     Pupils: Pupils are equal, round, and reactive to light.  Neck:     Musculoskeletal: Normal range of motion and neck supple.  Cardiovascular:     Rate and Rhythm: Normal rate and regular rhythm.     Pulses: Normal pulses.     Heart sounds: Normal heart sounds. No murmur. No friction rub. No gallop.   Pulmonary:     Effort: Pulmonary effort is normal. No respiratory distress.     Breath sounds: Normal breath sounds. No stridor. No wheezing, rhonchi or rales.  Chest:     Chest wall: No tenderness.  Musculoskeletal: Normal range of motion.  Skin:    General: Skin is warm and dry.     Capillary Refill: Capillary refill takes less than 2 seconds.     Coloration: Skin is not jaundiced or pale.     Findings: No bruising, erythema, lesion  or rash.  Neurological:     General: No focal deficit present.     Mental Status: She is alert and oriented to person, place, and time. Mental status is at baseline.  Psychiatric:        Mood and Affect: Mood normal.        Behavior: Behavior normal.        Thought Content: Thought content normal.        Judgment: Judgment normal.     Results for orders placed or performed in visit on 42/68/34  Basic metabolic panel  Result Value Ref Range   Glucose 103 (H) 65 - 99 mg/dL   BUN 6 (L) 8 - 27 mg/dL   Creatinine, Ser 0.56 (L) 0.57 - 1.00 mg/dL   GFR calc non Af Amer 101 >59 mL/min/1.73   GFR calc Af Amer 116 >59 mL/min/1.73   BUN/Creatinine Ratio 11 (L) 12 - 28    Sodium 141 134 - 144 mmol/L   Potassium 3.6 3.5 - 5.2 mmol/L   Chloride 102 96 - 106 mmol/L   CO2 26 20 - 29 mmol/L   Calcium 9.0 8.7 - 10.3 mg/dL      Assessment & Plan:   Problem List Items Addressed This Visit      Respiratory   COPD (chronic obstructive pulmonary disease) ([place]) - Primary    Will add spiriva during the summer time and recheck 1 month. Call with any concerns. Continue to monitor.       Relevant Medications   Tiotropium Bromide Monohydrate (SPIRIVA RESPIMAT) 2.5 MCG/ACT AERS       Follow up plan: Return in about 4 weeks (around 05/11/2019) for follow up COPD.

## 2019-04-20 ENCOUNTER — Encounter: Payer: Self-pay | Admitting: Family Medicine

## 2019-04-20 ENCOUNTER — Other Ambulatory Visit: Payer: Self-pay

## 2019-04-20 ENCOUNTER — Ambulatory Visit (INDEPENDENT_AMBULATORY_CARE_PROVIDER_SITE_OTHER): Payer: 59 | Admitting: Family Medicine

## 2019-04-20 VITALS — BP 152/91 | HR 86 | Temp 98.6°F

## 2019-04-20 DIAGNOSIS — M79671 Pain in right foot: Secondary | ICD-10-CM

## 2019-04-20 MED ORDER — PREDNISONE 10 MG PO TABS: ORAL_TABLET | ORAL | 0 refills | Status: DC

## 2019-04-20 MED ORDER — OXYCODONE-ACETAMINOPHEN 5-325 MG PO TABS: 1.0000 | ORAL_TABLET | Freq: Three times a day (TID) | ORAL | 0 refills | Status: DC | PRN

## 2019-04-20 NOTE — Progress Notes (Signed)
BP (!) 152/91   Pulse 86   Temp 98.6 F (37 C)   SpO2 97%    Subjective:    Patient ID: Meagan York, female    DOB: 09-18-1955, 63 y.o.   MRN: [id number]  HPI: Meagan York is a 63 y.o. female  Chief Complaint  Patient presents with  . Foot Pain    Right foot   3 days of right heel pain and swelling that extends from bottom of foot up leg. Hx of heel spurs, was followed by Podiatry several years ago for this issue and was told she may need surgery in the future if continuing to flare up. Taking celebrex for this chronic pain and inflammation which usually helps but this is acutely worsening. Adamantly declines a surgery at this time and hoping to manage this conservatively. Denies heat, redness, fevers, new injury or activity.   Relevant past medical, surgical, family and social history reviewed and updated as indicated. Interim medical history since our last visit reviewed. Allergies and medications reviewed and updated.  Review of Systems  Per HPI unless specifically indicated above     Objective:    BP (!) 152/91   Pulse 86   Temp 98.6 F (37 C)   SpO2 97%   Wt Readings from Last 3 Encounters:  11/19/18 200 lb (90.7 kg)  11/09/18 201 lb 9.6 oz (91.4 kg)  10/14/18 200 lb (90.7 kg)    Physical Exam Vitals signs and nursing note reviewed.  Constitutional:      Appearance: Normal appearance. She is not ill-appearing.  HENT:     Head: Atraumatic.  Eyes:     Extraocular Movements: Extraocular movements intact.     Conjunctiva/sclera: Conjunctivae normal.  Neck:     Musculoskeletal: Normal range of motion and neck supple.  Cardiovascular:     Rate and Rhythm: Normal rate and regular rhythm.     Heart sounds: Normal heart sounds.  Pulmonary:     Effort: Pulmonary effort is normal.     Breath sounds: Normal breath sounds.  Musculoskeletal: Normal range of motion.     Comments: Edema and ttp b/l right achilles tendon. No color change, heat, skin injuries  noted  Skin:    General: Skin is warm and dry.  Neurological:     Mental Status: She is alert and oriented to person, place, and time.  Psychiatric:        Mood and Affect: Mood normal.        Thought Content: Thought content normal.        Judgment: Judgment normal.     Results for orders placed or performed in visit on 82/42/35  Basic metabolic panel  Result Value Ref Range   Glucose 103 (H) 65 - 99 mg/dL   BUN 6 (L) 8 - 27 mg/dL   Creatinine, Ser 0.56 (L) 0.57 - 1.00 mg/dL   GFR calc non Af Amer 101 >59 mL/min/1.73   GFR calc Af Amer 116 >59 mL/min/1.73   BUN/Creatinine Ratio 11 (L) 12 - 28   Sodium 141 134 - 144 mmol/L   Potassium 3.6 3.5 - 5.2 mmol/L   Chloride 102 96 - 106 mmol/L   CO2 26 20 - 29 mmol/L   Calcium 9.0 8.7 - 10.3 mg/dL      Assessment & Plan:   Problem List Items Addressed This Visit    None    Visit Diagnoses    Pain of right heel    -  Primary    With known heel spurs, currently acutely inflamed and painful. Tx with prednisone, epsom salt soaks, stretches, and prn oxycodone for severe pain. Continue celebrex as before, tylenol prn. Controlled substance database reviewed and appropriate. F/u with Podiatry if not improving.    Follow up plan: Return if symptoms worsen or fail to improve.

## 2019-04-26 ENCOUNTER — Telehealth: Payer: Self-pay | Admitting: Family Medicine

## 2019-04-26 ENCOUNTER — Other Ambulatory Visit: Payer: Self-pay | Admitting: Family Medicine

## 2019-04-26 NOTE — Telephone Encounter (Signed)
Medication: oxyCODONE-acetaminophen (PERCOCET/ROXICET) 5-325 MG tablet [[id number]]  podiatrist can not right prescription for pain of bur spurs  Has the patient contacted their pharmacy? Yes  (Agent: If no, request that the patient contact the pharmacy for the refill.) (Agent: If yes, when and what did the pharmacy advise?)  Preferred Pharmacy (with phone number or street name): [id number] - [place], [address] [phone] (Phone) [phone] (Fax)   Agent: Please be advised that RX refills may take up to 3 business days. We ask that you follow-up with your pharmacy.

## 2019-04-27 ENCOUNTER — Telehealth: Payer: Self-pay | Admitting: Family Medicine

## 2019-04-27 MED ORDER — OXYCODONE-ACETAMINOPHEN 5-325 MG PO TABS: 1.0000 | ORAL_TABLET | Freq: Three times a day (TID) | ORAL | 0 refills | Status: DC | PRN

## 2019-04-27 NOTE — Telephone Encounter (Signed)
Will refill once more, if not improving will need to follow up with her Podiatrist for further management  Copied from [place] [phone]. Topic: General - Other >> Apr 27, 2019 10:04 AM Keene Breath wrote: Reason for CRM: Patient called to request some pain medication for her foot.  Please advise and call patient back at [phone] >> Apr 27, 2019 10:08 AM Jill Side wrote: Pt was saw 04/20/19 please advise.

## 2019-04-27 NOTE — Telephone Encounter (Signed)
Patient notified. She states that she went to the podiatrist yesterday. States she was having tingling in her toes all the way up to her knee. States she was given prednisone again and wants to have surgery. States the podiatrist wants to do other options before surgery for the heel spur. She states it is getting bigger and growing so she really just wants it operated on. States she did not see Dr. Elvina Mattes yesterday but is going to try to see him next week to see what his opinion is. She states that he is aware of how long she has had this for 4 years. She states that she just wanted to make [facility] aware of this.

## 2019-04-27 NOTE — Telephone Encounter (Signed)
Routing to provider  

## 2019-05-10 ENCOUNTER — Encounter: Payer: Self-pay | Admitting: Family Medicine

## 2019-05-10 ENCOUNTER — Other Ambulatory Visit: Payer: Self-pay

## 2019-05-10 ENCOUNTER — Ambulatory Visit: Payer: 59 | Admitting: Family Medicine

## 2019-05-10 DIAGNOSIS — M79671 Pain in right foot: Secondary | ICD-10-CM | POA: Diagnosis not present

## 2019-05-10 DIAGNOSIS — M79672 Pain in left foot: Secondary | ICD-10-CM | POA: Diagnosis not present

## 2019-05-10 DIAGNOSIS — I129 Hypertensive chronic kidney disease with stage 1 through stage 4 chronic kidney disease, or unspecified chronic kidney disease: Secondary | ICD-10-CM | POA: Diagnosis not present

## 2019-05-10 DIAGNOSIS — J449 Chronic obstructive pulmonary disease, unspecified: Secondary | ICD-10-CM

## 2019-05-10 MED ORDER — HYDROCODONE-ACETAMINOPHEN 5-325 MG PO TABS: 1.0000 | ORAL_TABLET | Freq: Two times a day (BID) | ORAL | 0 refills | Status: DC | PRN

## 2019-05-10 MED ORDER — TELMISARTAN 80 MG PO TABS: 80.0000 mg | ORAL_TABLET | Freq: Every day | ORAL | 1 refills | Status: DC

## 2019-05-10 NOTE — Progress Notes (Signed)
BP (!) 146/96   Pulse 95   Temp 98.7 F (37.1 C)   SpO2 96%    Subjective:    Patient ID: Meagan York, female    DOB: 28-Nov-1955, 63 y.o.   MRN: [id number]  HPI: TAVIONNA York is a 63 y.o. female  Chief Complaint  Patient presents with  . COPD  . Foot Pain   COPD COPD status: better Satisfied with current treatment?: yes Oxygen use: no Dyspnea frequency: pretty often Cough frequency: rarely Rescue inhaler frequency: rarely  Limitation of activity: yes Productive cough:  Pneumovax: Up to Date Influenza: will return  Foot continues to hurt- Saw podiatry on 8/13. Given prednisone and a boot. She is wearing it at night. She notes that it hurting. She is seeing podiatry with an appointment next week.   HYPERTENSION Hypertension status: stable  Satisfied with current treatment? yes Duration of hypertension: chronic BP monitoring frequency:  not checking BP medication side effects:  no Medication compliance: excellent compliance Previous BP meds: telmisartan Aspirin: no Recurrent headaches: no Visual changes: no Palpitations: no Dyspnea: no Chest pain: no Lower extremity edema: no Dizzy/lightheaded: no  Relevant past medical, surgical, family and social history reviewed and updated as indicated. Interim medical history since our last visit reviewed. Allergies and medications reviewed and updated.  Review of Systems  Constitutional: Negative.   Respiratory: Negative.   Cardiovascular: Negative.   Gastrointestinal: Negative.   Musculoskeletal: Positive for arthralgias, gait problem and myalgias. Negative for back pain, joint swelling, neck pain and neck stiffness.  Skin: Negative.   Psychiatric/Behavioral: Negative.     Per HPI unless specifically indicated above     Objective:    BP (!) 146/96   Pulse 95   Temp 98.7 F (37.1 C)   SpO2 96%   Wt Readings from Last 3 Encounters:  11/19/18 200 lb (90.7 kg)  11/09/18 201 lb 9.6 oz (91.4 kg)   10/14/18 200 lb (90.7 kg)    Physical Exam Vitals signs and nursing note reviewed.  Constitutional:      General: She is not in acute distress.    Appearance: Normal appearance. She is not ill-appearing, toxic-appearing or diaphoretic.  HENT:     Head: Normocephalic and atraumatic.     Right Ear: External ear normal.     Left Ear: External ear normal.     Nose: Nose normal.     Mouth/Throat:     Mouth: Mucous membranes are moist.     Pharynx: Oropharynx is clear.  Eyes:     General: No scleral icterus.       Right eye: No discharge.        Left eye: No discharge.     Extraocular Movements: Extraocular movements intact.     Conjunctiva/sclera: Conjunctivae normal.     Pupils: Pupils are equal, round, and reactive to light.  Neck:     Musculoskeletal: Normal range of motion and neck supple.  Cardiovascular:     Rate and Rhythm: Normal rate and regular rhythm.     Pulses: Normal pulses.     Heart sounds: Normal heart sounds. No murmur. No friction rub. No gallop.   Pulmonary:     Effort: Pulmonary effort is normal. No respiratory distress.     Breath sounds: Normal breath sounds. No stridor. No wheezing, rhonchi or rales.  Chest:     Chest wall: No tenderness.  Musculoskeletal: Normal range of motion.  Skin:    General: Skin is warm  and dry.     Capillary Refill: Capillary refill takes less than 2 seconds.     Coloration: Skin is not jaundiced or pale.     Findings: No bruising, erythema, lesion or rash.  Neurological:     General: No focal deficit present.     Mental Status: She is alert and oriented to person, place, and time. Mental status is at baseline.  Psychiatric:        Mood and Affect: Mood normal.        Behavior: Behavior normal.        Thought Content: Thought content normal.        Judgment: Judgment normal.     Results for orders placed or performed in visit on 99991111  Basic metabolic panel  Result Value Ref Range   Glucose 103 (H) 65 - 99 mg/dL    BUN 6 (L) 8 - 27 mg/dL   Creatinine, Ser 0.56 (L) 0.57 - 1.00 mg/dL   GFR calc non Af Amer 101 >59 mL/min/1.73   GFR calc Af Amer 116 >59 mL/min/1.73   BUN/Creatinine Ratio 11 (L) 12 - 28   Sodium 141 134 - 144 mmol/L   Potassium 3.6 3.5 - 5.2 mmol/L   Chloride 102 96 - 106 mmol/L   CO2 26 20 - 29 mmol/L   Calcium 9.0 8.7 - 10.3 mg/dL      Assessment & Plan:   Problem List Items Addressed This Visit      Respiratory   COPD (chronic obstructive pulmonary disease) ([place])    Under good control on current regimen. Continue current regimen. Continue to monitor. Call with any concerns. Refills given.          Genitourinary   Benign hypertensive renal disease    Slightly elevated today likely due to pain. Continue to work on diet and exercise. DASH diet. Call with any concerns.         Other   Heel pain, bilateral    Will give pain medicine until she gets into see podiatry. Call with any concerns. Continue to monitor.           Follow up plan: Return in about 2 months (around 07/10/2019).

## 2019-05-12 ENCOUNTER — Encounter: Payer: Self-pay | Admitting: Family Medicine

## 2019-05-12 NOTE — Assessment & Plan Note (Signed)
Slightly elevated today likely due to pain. Continue to work on diet and exercise. DASH diet. Call with any concerns.

## 2019-05-12 NOTE — Assessment & Plan Note (Signed)
Under good control on current regimen. Continue current regimen. Continue to monitor. Call with any concerns. Refills given.   

## 2019-05-12 NOTE — Assessment & Plan Note (Signed)
Will give pain medicine until she gets into see podiatry. Call with any concerns. Continue to monitor.

## 2019-05-16 ENCOUNTER — Telehealth: Payer: Self-pay | Admitting: Family Medicine

## 2019-05-16 NOTE — Telephone Encounter (Signed)
Patient states that she was just seen by podiatry, she is scheduled for surgery on 9/28, but they do not give pain meds until after surgery.

## 2019-05-16 NOTE — Telephone Encounter (Signed)
Routing to provider  

## 2019-05-16 NOTE — Telephone Encounter (Signed)
She will need to ask her podiatrist for this.

## 2019-05-16 NOTE — Telephone Encounter (Signed)
Medication Refill - Medication:  oxyCODONE-acetaminophen 5-325 MG  Has the patient contacted their pharmacy? Yes advised to call PCP. Patient is going to be having surgery on foot and is wanting to have medication to hold her over, for pain, till surgery on sept.18th. Please advise.   Preferred Pharmacy (with phone number or street name):  [id number] - [place], [place]. [address] [phone] (Phone) [phone] (Fax)   Agent: Please be advised that RX refills may take up to 3 business days. We ask that you follow-up with your pharmacy.

## 2019-05-17 NOTE — Telephone Encounter (Signed)
Called and left a message letting patient know that she will need an appt.

## 2019-05-17 NOTE — Telephone Encounter (Signed)
Will need an appointment

## 2019-05-18 ENCOUNTER — Ambulatory Visit (INDEPENDENT_AMBULATORY_CARE_PROVIDER_SITE_OTHER): Payer: 59 | Admitting: Family Medicine

## 2019-05-18 ENCOUNTER — Encounter: Payer: Self-pay | Admitting: Family Medicine

## 2019-05-18 ENCOUNTER — Other Ambulatory Visit: Payer: Self-pay

## 2019-05-18 DIAGNOSIS — M79671 Pain in right foot: Secondary | ICD-10-CM | POA: Diagnosis not present

## 2019-05-18 MED ORDER — OXYCODONE-ACETAMINOPHEN 5-325 MG PO TABS: 1.0000 | ORAL_TABLET | Freq: Two times a day (BID) | ORAL | 0 refills | Status: DC | PRN

## 2019-05-18 NOTE — Progress Notes (Signed)
There were no vitals taken for this visit.   Subjective:    Patient ID: Meagan York, female    DOB: 1955/12/21, 63 y.o.   MRN: [id number]  HPI: Meagan York is a 63 y.o. female  Chief Complaint  Patient presents with  . Foot Pain   FOOT PAIN Duration: months Involved foot: right Mechanism of injury: unknown Location: R heel Onset: gradual  Severity: severe  Quality:  sharp, aching and shooting Frequency: constant Radiation: yes Aggravating factors: weight bearing and walking  Alleviating factors: ice, HEP, APAP, NSAIDs and rest  Status: worse Treatments attempted: rest, ice, heat, APAP, ibuprofen, aleve, physical therapy and HEP  Relief with NSAIDs?:  mild Weakness with weight bearing or walking: no Morning stiffness: no Swelling: yes Redness: no Bruising: no Paresthesias / decreased sensation: no  Fevers:no  Relevant past medical, surgical, family and social history reviewed and updated as indicated. Interim medical history since our last visit reviewed. Allergies and medications reviewed and updated.  Review of Systems  Constitutional: Negative.   Respiratory: Negative.   Cardiovascular: Negative.   Musculoskeletal: Positive for arthralgias, joint swelling and myalgias. Negative for back pain, gait problem, neck pain and neck stiffness.  Skin: Negative.   Neurological: Negative.   Psychiatric/Behavioral: Negative.     Per HPI unless specifically indicated above     Objective:    There were no vitals taken for this visit.  Wt Readings from Last 3 Encounters:  11/19/18 200 lb (90.7 kg)  11/09/18 201 lb 9.6 oz (91.4 kg)  10/14/18 200 lb (90.7 kg)    Physical Exam Vitals signs and nursing note reviewed.  Pulmonary:     Effort: Pulmonary effort is normal. No respiratory distress.     Comments: Speaking in full sentences Neurological:     Mental Status: She is alert.  Psychiatric:        Mood and Affect: Mood normal.        Behavior: Behavior  normal.        Thought Content: Thought content normal.        Judgment: Judgment normal.     Results for orders placed or performed in visit on 99991111  Basic metabolic panel  Result Value Ref Range   Glucose 103 (H) 65 - 99 mg/dL   BUN 6 (L) 8 - 27 mg/dL   Creatinine, Ser 0.56 (L) 0.57 - 1.00 mg/dL   GFR calc non Af Amer 101 >59 mL/min/1.73   GFR calc Af Amer 116 >59 mL/min/1.73   BUN/Creatinine Ratio 11 (L) 12 - 28   Sodium 141 134 - 144 mmol/L   Potassium 3.6 3.5 - 5.2 mmol/L   Chloride 102 96 - 106 mmol/L   CO2 26 20 - 29 mmol/L   Calcium 9.0 8.7 - 10.3 mg/dL      Assessment & Plan:   Problem List Items Addressed This Visit    None    Visit Diagnoses    Pain of right heel    -  Primary   To have surgery on 9/24. Will treat with percocet until the surgery- call with any concerns.        Follow up plan: Return As scheduled.   . This visit was completed via telephone due to the restrictions of the COVID-19 pandemic. All issues as above were discussed and addressed but no physical exam was performed. If it was felt that the patient should be evaluated in the office, they were directed there. The  patient verbally consented to this visit. Patient was unable to complete an audio/visual visit due to Lack of equipment. Due to the catastrophic nature of the COVID-19 pandemic, this visit was done through audio contact only. . Location of the patient: home . Location of the provider: home . Those involved with this call:  . Provider: Park Liter, DO . CMA: Tiffany Reel, CMA . Front Desk/Registration: Don Perking  . Time spent on call: 15 minutes on the phone discussing health concerns. 23 minutes total spent in review of patient's record and preparation of their chart.

## 2019-05-31 ENCOUNTER — Ambulatory Visit: Payer: 59 | Admitting: Family Medicine

## 2019-05-31 ENCOUNTER — Other Ambulatory Visit: Payer: Self-pay | Admitting: Podiatry

## 2019-05-31 ENCOUNTER — Encounter: Payer: Self-pay | Admitting: Family Medicine

## 2019-05-31 ENCOUNTER — Other Ambulatory Visit: Payer: Self-pay

## 2019-05-31 VITALS — BP 149/95 | HR 78 | Temp 98.6°F | Ht 65.0 in | Wt 205.0 lb

## 2019-05-31 DIAGNOSIS — I129 Hypertensive chronic kidney disease with stage 1 through stage 4 chronic kidney disease, or unspecified chronic kidney disease: Secondary | ICD-10-CM | POA: Diagnosis not present

## 2019-05-31 DIAGNOSIS — Z01818 Encounter for other preprocedural examination: Secondary | ICD-10-CM | POA: Diagnosis not present

## 2019-05-31 MED ORDER — AMLODIPINE BESYLATE 5 MG PO TABS: 5.0000 mg | ORAL_TABLET | Freq: Every day | ORAL | 0 refills | Status: DC

## 2019-05-31 MED ORDER — AMLODIPINE BESYLATE 5 MG PO TABS: 30.0000 mg | ORAL_TABLET | Freq: Every day | ORAL | 0 refills | Status: DC

## 2019-05-31 NOTE — Progress Notes (Signed)
BP (!) 149/95   Pulse 78   Temp 98.6 F (37 C) (Oral)   Ht 5\' 5"  (1.651 m)   Wt 205 lb (93 kg)   SpO2 95%   BMI 34.11 kg/m    Subjective:    Patient ID: Meagan York, female    DOB: 1956/05/05, 62 y.o.   MRN: [id number]  HPI: Meagan York is a 63 y.o. female  Chief Complaint  Patient presents with  . Surgical clearance    EKG   Patient presents today for surgical clearance for upcoming right foot/ankle surgery scheduled for 06/07/19. Needing EKG and labs today. Feeling well, in usual state of health aside from her foot pain. Denies CP, SOB, HAs, dizziness.   Relevant past medical, surgical, family and social history reviewed and updated as indicated. Interim medical history since our last visit reviewed. Allergies and medications reviewed and updated.  Review of Systems  Per HPI unless specifically indicated above     Objective:    BP (!) 149/95   Pulse 78   Temp 98.6 F (37 C) (Oral)   Ht 5\' 5"  (1.651 m)   Wt 205 lb (93 kg)   SpO2 95%   BMI 34.11 kg/m   Wt Readings from Last 3 Encounters:  05/31/19 205 lb (93 kg)  11/19/18 200 lb (90.7 kg)  11/09/18 201 lb 9.6 oz (91.4 kg)    Physical Exam Vitals signs and nursing note reviewed.  Constitutional:      Appearance: Normal appearance. She is not ill-appearing.  HENT:     Head: Atraumatic.     Nose: Nose normal.     Mouth/Throat:     Mouth: Mucous membranes are moist.     Pharynx: Oropharynx is clear.  Eyes:     Extraocular Movements: Extraocular movements intact.     Conjunctiva/sclera: Conjunctivae normal.  Neck:     Musculoskeletal: Normal range of motion and neck supple.  Cardiovascular:     Rate and Rhythm: Normal rate and regular rhythm.     Heart sounds: Normal heart sounds.  Pulmonary:     Effort: Pulmonary effort is normal.     Breath sounds: Normal breath sounds.  Musculoskeletal: Normal range of motion.  Skin:    General: Skin is warm and dry.  Neurological:     Mental Status: She  is alert and oriented to person, place, and time.  Psychiatric:        Mood and Affect: Mood normal.        Thought Content: Thought content normal.        Judgment: Judgment normal.     Results for orders placed or performed in visit on 05/31/19  Microscopic Examination   URINE  Result Value Ref Range   WBC, UA None seen 0 - 5 /hpf   RBC 0-2 0 - 2 /hpf   Epithelial Cells (non renal) 0-10 0 - 10 /hpf   Bacteria, UA Few (A) None seen/Few  CBC with Differential/Platelet  Result Value Ref Range   WBC 7.8 3.4 - 10.8 x10E3/uL   RBC 4.66 3.77 - 5.28 x10E6/uL   Hemoglobin 13.7 11.1 - 15.9 g/dL   Hematocrit 41.2 34.0 - 46.6 %   MCV 88 79 - 97 fL   MCH 29.4 26.6 - 33.0 pg   MCHC 33.3 31.5 - 35.7 g/dL   RDW 12.6 11.7 - 15.4 %   Platelets 327 150 - 450 x10E3/uL   Neutrophils 62 Not Estab. %  Lymphs 26 Not Estab. %   Monocytes 8 Not Estab. %   Eos 2 Not Estab. %   Basos 1 Not Estab. %   Neutrophils Absolute 4.9 1.4 - 7.0 x10E3/uL   Lymphocytes Absolute 2.1 0.7 - 3.1 x10E3/uL   Monocytes Absolute 0.6 0.1 - 0.9 x10E3/uL   EOS (ABSOLUTE) 0.2 0.0 - 0.4 x10E3/uL   Basophils Absolute 0.1 0.0 - 0.2 x10E3/uL   Immature Granulocytes 1 Not Estab. %   Immature Grans (Abs) 0.0 0.0 - 0.1 x10E3/uL  Comprehensive metabolic panel  Result Value Ref Range   Glucose 85 65 - 99 mg/dL   BUN 6 (L) 8 - 27 mg/dL   Creatinine, Ser 0.66 0.57 - 1.00 mg/dL   GFR calc non Af Amer 96 >59 mL/min/1.73   GFR calc Af Amer 110 >59 mL/min/1.73   BUN/Creatinine Ratio 9 (L) 12 - 28   Sodium 141 134 - 144 mmol/L   Potassium 3.7 3.5 - 5.2 mmol/L   Chloride 99 96 - 106 mmol/L   CO2 28 20 - 29 mmol/L   Calcium 9.6 8.7 - 10.3 mg/dL   Total Protein 7.0 6.0 - 8.5 g/dL   Albumin 4.4 3.8 - 4.8 g/dL   Globulin, Total 2.6 1.5 - 4.5 g/dL   Albumin/Globulin Ratio 1.7 1.2 - 2.2   Bilirubin Total 0.6 0.0 - 1.2 mg/dL   Alkaline Phosphatase 106 39 - 117 IU/L   AST 17 0 - 40 IU/L   ALT 21 0 - 32 IU/L  UA/M w/rflx Culture,  Routine   Specimen: Urine   URINE  Result Value Ref Range   Specific Gravity, UA 1.010 1.005 - 1.030   pH, UA 7.0 5.0 - 7.5   Color, UA Yellow Yellow   Appearance Ur Clear Clear   Leukocytes,UA Negative Negative   Protein,UA Negative Negative/Trace   Glucose, UA Negative Negative   Ketones, UA Negative Negative   RBC, UA Trace (A) Negative   Bilirubin, UA Negative Negative   Urobilinogen, Ur 0.2 0.2 - 1.0 mg/dL   Nitrite, UA Negative Negative   Microscopic Examination See below:       Assessment & Plan:   Problem List Items Addressed This Visit      Genitourinary   Benign hypertensive renal disease    BPs consistently elevated, will add amlodipine and recheck early next week. Script given for home monitor so she can be following readings at home. Continue telmisartan.        Other Visit Diagnoses    Preoperative clearance    -  Primary   EKG NSR wtihout acute T wave or ST changes, await lab results and repeat BP check prior to clearance   Relevant Orders   EKG 12-Lead (Completed)   CBC with Differential/Platelet (Completed)   Comprehensive metabolic panel (Completed)   UA/M w/rflx Culture, Routine (Completed)      Greater than 25 minutes spent today in direct care and counseling  Follow up plan: Return in about 4 days (around 06/04/2019) for BP recheck.

## 2019-05-31 NOTE — Discharge Instructions (Signed)
[facility] °[facility] ° °POST OPERATIVE INSTRUCTIONS FOR DR. TROXLER, DR. FOWLER, AND DR. BAKER °[facility] ° ° °1. Take your medication as prescribed.  Pain medication should be taken only as needed. ° °2. Keep the dressing clean, dry and intact. ° °3. Keep your foot elevated above the heart level for the first 48 hours. ° °4. Walking to the bathroom and brief periods of walking are acceptable, unless we have instructed you to be non-weight bearing. ° °5. Always wear your post-op shoe when walking.  Always use your crutches if you are to be non-weight bearing. ° °6. Do not take a shower. Baths are permissible as long as the foot is kept out of the water.  ° °7. Every hour you are awake:  °- Bend your knee 15 times. °- Flex foot 15 times °- Massage calf 15 times ° °8. Call [facility] ([phone]) if any of the following problems occur: °- You develop a temperature or fever. °- The bandage becomes saturated with blood. °- Medication does not stop your pain. °- Injury of the foot occurs. °- Any symptoms of infection including redness, odor, or red streaks running from wound. ° °General Anesthesia, Adult, Care After °This sheet gives you information about how to care for yourself after your procedure. Your health care provider may also give you more specific instructions. If you have problems or questions, contact your health care provider. °What can I expect after the procedure? °After the procedure, the following side effects are common: °· Pain or discomfort at the IV site. °· Nausea. °· Vomiting. °· Sore throat. °· Trouble concentrating. °· Feeling cold or chills. °· Weak or tired. °· Sleepiness and fatigue. °· Soreness and body aches. These side effects can affect parts of the body that were not involved in surgery. °Follow these instructions at home: ° °For at least 24 hours after the procedure: °· Have a responsible adult stay with you. It is  important to have someone help care for you until you are awake and alert. °· Rest as needed. °· Do not: °? Participate in activities in which you could fall or become injured. °? Drive. °? Use heavy machinery. °? Drink alcohol. °? Take sleeping pills or medicines that cause drowsiness. °? Make important decisions or sign legal documents. °? Take care of children on your own. °Eating and drinking °· Follow any instructions from your health care provider about eating or drinking restrictions. °· When you feel hungry, start by eating small amounts of foods that are soft and easy to digest (bland), such as toast. Gradually return to your regular diet. °· Drink enough fluid to keep your urine pale yellow. °· If you vomit, rehydrate by drinking water, juice, or clear broth. °General instructions °· If you have sleep apnea, surgery and certain medicines can increase your risk for breathing problems. Follow instructions from your health care provider about wearing your sleep device: °? Anytime you are sleeping, including during daytime naps. °? While taking prescription pain medicines, sleeping medicines, or medicines that make you drowsy. °· Return to your normal activities as told by your health care provider. Ask your health care provider what activities are safe for you. °· Take over-the-counter and prescription medicines only as told by your health care provider. °· If you smoke, do not smoke without supervision. °· Keep all follow-up visits as told by your health care provider. This is important. °Contact a health care provider if: °· You   have nausea or vomiting that does not get better with medicine. °· You cannot eat or drink without vomiting. °· You have pain that does not get better with medicine. °· You are unable to pass urine. °· You develop a skin rash. °· You have a fever. °· You have redness around your IV site that gets worse. °Get help right away if: °· You have difficulty breathing. °· You have chest  pain. °· You have blood in your urine or stool, or you vomit blood. °Summary °· After the procedure, it is common to have a sore throat or nausea. It is also common to feel tired. °· Have a responsible adult stay with you for the first 24 hours after general anesthesia. It is important to have someone help care for you until you are awake and alert. °· When you feel hungry, start by eating small amounts of foods that are soft and easy to digest (bland), such as toast. Gradually return to your regular diet. °· Drink enough fluid to keep your urine pale yellow. °· Return to your normal activities as told by your health care provider. Ask your health care provider what activities are safe for you. °This information is not intended to replace advice given to you by your health care provider. Make sure you discuss any questions you have with your health care provider. °Document Released: 12/06/2000 Document Revised: 09/02/2017 Document Reviewed: 04/15/2017 °Elsevier Patient Education © 2020 [employer] ° °

## 2019-06-01 LAB — COMPREHENSIVE METABOLIC PANEL
ALT: 21 IU/L (ref 0–32)
AST: 17 IU/L (ref 0–40)
Albumin/Globulin Ratio: 1.7 (ref 1.2–2.2)
Albumin: 4.4 g/dL (ref 3.8–4.8)
Alkaline Phosphatase: 106 IU/L (ref 39–117)
BUN/Creatinine Ratio: 9 — ABNORMAL LOW (ref 12–28)
BUN: 6 mg/dL — ABNORMAL LOW (ref 8–27)
Bilirubin Total: 0.6 mg/dL (ref 0.0–1.2)
CO2: 28 mmol/L (ref 20–29)
Calcium: 9.6 mg/dL (ref 8.7–10.3)
Chloride: 99 mmol/L (ref 96–106)
Creatinine, Ser: 0.66 mg/dL (ref 0.57–1.00)
GFR calc Af Amer: 110 mL/min/{1.73_m2} (ref >59)
GFR calc non Af Amer: 96 mL/min/{1.73_m2} (ref >59)
Globulin, Total: 2.6 g/dL (ref 1.5–4.5)
Glucose: 85 mg/dL (ref 65–99)
Potassium: 3.7 mmol/L (ref 3.5–5.2)
Sodium: 141 mmol/L (ref 134–144)
Total Protein: 7 g/dL (ref 6.0–8.5)

## 2019-06-01 LAB — CBC WITH DIFFERENTIAL/PLATELET
Basophils Absolute: 0.1 10*3/uL (ref 0.0–0.2)
Basos: 1 %
EOS (ABSOLUTE): 0.2 10*3/uL (ref 0.0–0.4)
Eos: 2 %
Hematocrit: 41.2 % (ref 34.0–46.6)
Hemoglobin: 13.7 g/dL (ref 11.1–15.9)
Immature Grans (Abs): 0 10*3/uL (ref 0.0–0.1)
Immature Granulocytes: 1 %
Lymphocytes Absolute: 2.1 10*3/uL (ref 0.7–3.1)
Lymphs: 26 %
MCH: 29.4 pg (ref 26.6–33.0)
MCHC: 33.3 g/dL (ref 31.5–35.7)
MCV: 88 fL (ref 79–97)
Monocytes Absolute: 0.6 10*3/uL (ref 0.1–0.9)
Monocytes: 8 %
Neutrophils Absolute: 4.9 10*3/uL (ref 1.4–7.0)
Neutrophils: 62 %
Platelets: 327 10*3/uL (ref 150–450)
RBC: 4.66 x10E6/uL (ref 3.77–5.28)
RDW: 12.6 % (ref 11.7–15.4)
WBC: 7.8 10*3/uL (ref 3.4–10.8)

## 2019-06-01 LAB — MICROSCOPIC EXAMINATION: WBC, UA: NONE SEEN /hpf (ref 0–5)

## 2019-06-01 LAB — UA/M W/RFLX CULTURE, ROUTINE
Bilirubin, UA: NEGATIVE
Glucose, UA: NEGATIVE
Ketones, UA: NEGATIVE
Leukocytes,UA: NEGATIVE
Nitrite, UA: NEGATIVE
Protein,UA: NEGATIVE
Specific Gravity, UA: 1.01 (ref 1.005–1.030)
Urobilinogen, Ur: 0.2 mg/dL (ref 0.2–1.0)
pH, UA: 7 (ref 5.0–7.5)

## 2019-06-01 NOTE — Assessment & Plan Note (Signed)
BPs consistently elevated, will add amlodipine and recheck early next week. Script given for home monitor so she can be following readings at home. Continue telmisartan.

## 2019-06-04 ENCOUNTER — Other Ambulatory Visit
Admission: RE | Admit: 2019-06-04 | Discharge: 2019-06-04 | Disposition: A | Discharge status: Home / Self Care | Payer: 59 | Source: Ambulatory Visit | Attending: Podiatry | Admitting: Podiatry

## 2019-06-04 ENCOUNTER — Ambulatory Visit: Payer: 59 | Admitting: Family Medicine

## 2019-06-04 ENCOUNTER — Encounter: Payer: Self-pay | Admitting: Family Medicine

## 2019-06-04 ENCOUNTER — Other Ambulatory Visit: Payer: Self-pay

## 2019-06-04 VITALS — BP 136/85 | HR 83 | Temp 98.7°F | Ht 65.0 in | Wt 205.0 lb

## 2019-06-04 DIAGNOSIS — M7661 Achilles tendinitis, right leg: Secondary | ICD-10-CM | POA: Diagnosis not present

## 2019-06-04 DIAGNOSIS — M79671 Pain in right foot: Secondary | ICD-10-CM | POA: Insufficient documentation

## 2019-06-04 DIAGNOSIS — M7731 Calcaneal spur, right foot: Secondary | ICD-10-CM | POA: Insufficient documentation

## 2019-06-04 DIAGNOSIS — Z01812 Encounter for preprocedural laboratory examination: Secondary | ICD-10-CM | POA: Diagnosis present

## 2019-06-04 DIAGNOSIS — M216X1 Other acquired deformities of right foot: Secondary | ICD-10-CM | POA: Diagnosis not present

## 2019-06-04 DIAGNOSIS — Z20828 Contact with and (suspected) exposure to other viral communicable diseases: Secondary | ICD-10-CM | POA: Insufficient documentation

## 2019-06-04 DIAGNOSIS — M2142 Flat foot [pes planus] (acquired), left foot: Secondary | ICD-10-CM | POA: Diagnosis not present

## 2019-06-04 DIAGNOSIS — I129 Hypertensive chronic kidney disease with stage 1 through stage 4 chronic kidney disease, or unspecified chronic kidney disease: Secondary | ICD-10-CM

## 2019-06-04 LAB — SARS CORONAVIRUS 2 (TAT 6-24 HRS): SARS Coronavirus 2: NEGATIVE

## 2019-06-04 NOTE — Assessment & Plan Note (Signed)
BPs improved with addition of amlodipine. Home monitoring strongly recommended, call if elevated readings at home or having any side effects. Cleared for surgery from our standpoint.

## 2019-06-04 NOTE — Progress Notes (Signed)
BP 136/85 (BP Location: Left Arm, Patient Position: Sitting, Cuff Size: Normal)   Pulse 83   Temp 98.7 F (37.1 C) (Oral)   Ht 5\' 5"  (1.651 m)   Wt 205 lb (93 kg)   SpO2 95%   BMI 34.11 kg/m    Subjective:    Patient ID: Meagan York, female    DOB: 09-04-1956, 63 y.o.   MRN: [id number]  HPI: Meagan York is a 63 y.o. female  Chief Complaint  Patient presents with  . Hypertension   Patient here today for 4 day f/u HTN after adding amlodipine to telmisartan regimen. Needed pre-op clearance but BPs were elevated when she presented last week. Has not yet been checking home readings but has script to get device. Tolerating medicine well without side effects. Denies CP, SOB, HAs, dizziness. Scheduled for surgery with [facility] in 3 days.   Relevant past medical, surgical, family and social history reviewed and updated as indicated. Interim medical history since our last visit reviewed. Allergies and medications reviewed and updated.  Review of Systems  Per HPI unless specifically indicated above     Objective:    BP 136/85 (BP Location: Left Arm, Patient Position: Sitting, Cuff Size: Normal)   Pulse 83   Temp 98.7 F (37.1 C) (Oral)   Ht 5\' 5"  (1.651 m)   Wt 205 lb (93 kg)   SpO2 95%   BMI 34.11 kg/m   Wt Readings from Last 3 Encounters:  06/04/19 205 lb (93 kg)  05/31/19 205 lb (93 kg)  11/19/18 200 lb (90.7 kg)    Physical Exam Vitals signs and nursing note reviewed.  Constitutional:      Appearance: Normal appearance. She is not ill-appearing.  HENT:     Head: Atraumatic.  Eyes:     Extraocular Movements: Extraocular movements intact.     Conjunctiva/sclera: Conjunctivae normal.  Neck:     Musculoskeletal: Normal range of motion and neck supple.  Cardiovascular:     Rate and Rhythm: Normal rate and regular rhythm.     Heart sounds: Normal heart sounds.  Pulmonary:     Effort: Pulmonary effort is normal.     Breath sounds: Normal breath  sounds.  Musculoskeletal: Normal range of motion.  Skin:    General: Skin is warm and dry.  Neurological:     Mental Status: She is alert and oriented to person, place, and time.  Psychiatric:        Mood and Affect: Mood normal.        Thought Content: Thought content normal.        Judgment: Judgment normal.     Results for orders placed or performed in visit on 05/31/19  Microscopic Examination   URINE  Result Value Ref Range   WBC, UA None seen 0 - 5 /hpf   RBC 0-2 0 - 2 /hpf   Epithelial Cells (non renal) 0-10 0 - 10 /hpf   Bacteria, UA Few (A) None seen/Few  CBC with Differential/Platelet  Result Value Ref Range   WBC 7.8 3.4 - 10.8 x10E3/uL   RBC 4.66 3.77 - 5.28 x10E6/uL   Hemoglobin 13.7 11.1 - 15.9 g/dL   Hematocrit 41.2 34.0 - 46.6 %   MCV 88 79 - 97 fL   MCH 29.4 26.6 - 33.0 pg   MCHC 33.3 31.5 - 35.7 g/dL   RDW 12.6 11.7 - 15.4 %   Platelets 327 150 - 450 x10E3/uL  Neutrophils 62 Not Estab. %   Lymphs 26 Not Estab. %   Monocytes 8 Not Estab. %   Eos 2 Not Estab. %   Basos 1 Not Estab. %   Neutrophils Absolute 4.9 1.4 - 7.0 x10E3/uL   Lymphocytes Absolute 2.1 0.7 - 3.1 x10E3/uL   Monocytes Absolute 0.6 0.1 - 0.9 x10E3/uL   EOS (ABSOLUTE) 0.2 0.0 - 0.4 x10E3/uL   Basophils Absolute 0.1 0.0 - 0.2 x10E3/uL   Immature Granulocytes 1 Not Estab. %   Immature Grans (Abs) 0.0 0.0 - 0.1 x10E3/uL  Comprehensive metabolic panel  Result Value Ref Range   Glucose 85 65 - 99 mg/dL   BUN 6 (L) 8 - 27 mg/dL   Creatinine, Ser 0.66 0.57 - 1.00 mg/dL   GFR calc non Af Amer 96 >59 mL/min/1.73   GFR calc Af Amer 110 >59 mL/min/1.73   BUN/Creatinine Ratio 9 (L) 12 - 28   Sodium 141 134 - 144 mmol/L   Potassium 3.7 3.5 - 5.2 mmol/L   Chloride 99 96 - 106 mmol/L   CO2 28 20 - 29 mmol/L   Calcium 9.6 8.7 - 10.3 mg/dL   Total Protein 7.0 6.0 - 8.5 g/dL   Albumin 4.4 3.8 - 4.8 g/dL   Globulin, Total 2.6 1.5 - 4.5 g/dL   Albumin/Globulin Ratio 1.7 1.2 - 2.2   Bilirubin  Total 0.6 0.0 - 1.2 mg/dL   Alkaline Phosphatase 106 39 - 117 IU/L   AST 17 0 - 40 IU/L   ALT 21 0 - 32 IU/L  UA/M w/rflx Culture, Routine   Specimen: Urine   URINE  Result Value Ref Range   Specific Gravity, UA 1.010 1.005 - 1.030   pH, UA 7.0 5.0 - 7.5   Color, UA Yellow Yellow   Appearance Ur Clear Clear   Leukocytes,UA Negative Negative   Protein,UA Negative Negative/Trace   Glucose, UA Negative Negative   Ketones, UA Negative Negative   RBC, UA Trace (A) Negative   Bilirubin, UA Negative Negative   Urobilinogen, Ur 0.2 0.2 - 1.0 mg/dL   Nitrite, UA Negative Negative   Microscopic Examination See below:       Assessment & Plan:   Problem List Items Addressed This Visit      Genitourinary   Benign hypertensive renal disease - Primary    BPs improved with addition of amlodipine. Home monitoring strongly recommended, call if elevated readings at home or having any side effects. Cleared for surgery from our standpoint.           Follow up plan: Return for as scheduled.

## 2019-06-07 ENCOUNTER — Ambulatory Visit: Payer: 59 | Admitting: Anesthesiology

## 2019-06-07 ENCOUNTER — Encounter
Admission: RE | Disposition: A | Discharge status: Home / Self Care | Payer: Self-pay | Source: Home / Self Care | Attending: Podiatry

## 2019-06-07 ENCOUNTER — Other Ambulatory Visit: Payer: Self-pay

## 2019-06-07 ENCOUNTER — Ambulatory Visit
Admission: RE | Admit: 2019-06-07 | Discharge: 2019-06-07 | Disposition: A | Discharge status: Home / Self Care | Payer: 59 | Attending: Podiatry | Admitting: Podiatry

## 2019-06-07 DIAGNOSIS — Z791 Long term (current) use of non-steroidal anti-inflammatories (NSAID): Secondary | ICD-10-CM | POA: Diagnosis not present

## 2019-06-07 DIAGNOSIS — M7661 Achilles tendinitis, right leg: Secondary | ICD-10-CM | POA: Diagnosis present

## 2019-06-07 DIAGNOSIS — J449 Chronic obstructive pulmonary disease, unspecified: Secondary | ICD-10-CM | POA: Diagnosis not present

## 2019-06-07 DIAGNOSIS — M25774 Osteophyte, right foot: Secondary | ICD-10-CM | POA: Insufficient documentation

## 2019-06-07 DIAGNOSIS — Z853 Personal history of malignant neoplasm of breast: Secondary | ICD-10-CM | POA: Insufficient documentation

## 2019-06-07 DIAGNOSIS — F17210 Nicotine dependence, cigarettes, uncomplicated: Secondary | ICD-10-CM | POA: Diagnosis not present

## 2019-06-07 DIAGNOSIS — Z79899 Other long term (current) drug therapy: Secondary | ICD-10-CM | POA: Insufficient documentation

## 2019-06-07 DIAGNOSIS — Z7952 Long term (current) use of systemic steroids: Secondary | ICD-10-CM | POA: Insufficient documentation

## 2019-06-07 DIAGNOSIS — Z6833 Body mass index (BMI) 33.0-33.9, adult: Secondary | ICD-10-CM | POA: Diagnosis not present

## 2019-06-07 DIAGNOSIS — I10 Essential (primary) hypertension: Secondary | ICD-10-CM | POA: Diagnosis not present

## 2019-06-07 HISTORY — DX: Emphysema, unspecified: J43.9

## 2019-06-07 HISTORY — PX: CALCANEAL OSTEOTOMY: SHX1281

## 2019-06-07 HISTORY — DX: Essential (primary) hypertension: I10

## 2019-06-07 HISTORY — PX: ACHILLES TENDON SURGERY: SHX542

## 2019-06-07 HISTORY — DX: Unspecified asthma, uncomplicated: J45.909

## 2019-06-07 HISTORY — DX: Headache, unspecified: R51.9

## 2019-06-07 SURGERY — REPAIR, TENDON, ACHILLES
Anesthesia: General | Site: Foot | Laterality: Right

## 2019-06-07 MED ORDER — CEFAZOLIN SODIUM-DEXTROSE 2-4 GM/100ML-% IV SOLN
2.0000 g | INTRAVENOUS | Status: AC
  Administered 2019-06-07: 2 g via INTRAVENOUS

## 2019-06-07 MED ORDER — LACTATED RINGERS IV SOLN
INTRAVENOUS | Status: DC
  Administered 2019-06-07: 07:00:00 via INTRAVENOUS

## 2019-06-07 MED ORDER — ONDANSETRON HCL 4 MG/2ML IJ SOLN: 4.0000 mg | Freq: Once | INTRAMUSCULAR | Status: DC | PRN

## 2019-06-07 MED ORDER — PHENYLEPHRINE HCL (PRESSORS) 10 MG/ML IV SOLN
INTRAVENOUS | Status: DC | PRN
  Administered 2019-06-07: 100 ug via INTRAVENOUS
  Administered 2019-06-07: 200 ug via INTRAVENOUS
  Administered 2019-06-07 (×2): 100 ug via INTRAVENOUS

## 2019-06-07 MED ORDER — PROPOFOL 500 MG/50ML IV EMUL
INTRAVENOUS | Status: DC | PRN
  Administered 2019-06-07: 25 ug/kg/min via INTRAVENOUS

## 2019-06-07 MED ORDER — DIPHENHYDRAMINE HCL 50 MG/ML IJ SOLN
INTRAMUSCULAR | Status: DC | PRN
  Administered 2019-06-07: 12.5 mg via INTRAVENOUS

## 2019-06-07 MED ORDER — ACETAMINOPHEN 325 MG PO TABS: 325.0000 mg | ORAL_TABLET | ORAL | Status: DC | PRN

## 2019-06-07 MED ORDER — ROPIVACAINE HCL 5 MG/ML IJ SOLN
INTRAMUSCULAR | Status: DC | PRN
  Administered 2019-06-07: 40 mL via EPIDURAL

## 2019-06-07 MED ORDER — LIDOCAINE-EPINEPHRINE 1 %-1:100000 IJ SOLN
INTRAMUSCULAR | Status: DC | PRN
  Administered 2019-06-07: 6 mL

## 2019-06-07 MED ORDER — ACETAMINOPHEN 160 MG/5ML PO SOLN: 325.0000 mg | ORAL | Status: DC | PRN

## 2019-06-07 MED ORDER — DEXAMETHASONE SODIUM PHOSPHATE 10 MG/ML IJ SOLN
INTRAMUSCULAR | Status: DC | PRN
  Administered 2019-06-07: 8 mg via INTRAVENOUS

## 2019-06-07 MED ORDER — FENTANYL CITRATE (PF) 100 MCG/2ML IJ SOLN
INTRAMUSCULAR | Status: DC | PRN
  Administered 2019-06-07 (×2): 50 ug via INTRAVENOUS

## 2019-06-07 MED ORDER — MIDAZOLAM HCL 5 MG/5ML IJ SOLN
INTRAMUSCULAR | Status: DC | PRN
  Administered 2019-06-07: 1 mg via INTRAVENOUS
  Administered 2019-06-07: 2 mg via INTRAVENOUS

## 2019-06-07 MED ORDER — KETAMINE HCL 50 MG/ML IJ SOLN
INTRAMUSCULAR | Status: DC | PRN
  Administered 2019-06-07: 25 mg via INTRAMUSCULAR

## 2019-06-07 MED ORDER — OXYCODONE HCL 5 MG PO TABS: 5.0000 mg | ORAL_TABLET | Freq: Once | ORAL | Status: DC | PRN

## 2019-06-07 MED ORDER — POVIDONE-IODINE 7.5 % EX SOLN
Freq: Once | CUTANEOUS | Status: AC
  Administered 2019-06-07: 08:00:00 via TOPICAL

## 2019-06-07 MED ORDER — OXYCODONE HCL 5 MG PO TABS: 5.0000 mg | ORAL_TABLET | ORAL | 0 refills | Status: DC | PRN

## 2019-06-07 MED ORDER — ONDANSETRON HCL 4 MG PO TABS: 4.0000 mg | ORAL_TABLET | Freq: Four times a day (QID) | ORAL | 0 refills | Status: DC | PRN

## 2019-06-07 MED ORDER — OXYCODONE HCL 5 MG/5ML PO SOLN: 5.0000 mg | Freq: Once | ORAL | Status: DC | PRN

## 2019-06-07 MED ORDER — FENTANYL CITRATE (PF) 100 MCG/2ML IJ SOLN: 25.0000 ug | INTRAMUSCULAR | Status: DC | PRN

## 2019-06-07 SURGICAL SUPPLY — 42 items
ANCH SUT 2 2.9 2 LD TPR NDL (Anchor) ×1 IMPLANT
ANCHOR JUGGERKNOT WTAP NDL 2.9 (Anchor) ×1 IMPLANT
BANDAGE ELASTIC 4 VELCRO NS (GAUZE/BANDAGES/DRESSINGS) ×2 IMPLANT
BIT DRILL JUGRKNT W/NDL BIT2.9 (DRILL) IMPLANT
BNDG CMPR 75X41 PLY HI ABS (GAUZE/BANDAGES/DRESSINGS) ×1
BNDG ESMARK 4X12 TAN STRL LF (GAUZE/BANDAGES/DRESSINGS) ×2 IMPLANT
BNDG GAUZE 4.5X4.1 6PLY STRL (MISCELLANEOUS) ×2 IMPLANT
BNDG STRETCH 4X75 STRL LF (GAUZE/BANDAGES/DRESSINGS) ×2 IMPLANT
CANISTER SUCT 1200ML W/VALVE (MISCELLANEOUS) ×2 IMPLANT
COVER LIGHT HANDLE UNIVERSAL (MISCELLANEOUS) ×4 IMPLANT
CUFF TOURN SGL QUICK 30 (TOURNIQUET CUFF) ×2
CUFF TOURN SGL QUICK 34 (TOURNIQUET CUFF) ×2
CUFF TRNQT CYL 30X4X21-28X (TOURNIQUET CUFF) ×1 IMPLANT
CUFF TRNQT CYL 34X4.125X (TOURNIQUET CUFF) IMPLANT
DRILL JUGGERKNOT W/NDL BIT 2.9 (DRILL) ×2
DURAPREP 26ML APPLICATOR (WOUND CARE) ×2 IMPLANT
ELECT REM PT RETURN 9FT ADLT (ELECTROSURGICAL) ×2
ELECTRODE REM PT RTRN 9FT ADLT (ELECTROSURGICAL) ×1 IMPLANT
GAUZE SPONGE 4X4 12PLY STRL (GAUZE/BANDAGES/DRESSINGS) ×2 IMPLANT
GAUZE XEROFORM 1X8 LF (GAUZE/BANDAGES/DRESSINGS) ×2 IMPLANT
GLOVE BIO SURGEON STRL SZ8 (GLOVE) ×3 IMPLANT
GLOVE BIOGEL PI IND STRL 7.5 (GLOVE) IMPLANT
GLOVE BIOGEL PI INDICATOR 7.5 (GLOVE) ×1
GOWN STRL REUS W/ TWL LRG LVL3 (GOWN DISPOSABLE) ×1 IMPLANT
GOWN STRL REUS W/ TWL XL LVL3 (GOWN DISPOSABLE) ×1 IMPLANT
GOWN STRL REUS W/TWL LRG LVL3 (GOWN DISPOSABLE) ×2
GOWN STRL REUS W/TWL XL LVL3 (GOWN DISPOSABLE) ×2
KIT TURNOVER KIT A (KITS) ×2 IMPLANT
NS IRRIG 500ML POUR BTL (IV SOLUTION) ×2 IMPLANT
PACK EXTREMITY ARMC (MISCELLANEOUS) ×2 IMPLANT
PAD CAST CTTN 4X4 STRL (SOFTGOODS) IMPLANT
PADDING CAST BLEND 4X4 NS (MISCELLANEOUS) ×6 IMPLANT
PADDING CAST COTTON 4X4 STRL (SOFTGOODS) ×4
PENCIL SMOKE EVACUATOR (MISCELLANEOUS) ×2 IMPLANT
SPLINT CAST 1 STEP 4X30 (MISCELLANEOUS) ×2 IMPLANT
SPLINT FAST PLASTER 5X30 (CAST SUPPLIES) ×1
SPLINT PLASTER CAST FAST 5X30 (CAST SUPPLIES) ×1 IMPLANT
STOCKINETTE STRL 6IN 960660 (GAUZE/BANDAGES/DRESSINGS) ×2 IMPLANT
STRIP CLOSURE SKIN 1/4X4 (GAUZE/BANDAGES/DRESSINGS) ×2 IMPLANT
SUT VIC AB 2-0 SH 27 (SUTURE) ×2
SUT VIC AB 2-0 SH 27XBRD (SUTURE) IMPLANT
SUT VIC AB 4-0 FS2 27 (SUTURE) ×2 IMPLANT

## 2019-06-07 NOTE — Anesthesia Postprocedure Evaluation (Signed)
Anesthesia Post Note  Patient: Meagan York  Procedure(s) Performed: ACHILLES REPAIR SECONDARY RIGHT (Right Foot) PARTIAL CALCANECTOMY (Right Foot)  Patient location during evaluation: PACU Anesthesia Type: General Level of consciousness: awake and alert and oriented Pain management: satisfactory to patient Vital Signs Assessment: post-procedure vital signs reviewed and stable Respiratory status: spontaneous breathing, nonlabored ventilation and respiratory function stable Cardiovascular status: blood pressure returned to baseline and stable Postop Assessment: Adequate PO intake and No signs of nausea or vomiting Anesthetic complications: no    Raliegh Ip

## 2019-06-07 NOTE — Progress Notes (Signed)
Assisted Clance Boll ANMD with right, ultrasound guided, popliteal, adductor canal block. Side rails up, monitors on throughout procedure. See vital signs in flow sheet. Tolerated Procedure well.

## 2019-06-07 NOTE — Anesthesia Procedure Notes (Signed)
Anesthesia Regional Block: Adductor canal block   Pre-Anesthetic Checklist: ,, timeout performed, Correct Patient, Correct Site, Correct Laterality, Correct Procedure, Correct Position, site marked, Risks and benefits discussed,  Surgical consent,  Pre-op evaluation,  At surgeon's request and post-op pain management  Laterality: Right  Prep: chloraprep       Needles:  Injection technique: Single-shot  Needle Type: Echogenic Needle     Needle Length: 9cm  Needle Gauge: 21     Additional Needles:   Procedures:,,,, ultrasound used (permanent image in chart),,,,  Narrative:  Start time: 06/07/2019 8:49 AM End time: 06/07/2019 8:56 AM Injection made incrementally with aspirations every 5 mL.  Performed by: Personally  Anesthesiologist: Ronelle Nigh, MD  Additional Notes: Functioning IV was confirmed and monitors applied. Ultrasound guidance: relevant anatomy identified, needle position confirmed, local anesthetic spread visualized around nerve(s)., vascular puncture avoided.  Image printed for medical record.  Negative aspiration and no paresthesias; incremental administration of local anesthetic. The patient tolerated the procedure well. Vitals signes recorded in RN notes. 15 ml

## 2019-06-07 NOTE — Anesthesia Preprocedure Evaluation (Addendum)
Anesthesia Evaluation  Patient identified by MRN, date of birth, ID band Patient awake    Reviewed: Allergy & Precautions, H&P , NPO status , Patient's Chart, lab work & pertinent test results  Airway Mallampati: II  TM Distance: >3 FB Neck ROM: full    Dental no notable dental hx.    Pulmonary COPD,  COPD inhaler, Current SmokerPatient did not abstain from smoking.,    Pulmonary exam normal breath sounds clear to auscultation       Cardiovascular hypertension, Normal cardiovascular exam Rhythm:regular Rate:Normal     Neuro/Psych    GI/Hepatic   Endo/Other  Morbid obesity  Renal/GU      Musculoskeletal   Abdominal   Peds  Hematology   Anesthesia Other Findings   Reproductive/Obstetrics                             Anesthesia Physical Anesthesia Plan  ASA: III  Anesthesia Plan: General ETT   Post-op Pain Management:  Regional for Post-op pain   Induction:   PONV Risk Score and Plan: 2 and Ondansetron, Dexamethasone, Midazolam and Treatment may vary due to age or medical condition  Airway Management Planned:   Additional Equipment:   Intra-op Plan:   Post-operative Plan:   Informed Consent: I have reviewed the patients History and Physical, chart, labs and discussed the procedure including the risks, benefits and alternatives for the proposed anesthesia with the patient or authorized representative who has indicated his/her understanding and acceptance.       Plan Discussed with: CRNA  Anesthesia Plan Comments:        Anesthesia Quick Evaluation

## 2019-06-07 NOTE — Op Note (Signed)
Operative note   Surgeon: Dr. Albertine Patricia, DPM.    Assistant: None    Preop diagnosis: 1.  Chronic Achilles tendinitis right heel 2.  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  Secondary repair Achilles tendon with juggernaut 2.9 tendon anchor   2.  Excision of retrocalcaneal exostosis and Achilles tendon calcification right heel        EBL: LESS than 5 cc    Anesthesia:IV sedation and a popliteal block delivered by the anesthesia team.  I put in 6 cc of lidocaine with epinephrine at the end of the case.    Hemostasis: Thigh tourniquet at 3 mmHg pressure for 61 minutes    Specimen: Degenerative tendon and bone from posterior right heel    Complications: None    Operative indications: Chronic pain unresponsive to conservative treatment    Procedure:  Patient was brought into the OR and placed on the operating table in theprone position. After anesthesia was obtained theright lower extremity was prepped and draped in usual sterile fashion.  Operative Report: This time attention directed the posterior right heel where a 4 cm linear incision was made midline to the posterior calcaneus.  This was deepened with sharp blunt dissection bleeders clamped and bovied as required.  Deeper layers were carefully dissected to include the deep superficial fascia as well as the tendon tendon sheath of the Achilles tendon.  Once these were reflected medial laterally incision was made through the tendon down to bone.  The tendon was then reflected medial laterally away from the bone proliferation on the posterior right heel.  There was a partially attached but moving piece of calcification within the tendon this was removed with dissection and power equipment.  The remaining bone proliferation and spurring was removed with power equipment as well as with a power rasping curettage.  The dorsal posterior calcaneal area was then  palpated seem to be prominent this was resected medially and laterally and rasped with power equipment as well.  Once we got Nitrol bone on the posterior aspect of the bone there was copiously irrigated.  A 2.9 juggernaut anchor was placed in at this timeframe.  This was used to anchor the tendon back down to the posterior calcaneus.  Other tendon to soft tissue portions were reapproximated with 2-0 Vicryl simple erupted sutures.  Irrigated copiously irrigated throughout the procedure.  The peritenon and tendon sheath was then closed with 4-0 Vicryl continuous stitch.  Deep superficial fascial layers were closed with 4-0 Vicryl in continuous stitch.  Skin was closed with 4-0 Vicryl subcuticular fashion.  There is a block with lidocaine with epinephrine a sterile compressive dressing was placed across wound assisting of Steri-Strips Xeroform gauze 4 x 4's Kling and Kerlix.  The patient was then placed in a posterior splint with the foot in a plantarflexed position.    Patient tolerated the procedure and anesthesia well.  Was transported from the OR to the PACU with all vital signs stable and vascular status intact. To be discharged per routine protocol.  Will follow up in approximately 1 week in the outpatient clinic.

## 2019-06-07 NOTE — Anesthesia Procedure Notes (Signed)
Procedure Name: General with mask airway Performed by: Izetta Dakin, CRNA Pre-anesthesia Checklist: Patient identified, Emergency Drugs available, Suction available, Patient being monitored and Timeout performed Patient Re-evaluated:Patient Re-evaluated prior to induction Oxygen Delivery Method: Non-rebreather mask Preoxygenation: Pre-oxygenation with 100% oxygen Induction Type: IV induction

## 2019-06-07 NOTE — Anesthesia Procedure Notes (Signed)
Anesthesia Regional Block: Popliteal block   Pre-Anesthetic Checklist: ,, timeout performed, Correct Patient, Correct Site, Correct Laterality, Correct Procedure, Correct Position, site marked, Risks and benefits discussed,  Surgical consent,  Pre-op evaluation,  At surgeon's request and post-op pain management  Laterality: Right  Prep: chloraprep       Needles:  Injection technique: Single-shot  Needle Type: Echogenic Needle     Needle Length: 9cm  Needle Gauge: 21     Additional Needles:   Procedures:,,,, ultrasound used (permanent image in chart),,,,  Narrative:  Start time: 06/07/2019 7:49 AM End time: 06/07/2019 7:56 AM Injection made incrementally with aspirations every 5 mL.  Performed by: Personally  Anesthesiologist: Ronelle Nigh, MD  Additional Notes: Functioning IV was confirmed and monitors applied. Ultrasound guidance: relevant anatomy identified, needle position confirmed, local anesthetic spread visualized around nerve(s)., vascular puncture avoided.  Image printed for medical record.  Negative aspiration and no paresthesias; incremental administration of local anesthetic. The patient tolerated the procedure well. Vitals signes recorded in RN notes. 25 ml

## 2019-06-07 NOTE — Transfer of Care (Signed)
Immediate Anesthesia Transfer of Care Note  Patient: Meagan York  Procedure(s) Performed: ACHILLES REPAIR SECONDARY RIGHT (Right Foot) PARTIAL CALCANECTOMY (Right Foot)  Patient Location: PACU  Anesthesia Type: General ETT  Level of Consciousness: awake, alert  and patient cooperative  Airway and Oxygen Therapy: Patient Spontanous Breathing and Patient connected to supplemental oxygen  Post-op Assessment: Post-op Vital signs reviewed, Patient's Cardiovascular Status Stable, Respiratory Function Stable, Patent Airway and No signs of Nausea or vomiting  Post-op Vital Signs: Reviewed and stable  Complications: No apparent anesthesia complications

## 2019-06-07 NOTE — H&P (Signed)
H and P has been reviewed and no changes are noted.  

## 2019-06-11 LAB — SURGICAL PATHOLOGY

## 2019-06-24 ENCOUNTER — Other Ambulatory Visit: Payer: Self-pay | Admitting: Family Medicine

## 2019-07-05 ENCOUNTER — Encounter: Payer: 59 | Admitting: Family Medicine

## 2019-07-13 ENCOUNTER — Telehealth: Payer: Self-pay | Admitting: *Deleted

## 2019-07-13 NOTE — Telephone Encounter (Signed)
Left message for patient to notify them that it is time to schedule annual low dose lung cancer screening CT scan. Instructed patient to call back to verify information prior to the scan being scheduled.  

## 2019-10-18 ENCOUNTER — Telehealth: Payer: Self-pay | Admitting: Family Medicine

## 2019-10-18 ENCOUNTER — Other Ambulatory Visit: Payer: Self-pay | Admitting: Family Medicine

## 2019-10-18 ENCOUNTER — Encounter: Payer: Self-pay | Admitting: Family Medicine

## 2019-10-18 ENCOUNTER — Ambulatory Visit (INDEPENDENT_AMBULATORY_CARE_PROVIDER_SITE_OTHER): Payer: 59 | Admitting: Family Medicine

## 2019-10-18 ENCOUNTER — Other Ambulatory Visit: Payer: Self-pay

## 2019-10-18 VITALS — BP 147/94 | HR 105 | Temp 98.3°F | Wt 206.0 lb

## 2019-10-18 DIAGNOSIS — L089 Local infection of the skin and subcutaneous tissue, unspecified: Secondary | ICD-10-CM | POA: Diagnosis not present

## 2019-10-18 LAB — CBC WITH DIFFERENTIAL/PLATELET
Hematocrit: 40.4 % (ref 34.0–46.6)
Hemoglobin: 13.7 g/dL (ref 11.1–15.9)
Lymphocytes Absolute: 2.2 10*3/uL (ref 0.7–3.1)
Lymphs: 25 %
MCH: 31.8 pg (ref 26.6–33.0)
MCHC: 33.9 g/dL (ref 31.5–35.7)
MCV: 94 fL (ref 79–97)
MID (Absolute): 0.7 10*3/uL (ref 0.1–1.6)
MID: 8 %
Neutrophils Absolute: 5.8 10*3/uL (ref 1.4–7.0)
Neutrophils: 67 %
Platelets: 310 10*3/uL (ref 150–450)
RBC: 4.31 x10E6/uL (ref 3.77–5.28)
RDW: 13 % (ref 11.7–15.4)
WBC: 8.7 10*3/uL (ref 3.4–10.8)

## 2019-10-18 MED ORDER — SULFAMETHOXAZOLE-TRIMETHOPRIM 800-160 MG PO TABS: 1.0000 | ORAL_TABLET | Freq: Two times a day (BID) | ORAL | 0 refills | Status: DC

## 2019-10-18 MED ORDER — HYDROCODONE-ACETAMINOPHEN 5-325 MG PO TABS: 1.0000 | ORAL_TABLET | Freq: Two times a day (BID) | ORAL | 0 refills | Status: DC | PRN

## 2019-10-18 NOTE — Telephone Encounter (Signed)
Copied from [place] [id number]. Topic: General - Other >> Oct 18, 2019  2:07 PM Keene Breath wrote: Reason for CRM: Patient asked if the doctor would send her some pain medication to her local pharmacy.  Please respond at [phone]

## 2019-10-18 NOTE — Telephone Encounter (Signed)
Sent in 10 tabs, she should use them sparingly

## 2019-10-18 NOTE — Progress Notes (Signed)
BP (!) 147/94   Pulse (!) 105   Temp 98.3 F (36.8 C) (Oral)   Wt 206 lb (93.4 kg)   SpO2 97%   BMI 34.28 kg/m    Subjective:    Patient ID: Meagan York, female    DOB: 1955-11-27, 64 y.o.   MRN: [id number]  HPI: ILIANNA York is a 64 y.o. female  Chief Complaint  Patient presents with  . Rash    right foot,  2 weeks ago. states that the rash started after she got heel surgery   Patient presenting today with painful rash of right foot that's been ongoing for several weeks. Seemed to start up after her surgery through Podiatry to address her chronic right heel pain. That area seems to be healing well, but on bottom of foot having blisters, pustules, and significant pain. Also having some redness. Now also having a few places pop up on right palm and itching all over. No medication changes since surgery, fevers, chills, body aches, sweats. Applying neosporin at times to areas, trying to keep clean and covered.   Relevant past medical, surgical, family and social history reviewed and updated as indicated. Interim medical history since our last visit reviewed. Allergies and medications reviewed and updated.  Review of Systems  Per HPI unless specifically indicated above     Objective:    BP (!) 147/94   Pulse (!) 105   Temp 98.3 F (36.8 C) (Oral)   Wt 206 lb (93.4 kg)   SpO2 97%   BMI 34.28 kg/m   Wt Readings from Last 3 Encounters:  10/18/19 206 lb (93.4 kg)  06/07/19 200 lb (90.7 kg)  06/04/19 205 lb (93 kg)    Physical Exam Vitals and nursing note reviewed.  Constitutional:      Appearance: Normal appearance. She is not ill-appearing.  HENT:     Head: Atraumatic.  Eyes:     Extraocular Movements: Extraocular movements intact.     Conjunctiva/sclera: Conjunctivae normal.  Cardiovascular:     Rate and Rhythm: Normal rate and regular rhythm.     Pulses: Normal pulses.     Heart sounds: Normal heart sounds.  Pulmonary:     Effort: Pulmonary effort is  normal.     Breath sounds: Normal breath sounds.  Musculoskeletal:        General: No swelling (trace edema right foot). Normal range of motion.     Cervical back: Normal range of motion and neck supple.  Skin:    General: Skin is warm.     Findings: Erythema (right foot plantar surface) present.     Comments: Ulcerations and pustules present on plantar surface of right foot distally with some surrounding ttp and erythema. No streaking or erythema spreading to remainder of foot or up into ankle  Several ulcerations present on right palm additionally  Neurological:     Mental Status: She is alert and oriented to person, place, and time.  Psychiatric:        Mood and Affect: Mood normal.        Thought Content: Thought content normal.        Judgment: Judgment normal.     Results for orders placed or performed in visit on 10/18/19  CBC With Differential/Platelet  Result Value Ref Range   WBC 8.7 3.4 - 10.8 x10E3/uL   RBC 4.31 3.77 - 5.28 x10E6/uL   Hemoglobin 13.7 11.1 - 15.9 g/dL   Hematocrit 40.4 34.0 - 46.6 %  MCV 94 79 - 97 fL   MCH 31.8 26.6 - 33.0 pg   MCHC 33.9 31.5 - 35.7 g/dL   RDW 13.0 11.7 - 15.4 %   Platelets 310 150 - 450 x10E3/uL   Neutrophils 67 Not Estab. %   Lymphs 25 Not Estab. %   MID 8 Not Estab. %   Neutrophils Absolute 5.8 1.4 - 7.0 x10E3/uL   Lymphocytes Absolute 2.2 0.7 - 3.1 x10E3/uL   MID (Absolute) 0.7 0.1 - 1.6 X10E3/uL      Assessment & Plan:   Problem List Items Addressed This Visit    None    Visit Diagnoses    Right foot infection    -  Primary   Will start bactrim, continue neosporin. Keep areas covered, clean and elevate legs to avoid swelling. F/u in 1 week for recheck. Check CBC today   Relevant Medications   sulfamethoxazole-trimethoprim (BACTRIM DS) 800-160 MG tablet   Other Relevant Orders   CBC With Differential/Platelet (Completed)       Follow up plan: Return in about 1 week (around 10/25/2019) for Right foot wound check.

## 2019-10-18 NOTE — Telephone Encounter (Signed)
Patient notified

## 2019-10-26 ENCOUNTER — Ambulatory Visit: Payer: 59 | Admitting: Family Medicine

## 2019-11-02 ENCOUNTER — Other Ambulatory Visit: Payer: Self-pay

## 2019-11-02 ENCOUNTER — Encounter: Payer: Self-pay | Admitting: Emergency Medicine

## 2019-11-02 ENCOUNTER — Emergency Department: Payer: 59

## 2019-11-02 ENCOUNTER — Inpatient Hospital Stay
Admission: EM | Admit: 2019-11-02 | Discharge: 2019-11-04 | DRG: 066 | Disposition: A | Discharge status: Home / Self Care | Payer: 59 | Attending: Hospitalist | Admitting: Hospitalist

## 2019-11-02 ENCOUNTER — Ambulatory Visit: Payer: Self-pay

## 2019-11-02 DIAGNOSIS — Z800 Family history of malignant neoplasm of digestive organs: Secondary | ICD-10-CM

## 2019-11-02 DIAGNOSIS — Z886 Allergy status to analgesic agent status: Secondary | ICD-10-CM

## 2019-11-02 DIAGNOSIS — I6389 Other cerebral infarction: Principal | ICD-10-CM | POA: Diagnosis present

## 2019-11-02 DIAGNOSIS — J439 Emphysema, unspecified: Secondary | ICD-10-CM | POA: Diagnosis present

## 2019-11-02 DIAGNOSIS — R29898 Other symptoms and signs involving the musculoskeletal system: Secondary | ICD-10-CM | POA: Diagnosis not present

## 2019-11-02 DIAGNOSIS — Z885 Allergy status to narcotic agent status: Secondary | ICD-10-CM

## 2019-11-02 DIAGNOSIS — I639 Cerebral infarction, unspecified: Secondary | ICD-10-CM | POA: Diagnosis present

## 2019-11-02 DIAGNOSIS — Z823 Family history of stroke: Secondary | ICD-10-CM

## 2019-11-02 DIAGNOSIS — Z7951 Long term (current) use of inhaled steroids: Secondary | ICD-10-CM

## 2019-11-02 DIAGNOSIS — Z8249 Family history of ischemic heart disease and other diseases of the circulatory system: Secondary | ICD-10-CM

## 2019-11-02 DIAGNOSIS — F17210 Nicotine dependence, cigarettes, uncomplicated: Secondary | ICD-10-CM | POA: Diagnosis present

## 2019-11-02 DIAGNOSIS — Z853 Personal history of malignant neoplasm of breast: Secondary | ICD-10-CM

## 2019-11-02 DIAGNOSIS — Z820 Family history of epilepsy and other diseases of the nervous system: Secondary | ICD-10-CM

## 2019-11-02 DIAGNOSIS — R2990 Unspecified symptoms and signs involving the nervous system: Secondary | ICD-10-CM

## 2019-11-02 DIAGNOSIS — Z79899 Other long term (current) drug therapy: Secondary | ICD-10-CM

## 2019-11-02 DIAGNOSIS — Z923 Personal history of irradiation: Secondary | ICD-10-CM

## 2019-11-02 DIAGNOSIS — I10 Essential (primary) hypertension: Secondary | ICD-10-CM | POA: Diagnosis present

## 2019-11-02 DIAGNOSIS — K219 Gastro-esophageal reflux disease without esophagitis: Secondary | ICD-10-CM | POA: Diagnosis present

## 2019-11-02 DIAGNOSIS — Z825 Family history of asthma and other chronic lower respiratory diseases: Secondary | ICD-10-CM

## 2019-11-02 DIAGNOSIS — M1990 Unspecified osteoarthritis, unspecified site: Secondary | ICD-10-CM | POA: Diagnosis present

## 2019-11-02 DIAGNOSIS — Z833 Family history of diabetes mellitus: Secondary | ICD-10-CM

## 2019-11-02 DIAGNOSIS — Z20822 Contact with and (suspected) exposure to covid-19: Secondary | ICD-10-CM | POA: Diagnosis present

## 2019-11-02 DIAGNOSIS — G8321 Monoplegia of upper limb affecting right dominant side: Secondary | ICD-10-CM | POA: Diagnosis present

## 2019-11-02 DIAGNOSIS — Z79891 Long term (current) use of opiate analgesic: Secondary | ICD-10-CM

## 2019-11-02 DIAGNOSIS — R200 Anesthesia of skin: Secondary | ICD-10-CM | POA: Diagnosis present

## 2019-11-02 LAB — DIFFERENTIAL
Abs Immature Granulocytes: 0.04 10*3/uL (ref 0.00–0.07)
Basophils Absolute: 0.1 10*3/uL (ref 0.0–0.1)
Basophils Relative: 1 %
Eosinophils Absolute: 0.1 10*3/uL (ref 0.0–0.5)
Eosinophils Relative: 2 %
Immature Granulocytes: 1 %
Lymphocytes Relative: 25 %
Lymphs Abs: 2 10*3/uL (ref 0.7–4.0)
Monocytes Absolute: 0.6 10*3/uL (ref 0.1–1.0)
Monocytes Relative: 8 %
Neutro Abs: 5.1 10*3/uL (ref 1.7–7.7)
Neutrophils Relative %: 63 %

## 2019-11-02 LAB — COMPREHENSIVE METABOLIC PANEL
ALT: 34 U/L (ref 0–44)
AST: 29 U/L (ref 15–41)
Albumin: 3.6 g/dL (ref 3.5–5.0)
Alkaline Phosphatase: 86 U/L (ref 38–126)
Anion gap: 10 (ref 5–15)
BUN: 6 mg/dL — ABNORMAL LOW (ref 8–23)
CO2: 28 mmol/L (ref 22–32)
Calcium: 8.7 mg/dL — ABNORMAL LOW (ref 8.9–10.3)
Chloride: 98 mmol/L (ref 98–111)
Creatinine, Ser: 0.59 mg/dL (ref 0.44–1.00)
GFR calc Af Amer: >60 mL/min (ref >60)
GFR calc non Af Amer: >60 mL/min (ref >60)
Glucose, Bld: 100 mg/dL — ABNORMAL HIGH (ref 70–99)
Potassium: 3.5 mmol/L (ref 3.5–5.1)
Sodium: 136 mmol/L (ref 135–145)
Total Bilirubin: 0.9 mg/dL (ref 0.3–1.2)
Total Protein: 7.4 g/dL (ref 6.5–8.1)

## 2019-11-02 LAB — LIPID PANEL
Cholesterol: 222 mg/dL — ABNORMAL HIGH (ref 0–200)
HDL: 58 mg/dL (ref >40)
LDL Cholesterol: 140 mg/dL — ABNORMAL HIGH (ref 0–99)
Total CHOL/HDL Ratio: 3.8 RATIO
Triglycerides: 119 mg/dL (ref <150)
VLDL: 24 mg/dL (ref 0–40)

## 2019-11-02 LAB — CBC
HCT: 39.5 % (ref 36.0–46.0)
Hemoglobin: 12.7 g/dL (ref 12.0–15.0)
MCH: 30.4 pg (ref 26.0–34.0)
MCHC: 32.2 g/dL (ref 30.0–36.0)
MCV: 94.5 fL (ref 80.0–100.0)
Platelets: 307 10*3/uL (ref 150–400)
RBC: 4.18 MIL/uL (ref 3.87–5.11)
RDW: 11.9 % (ref 11.5–15.5)
WBC: 7.8 10*3/uL (ref 4.0–10.5)
nRBC: 0 % (ref 0.0–0.2)

## 2019-11-02 LAB — PROTIME-INR
INR: 0.9 (ref 0.8–1.2)
Prothrombin Time: 12.3 seconds (ref 11.4–15.2)

## 2019-11-02 LAB — TSH: TSH: 2.357 u[IU]/mL (ref 0.350–4.500)

## 2019-11-02 LAB — APTT: aPTT: 29 seconds (ref 24–36)

## 2019-11-02 MED ORDER — NICOTINE 21 MG/24HR TD PT24: 21.0000 mg | MEDICATED_PATCH | Freq: Every day | TRANSDERMAL | Status: DC

## 2019-11-02 MED ORDER — ALBUTEROL SULFATE HFA 108 (90 BASE) MCG/ACT IN AERS
1.0000 | INHALATION_SPRAY | RESPIRATORY_TRACT | Status: DC | PRN
  Filled 2019-11-02: qty 6.7

## 2019-11-02 MED ORDER — SODIUM CHLORIDE 0.9% FLUSH
3.0000 mL | Freq: Two times a day (BID) | INTRAVENOUS | Status: DC
  Administered 2019-11-02 – 2019-11-03 (×3): 3 mL via INTRAVENOUS

## 2019-11-02 MED ORDER — CLOPIDOGREL BISULFATE 75 MG PO TABS
75.0000 mg | ORAL_TABLET | Freq: Every day | ORAL | Status: DC
  Administered 2019-11-02 – 2019-11-04 (×3): 75 mg via ORAL
  Filled 2019-11-02 (×3): qty 1

## 2019-11-02 MED ORDER — TIOTROPIUM BROMIDE MONOHYDRATE 18 MCG IN CAPS
1.0000 | ORAL_CAPSULE | Freq: Every day | RESPIRATORY_TRACT | Status: DC
  Administered 2019-11-03 – 2019-11-04 (×2): 18 ug via RESPIRATORY_TRACT
  Filled 2019-11-02: qty 5

## 2019-11-02 MED ORDER — ENOXAPARIN SODIUM 40 MG/0.4ML ~~LOC~~ SOLN
40.0000 mg | SUBCUTANEOUS | Status: DC
  Administered 2019-11-02 – 2019-11-03 (×2): 40 mg via SUBCUTANEOUS
  Filled 2019-11-02 (×2): qty 0.4

## 2019-11-02 MED ORDER — AMLODIPINE BESYLATE 5 MG PO TABS
5.0000 mg | ORAL_TABLET | Freq: Every day | ORAL | Status: DC
  Administered 2019-11-03 – 2019-11-04 (×2): 5 mg via ORAL
  Filled 2019-11-02 (×2): qty 1

## 2019-11-02 MED ORDER — OXYCODONE HCL 5 MG PO TABS
5.0000 mg | ORAL_TABLET | Freq: Once | ORAL | Status: AC
  Administered 2019-11-02: 17:00:00 5 mg via ORAL
  Filled 2019-11-02: qty 1

## 2019-11-02 MED ORDER — IRBESARTAN 150 MG PO TABS
300.0000 mg | ORAL_TABLET | Freq: Every day | ORAL | Status: DC
  Administered 2019-11-03 – 2019-11-04 (×2): 300 mg via ORAL
  Filled 2019-11-02 (×2): qty 2

## 2019-11-02 MED ORDER — PANTOPRAZOLE SODIUM 40 MG PO TBEC
40.0000 mg | DELAYED_RELEASE_TABLET | Freq: Two times a day (BID) | ORAL | Status: DC
  Administered 2019-11-02 – 2019-11-04 (×4): 40 mg via ORAL
  Filled 2019-11-02 (×4): qty 1

## 2019-11-02 MED ORDER — FLUTICASONE FUROATE-VILANTEROL 200-25 MCG/INH IN AEPB
1.0000 | INHALATION_SPRAY | Freq: Every day | RESPIRATORY_TRACT | Status: DC
  Filled 2019-11-02: qty 28

## 2019-11-02 NOTE — ED Notes (Signed)
This RN attempted to call report.  

## 2019-11-02 NOTE — ED Notes (Signed)
First nurse note: pt c/o R arm numbness x2 days

## 2019-11-02 NOTE — ED Provider Notes (Signed)
[facility] Emergency Department Provider Note       Time seen: ----------------------------------------- 4:26 PM on 11/02/2019 -----------------------------------------   I have reviewed the triage vital signs and the nursing notes.  HISTORY   Chief Complaint Arm Pain    HPI Meagan York is a 64 y.o. female with a history of arthritis, asthma, COPD, GERD, headache, hypertension, IBS who presents to the ED for right arm weakness and numbness.  Patient states is been going on about 2 days.  She has decreased strength in her hand as well.  She sleeps on her right side, thinks it may be related to that.  She currently smokes, denies change in her vision or swallowing or any other neurologic deficits.  Past Medical History:  Diagnosis Date  . Arthritis   . Asthma   . Bilateral breast cysts   . 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 Complication of anesthesia    pt woke up during breast surgery (1995)  . COPD (chronic obstructive pulmonary disease) ([place])   . Emphysema lung ([place])   . GERD (gastroesophageal reflux disease)   . Headache    sinus  . Hemorrhoids   . Hypertension   . Irritable bowel disease   . 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 Pap smear abnormality of cervix/human papillomavirus (HPV) positive 07/03/2018  . Class 1 obesity due to excess calories with serious comorbidity and body mass index (BMI) of 33.0 to 33.9 in adult 04/20/2018  . 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 Tobacco abuse 11/24/2015  . Heel pain, bilateral 10/24/2015  . Arthritis   . COPD (chronic obstructive pulmonary disease) ([place])   . GERD (gastroesophageal reflux disease)   . Upper GI bleed 07/05/2015  . History of breast cancer 04/25/2015    Past Surgical History:  Procedure Laterality Date  . ACHILLES TENDON SURGERY  Right 06/07/2019   Procedure: ACHILLES REPAIR SECONDARY RIGHT;  Surgeon: Albertine Patricia, DPM;  Location: [place];  Service: Podiatry;  Laterality: Right;  general with local  . APPENDECTOMY    . BREAST BIOPSY Right 01-01-15   BIPHASIC FIBROEPITHELIAL LESION with ADH  . BREAST LUMPECTOMY Right 04/25/2015   Procedure: RIGHT BREAST LUMPECTOMY, exicision of skin tag right face;  Surgeon: Christene Lye, MD;  Location: [facility];  Service: General;  Laterality: Right;  . BREAST MAMMOSITE Right 05-21-15  . BREAST SURGERY Left    cyst removal  . CALCANEAL OSTEOTOMY Right 06/07/2019   Procedure: PARTIAL CALCANECTOMY;  Surgeon: Albertine Patricia, DPM;  Location: [facility];  Service: Podiatry;  Laterality: Right;  . COLONOSCOPY WITH PROPOFOL N/A 07/07/2015   Procedure: COLONOSCOPY WITH PROPOFOL;  Surgeon: Josefine Class, MD;  Location[facility] ENDOSCOPY;  Service: Endoscopy;  Laterality: N/A;  . COLONOSCOPY WITH PROPOFOL N/A 06/12/2018   Procedure: COLONOSCOPY WITH PROPOFOL;  Surgeon: Lin Landsman, MD;  Location: [facility] ENDOSCOPY;  Service: Gastroenterology;  Laterality: N/A;  . cyst lower spine    . ESOPHAGOGASTRODUODENOSCOPY (EGD) WITH PROPOFOL N/A 07/07/2015   Procedure: ESOPHAGOGASTRODUODENOSCOPY (EGD) WITH PROPOFOL;  Surgeon: Josefine Class, MD;  Location: [facility] ENDOSCOPY;  Service: Endoscopy;  Laterality: N/A;  . ESOPHAGOGASTRODUODENOSCOPY (EGD) WITH PROPOFOL N/A 06/12/2018   Procedure: ESOPHAGOGASTRODUODENOSCOPY (EGD) WITH PROPOFOL;  Surgeon: Lin Landsman, MD;  Location: [facility]COPY;  Service: Gastroenterology;  Laterality: N/A;  .  KNEE SURGERY    . piondial cyst excision  03/1989    Allergies Acetaminophen, Aspirin, Oxycodone-acetaminophen, and Tramadol  Social History Social History   Tobacco Use  . Smoking status: Current Every Day Smoker    Packs/day: 1.00    Years: 44.00    Pack years: 44.00    Types: Cigarettes  . Smokeless tobacco:  Never Used  . Tobacco comment: about 1/2 ppd  Substance Use Topics  . Alcohol use: No    Alcohol/week: 0.0 standard drinks  . Drug use: No    Review of Systems Constitutional: Negative for fever. Cardiovascular: Negative for chest pain. Respiratory: Negative for shortness of breath. Gastrointestinal: Negative for abdominal pain, vomiting and diarrhea. Musculoskeletal: Negative for back pain. Skin: Negative for rash. Neurological: Positive for right arm weakness and numbness  All systems negative/normal/unremarkable except as stated in the HPI  ____________________________________________   PHYSICAL EXAM:  VITAL SIGNS: ED Triage Vitals  Enc Vitals Group     BP 11/02/19 1308 (!) 175/102     Pulse Rate 11/02/19 1308 (!) 103     Resp 11/02/19 1308 20     Temp 11/02/19 1308 98.1 F (36.7 C)     Temp Source 11/02/19 1308 Oral     SpO2 11/02/19 1308 97 %     Weight 11/02/19 1309 200 lb (90.7 kg)     Height 11/02/19 1309 5\' 5"  (1.651 m)     Head Circumference --      Peak Flow --      Pain Score 11/02/19 1309 0     Pain Loc --      Pain Edu? --      Excl. in [place]? --     Constitutional: Alert and oriented. Well appearing and in no distress. Eyes: Conjunctivae are normal. Normal extraocular movements. ENT      Head: Normocephalic and atraumatic.      Nose: No congestion/rhinnorhea.      Mouth/Throat: Mucous membranes are moist.      Neck: No stridor. Cardiovascular: Normal rate, regular rhythm. No murmurs, rubs, or gallops. Respiratory: Normal respiratory effort without tachypnea nor retractions. Breath sounds are clear and equal bilaterally. No wheezes/rales/rhonchi. Gastrointestinal: Soft and nontender. Normal bowel sounds Musculoskeletal: Nontender with normal range of motion in extremities. No lower extremity tenderness nor edema. Neurologic:  Normal speech and language.  Right arm weakness and decreased sensation compared to the left.  Intrinsic hand muscle weakness  as well.  Cranial nerves appear to be intact.  Normal strength and sensation in the lower extremities. Skin:  Skin is warm, dry and intact. No rash noted. Psychiatric: Mood and affect are normal. Speech and behavior are normal.  ____________________________________________  EKG: Interpreted by me.  Sinus tachycardia with PACs, rate is 105 bpm, otherwise normal EKG, normal QT  ____________________________________________  ED COURSE:  As part of my medical decision making, I reviewed the following data within the [place] History obtained from family if available, nursing notes, old chart and ekg, as well as notes from prior ED visits. Patient presented for right arm weakness and numbness, we will assess with labs and imaging as indicated at this time.   Procedures  Meagan York was evaluated in Emergency Department on 11/02/2019 for the symptoms described in the history of present illness. She was evaluated in the context of the global COVID-19 pandemic, which necessitated consideration that the patient might be at risk for infection with the SARS-CoV-2 virus that causes COVID-19.  Institutional protocols and algorithms that pertain to the evaluation of patients at risk for COVID-19 are in a state of rapid change based on information released by regulatory bodies including the CDC and federal and state organizations. These policies and algorithms were followed during the patient's care in the ED.  ____________________________________________   LABS (pertinent positives/negatives)  Labs Reviewed  COMPREHENSIVE METABOLIC PANEL - Abnormal; Notable for the following components:      Result Value   Glucose, Bld 100 (*)    BUN 6 (*)    Calcium 8.7 (*)    All other components within normal limits  SARS CORONAVIRUS 2 (TAT 6-24 HRS)  PROTIME-INR  APTT  CBC  DIFFERENTIAL  CBG MONITORING, ED    RADIOLOGY Images were viewed by me  CT head IMPRESSION: No acute  intracranial hemorrhage or mass effect. 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 MRI could be obtained for further evaluation. ____________________________________________   DIFFERENTIAL DIAGNOSIS   CVA, TIA, neuropathy, vascular disease  FINAL ASSESSMENT AND PLAN  CVA   Plan: The patient had presented for right arm weakness and numbness.  Symptoms started 2 days ago.  Patient's labs are unremarkable. Patient's imaging did reveal age-indeterminate small vessel infarcts of the right basal ganglia and left corona radiata.  She will need MRI and full stroke work-up with hospital admission and neurology consultation.    Laurence Aly, MD    Note: This note was generated in part or whole with voice recognition software. Voice recognition is usually quite accurate but there are transcription errors that can and very often do occur. I apologize for any typographical errors that were not detected and corrected.     Earleen Newport, MD 11/02/19 [phone]

## 2019-11-02 NOTE — ED Triage Notes (Addendum)
Pt presents to ED via POV with c/o R arm numbness. Pt states arm has been numb x several days. Pt states normally sleeps on R side. Pt also c/o R arm weakness, states "all it wants to do is hang".   Pt with noted R arm weakness, R side grip weakness, and R arm drift. No other neuro deficits noted at this time.

## 2019-11-02 NOTE — H&P (Signed)
History and Physical    Meagan York [id number] DOB: Oct 21, 1955 DOA: 11/02/2019  PCP: Valerie Roys, DO  Patient coming from: home  I have personally briefly reviewed patient's old medical records in [place]  Chief Complaint: right arm weakness  HPI: Meagan York is a 64 y.o. Caucasian female with medical history significant of HTN, COPD who presented with right arm weakness.   Pt noted weakness and numbness in her right arm for the past 2 days.  At first, she thought it was because she slept on it, so she tried to exercise it to get it back to normal, but she just made her arm sore without improving the strength.  No other neurological complaints.  Never had a stroke in the past.  Both parents had had strokes, but unclear at what age.  Pt has wheezing associated with her COPD (non-complaint with her bronchodilators), and is a current smoker, otherwise pt was at her baseline.  No fever, chest pain, abdominal pain, N/V/D, dysuria, increased swelling.  Normal PO intake, normal ambulation.     ED Course: initial vitals: afebrile, pulse 103, BP 175/102, sating 97% on room air.  Labs unremarkable.  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  Assessment/Plan Active Problems:   Right arm weakness  # Right arm weakness and numbness --Code stroke not called, since symptoms presented for 2 days and no other new neurological deficits developed.   PLAN: --MRI brain --US carotid --A1c, lipid profile, TSH --If MRI brain showed acute infarct, then will consult neuro --No need for PT/OT for now --cardiac monitoring to assess for possible Afib  # HTN --continue home amlodipine and telmisartan (as AVAPRO) since already out of the 48 hours of permissive HTN periods.  # COPD not on home O2 # Current smoker --albuterol PRN. --Will not schedule bronchodilators since  pt doesn't take them regularly at home. --Counseled pt on smoking cessation and how that's a risk factor for stroke.  # GERD --continue home PPI BID   DVT prophylaxis: Lovenox SQ Code Status: Full code  Disposition Plan: Home tomorrow  Consults called: none yet Admission status: Observation   Review of Systems: As per HPI otherwise 10 point review of systems negative.   Past Medical History:  Diagnosis Date  . Arthritis   . Asthma   . Bilateral breast cysts   . 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 Complication of anesthesia    pt woke up during breast surgery (1995)  . COPD (chronic obstructive pulmonary disease) ([place])   . Emphysema lung ([place])   . GERD (gastroesophageal reflux disease)   . Headache    sinus  . Hemorrhoids   . Hypertension   . Irritable bowel disease   . 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 ACHILLES TENDON SURGERY Right 06/07/2019   Procedure: ACHILLES REPAIR SECONDARY RIGHT;  Surgeon: Albertine Patricia, DPM;  Location: [place];  Service: Podiatry;  Laterality: Right;  general with local  . APPENDECTOMY    . BREAST BIOPSY Right 01-01-15   BIPHASIC FIBROEPITHELIAL LESION with ADH  . BREAST LUMPECTOMY Right 04/25/2015   Procedure: RIGHT BREAST LUMPECTOMY, exicision of skin tag right face;  Surgeon: Christene Lye, MD;  Location: [facility];  Service: General;  Laterality: Right;  . BREAST MAMMOSITE Right 05-21-15  . BREAST SURGERY Left    cyst removal  . CALCANEAL OSTEOTOMY Right 06/07/2019   Procedure: PARTIAL CALCANECTOMY;  Surgeon: Albertine Patricia, DPM;  Location: [place];  Service: Podiatry;  Laterality: Right;  . COLONOSCOPY WITH PROPOFOL N/A 07/07/2015   Procedure: COLONOSCOPY WITH PROPOFOL;  Surgeon: Josefine Class, MD;  Location[facility] END[facility]COPY;  Service: Endoscopy;  Laterality: N/A;  .  COLONOSCOPY WITH PROPOFOL N/A 06/12/2018   Procedure: COLONOSCOPY WITH PROPOFOL;  Surgeon: Lin Landsman, MD;  Location: [facility] ENDOSCOPY;  Service: Gastroenterology;  Laterality: N/A;  . cyst lower spine    . ESOPHAGOGASTRODUODENOSCOPY (EGD) WITH PROPOFOL N/A 07/07/2015   Procedure: ESOPHAGOGASTRODUODENOSCOPY (EGD) WITH PROPOFOL;  Surgeon: Josefine Class, MD;  Location[facility] ENDOSCOPY;  Service: Endoscopy;  Laterality: N/A;  . ESOPHAGOGASTRODUODENOSCOPY (EGD) WITH PROPOFOL N/A 06/12/2018   Procedure: ESOPHAGOGASTRODUODENOSCOPY (EGD) WITH PROPOFOL;  Surgeon: Lin Landsman, MD;  Location[facility] ENDOSCOPY;  Service: Gastroenterology;  Laterality: N/A;  . KNEE SURGERY    . piondial cyst excision  03/1989     reports that she has been smoking cigarettes. She has a 44.00 pack-year smoking history. She has never used smokeless tobacco. She reports that she does not drink alcohol or use drugs.  Allergies  Allergen Reactions  . Acetaminophen Nausea And Vomiting  . Aspirin     Bleeding ulcers  . Oxycodone-Acetaminophen Nausea Only  . Tramadol Nausea And Vomiting    Family History  Problem Relation Age of Onset  . Colon cancer Father        age 73  . Anemia Father   . Diabetes Father   . Hypertension Father   . Diverticulosis Mother   . COPD Mother   . Hypertension Brother   . Alzheimer's disease Paternal Grandfather     Prior to Admission medications   Medication Sig Start Date End Date Taking? Authorizing Provider  albuterol (PROAIR HFA) 108 (90 Base) MCG/ACT inhaler Inhale 1-2 puffs into the lungs every 4 (four) hours as needed for wheezing or shortness of breath. 01/18/19   Johnson, Megan P, DO  amLODipine (NORVASC) 5 MG tablet TAKE 1 TABLET BY MOUTH EVERY DAY Patient taking differently: Take 5 mg by mouth daily.  06/24/19   Volney American, PA-C  budesonide-formoterol [facility]) 160-4.5 MCG/ACT inhaler Inhale 2 puffs into the lungs 2 (two) times daily. 01/18/19    Johnson, Megan P, DO  celecoxib (CELEBREX) 200 MG capsule TAKE 1 CAPSULE BY MOUTH EVERY DAY Patient taking differently: Take 200 mg by mouth daily. TAKE 1 CAPSULE BY MOUTH EVERY DAY 10/18/19   Park Liter P, DO  Cholecalciferol (VITAMIN D3) 1000 units CAPS Take 1,000 Units by mouth daily.    [provider]  HYDROcodone-acetaminophen (NORCO/VICODIN) 5-325 MG tablet Take 1 tablet by mouth 2 (two) times daily as needed for moderate pain. 10/18/19   Volney American, PA-C  ondansetron (ZOFRAN) 4 MG tablet Take 1 tablet (4 mg total) by mouth 4 (four) times daily as needed for nausea or vomiting. Patient not taking: Reported on 10/18/2019 06/07/19   Albertine Patricia, DPM  pantoprazole (PROTONIX) 40 MG tablet Take 1 tablet (40 mg total) by mouth 2 (two) times daily. 01/18/19   Valerie Roys, DO  Spacer/Aero-Holding Chambers DEVI 1 each by Does not apply route daily. 09/07/18   Tyler Pita, MD  sulfamethoxazole-trimethoprim (BACTRIM DS) 800-160 MG tablet Take 1 tablet by  mouth 2 (two) times daily. 10/18/19   Volney American, PA-C  telmisartan (MICARDIS) 80 MG tablet Take 1 tablet (80 mg total) by mouth daily. 05/10/19   Johnson, Megan P, DO  Tiotropium Bromide Monohydrate (SPIRIVA RESPIMAT) 2.5 MCG/ACT AERS Inhale 1 puff into the lungs daily. 04/13/19   Valerie Roys, DO    Physical Exam: Vitals:   11/02/19 1308 11/02/19 1309 11/02/19 1743  BP: (!) 175/102  (!) 165/80  Pulse: (!) 103  95  Resp: 20  14  Temp: 98.1 F (36.7 C)  98.1 F (36.7 C)  TempSrc: Oral  Oral  SpO2: 97%  97%  Weight:  90.7 kg   Height:  5\' 5"  (1.651 m)     Constitutional: NAD, AAOx3 HEENT: conjunctivae and lids normal, EOMI CV: RRR no M,R,G. Distal pulses +2.  No cyanosis.   RESP: CTA B/L, normal respiratory effort  GI: +BS, NTND Extremities: No effusions, edema, or tenderness in BLE MSK: normal ROM in right arm, some reduction in strength SKIN: warm, dry and intact Neuro: II - XII grossly  intact.  Sensation intact Psych: Normal mood and affect.  Appropriate judgement and reason   Labs on Admission: I have personally reviewed following labs and imaging studies  CBC: Recent Labs  Lab 11/02/19 1318  WBC 7.8  NEUTROABS 5.1  HGB 12.7  HCT 39.5  MCV 94.5  PLT [id number]   Basic Metabolic Panel: Recent Labs  Lab 11/02/19 1318  NA 136  K 3.5  CL 98  CO2 28  GLUCOSE 100*  BUN 6*  CREATININE 0.59  CALCIUM 8.7*   GFR: Estimated Creatinine Clearance: 82.2 mL/min (by C-G formula based on SCr of 0.59 mg/dL). Liver Function Tests: Recent Labs  Lab 11/02/19 1318  AST 29  ALT 34  ALKPHOS 86  BILITOT 0.9  PROT 7.4  ALBUMIN 3.6   No results for input(s): LIPASE, AMYLASE in the last 168 hours. No results for input(s): AMMONIA in the last 168 hours. Coagulation Profile: Recent Labs  Lab 11/02/19 1318  INR 0.9   Cardiac Enzymes: No results for input(s): CKTOTAL, CKMB, CKMBINDEX, TROPONINI in the last 168 hours. BNP (last 3 results) No results for input(s): PROBNP in the last 8760 hours. HbA1C: No results for input(s): HGBA1C in the last 72 hours. CBG: No results for input(s): GLUCAP in the last 168 hours. Lipid Profile: No results for input(s): CHOL, HDL, LDLCALC, TRIG, CHOLHDL, LDLDIRECT in the last 72 hours. Thyroid Function Tests: No results for input(s): TSH, T4TOTAL, FREET4, T3FREE, THYROIDAB in the last 72 hours. Anemia Panel: No results for input(s): VITAMINB12, FOLATE, FERRITIN, TIBC, IRON, RETICCTPCT in the last 72 hours. Urine analysis:    Component Value Date/Time   COLORURINE STRAW (A) 07/04/2015 2320   APPEARANCEUR Clear 05/31/2019 1520   LABSPEC 1.014 07/04/2015 2320   LABSPEC 1.003 08/07/2012 1409   PHURINE 6.0 07/04/2015 2320   GLUCOSEU Negative 05/31/2019 1520   GLUCOSEU Negative 08/07/2012 1409   HGBUR NEGATIVE 07/04/2015 2320   BILIRUBINUR Negative 05/31/2019 1520   BILIRUBINUR Negative 08/07/2012 [place] 07/04/2015  2320   PROTEINUR Negative 05/31/2019 1520   PROTEINUR NEGATIVE 07/04/2015 2320   NITRITE Negative 05/31/2019 1520   NITRITE NEGATIVE 07/04/2015 2320   LEUKOCYTESUR Negative 05/31/2019 1520   LEUKOCYTESUR Negative 08/07/2012 1409    Radiological Exams on Admission: CT HEAD WO CONTRAST  Result Date: 11/02/2019 CLINICAL DATA:  Right arm numbness EXAM: CT HEAD WITHOUT CONTRAST TECHNIQUE: Contiguous axial images  were obtained from the base of the skull through the vertex without intravenous contrast. COMPARISON:  None. FINDINGS: Brain: There is no acute intracranial hemorrhage, mass-effect, or edema. Gray-white differentiation is preserved. Patchy hypoattenuation in the supratentorial white matter is nonspecific but may reflect mild chronic microvascular ischemic changes. There are age-indeterminate small vessel infarcts of the right basal ganglia and left corona radiata. There is no extra-axial fluid collection. Ventricles and sulci are within normal limits in size and configuration. Vascular: No hyperdense vessel or unexpected calcification. Skull: Calvarium is unremarkable. Sinuses/Orbits: Patchy paranasal sinus opacification. Orbits are unremarkable. Other: Mastoid air cells are clear. IMPRESSION: No acute intracranial hemorrhage or mass effect. 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 MRI could be obtained for further evaluation. Electronically Signed   By: Macy Mis M.D.   On: 11/02/2019 13:51      Enzo Bi MD [facility]  If 7PM-7AM, please contact night-coverage 11/02/2019, 5:57 PM

## 2019-11-02 NOTE — Telephone Encounter (Signed)
Pt. Reports she has had right arm weakness x 2 days. "I thought I had slept on it and that was what was causing this." No numbness, "but it just wants to hang down. I can use it some but then it gets tired and weak and I have to stop what I'm doing." Denies any other symptom. No pain.Instructed to have her family take her to ED now. Verbalizes understanding.  Reason for Disposition . Patient sounds very sick or weak to the triager  Answer Assessment - Initial Assessment Questions 1. SYMPTOM: "What is the main symptom you are concerned about?" (e.g., weakness, numbness)     Right arm weakness 2. ONSET: "When did this start?" (minutes, hours, days; while sleeping)     2 days ago 3. LAST NORMAL: "When was the last time you were normal (no symptoms)?"     This week 4. PATTERN "Does this come and go, or has it been constant since it started?"  "Is it present now?"     Constant 5. CARDIAC SYMPTOMS: "Have you had any of the following symptoms: chest pain, difficulty breathing, palpitations?"     No 6. NEUROLOGIC SYMPTOMS: "Have you had any of the following symptoms: headache, dizziness, vision loss, double vision, changes in speech, unsteady on your feet?"     No 7. OTHER SYMPTOMS: "Do you have any other symptoms?"     No 8. PREGNANCY: "Is there any chance you are pregnant?" "When was your last menstrual period?"     No  Protocols used: NEUROLOGIC DEFICIT-A-AH

## 2019-11-03 ENCOUNTER — Observation Stay: Payer: 59

## 2019-11-03 DIAGNOSIS — Z853 Personal history of malignant neoplasm of breast: Secondary | ICD-10-CM | POA: Diagnosis not present

## 2019-11-03 DIAGNOSIS — Z825 Family history of asthma and other chronic lower respiratory diseases: Secondary | ICD-10-CM | POA: Diagnosis not present

## 2019-11-03 DIAGNOSIS — I639 Cerebral infarction, unspecified: Secondary | ICD-10-CM | POA: Diagnosis not present

## 2019-11-03 DIAGNOSIS — M1990 Unspecified osteoarthritis, unspecified site: Secondary | ICD-10-CM | POA: Diagnosis present

## 2019-11-03 DIAGNOSIS — I6389 Other cerebral infarction: Secondary | ICD-10-CM | POA: Diagnosis present

## 2019-11-03 DIAGNOSIS — Z20822 Contact with and (suspected) exposure to covid-19: Secondary | ICD-10-CM | POA: Diagnosis present

## 2019-11-03 DIAGNOSIS — J439 Emphysema, unspecified: Secondary | ICD-10-CM | POA: Diagnosis present

## 2019-11-03 DIAGNOSIS — R29898 Other symptoms and signs involving the musculoskeletal system: Secondary | ICD-10-CM | POA: Diagnosis present

## 2019-11-03 DIAGNOSIS — Z885 Allergy status to narcotic agent status: Secondary | ICD-10-CM | POA: Diagnosis not present

## 2019-11-03 DIAGNOSIS — Z833 Family history of diabetes mellitus: Secondary | ICD-10-CM | POA: Diagnosis not present

## 2019-11-03 DIAGNOSIS — I10 Essential (primary) hypertension: Secondary | ICD-10-CM | POA: Diagnosis present

## 2019-11-03 DIAGNOSIS — Z923 Personal history of irradiation: Secondary | ICD-10-CM | POA: Diagnosis not present

## 2019-11-03 DIAGNOSIS — Z886 Allergy status to analgesic agent status: Secondary | ICD-10-CM | POA: Diagnosis not present

## 2019-11-03 DIAGNOSIS — G8321 Monoplegia of upper limb affecting right dominant side: Secondary | ICD-10-CM | POA: Diagnosis present

## 2019-11-03 DIAGNOSIS — Z7951 Long term (current) use of inhaled steroids: Secondary | ICD-10-CM | POA: Diagnosis not present

## 2019-11-03 DIAGNOSIS — Z8249 Family history of ischemic heart disease and other diseases of the circulatory system: Secondary | ICD-10-CM | POA: Diagnosis not present

## 2019-11-03 DIAGNOSIS — Z823 Family history of stroke: Secondary | ICD-10-CM | POA: Diagnosis not present

## 2019-11-03 DIAGNOSIS — Z800 Family history of malignant neoplasm of digestive organs: Secondary | ICD-10-CM | POA: Diagnosis not present

## 2019-11-03 DIAGNOSIS — F17210 Nicotine dependence, cigarettes, uncomplicated: Secondary | ICD-10-CM | POA: Diagnosis present

## 2019-11-03 DIAGNOSIS — Z79899 Other long term (current) drug therapy: Secondary | ICD-10-CM | POA: Diagnosis not present

## 2019-11-03 DIAGNOSIS — R200 Anesthesia of skin: Secondary | ICD-10-CM | POA: Diagnosis present

## 2019-11-03 DIAGNOSIS — Z79891 Long term (current) use of opiate analgesic: Secondary | ICD-10-CM | POA: Diagnosis not present

## 2019-11-03 DIAGNOSIS — Z820 Family history of epilepsy and other diseases of the nervous system: Secondary | ICD-10-CM | POA: Diagnosis not present

## 2019-11-03 DIAGNOSIS — K219 Gastro-esophageal reflux disease without esophagitis: Secondary | ICD-10-CM | POA: Diagnosis present

## 2019-11-03 LAB — SARS CORONAVIRUS 2 (TAT 6-24 HRS): SARS Coronavirus 2: NEGATIVE

## 2019-11-03 LAB — HEMOGLOBIN A1C
Hgb A1c MFr Bld: 5.2 % (ref 4.8–5.6)
Mean Plasma Glucose: 102.54 mg/dL

## 2019-11-03 MED ORDER — NICOTINE 21 MG/24HR TD PT24
21.0000 mg | MEDICATED_PATCH | Freq: Every day | TRANSDERMAL | Status: DC
  Administered 2019-11-03 – 2019-11-04 (×3): 21 mg via TRANSDERMAL
  Filled 2019-11-03 (×3): qty 1

## 2019-11-03 MED ORDER — ONDANSETRON 4 MG PO TBDP
4.0000 mg | ORAL_TABLET | Freq: Three times a day (TID) | ORAL | Status: DC | PRN
  Filled 2019-11-03: qty 1

## 2019-11-03 MED ORDER — ATORVASTATIN CALCIUM 20 MG PO TABS
40.0000 mg | ORAL_TABLET | Freq: Every day | ORAL | Status: DC
  Administered 2019-11-03: 40 mg via ORAL
  Filled 2019-11-03: qty 2

## 2019-11-03 MED ORDER — GUAIFENESIN-DM 100-10 MG/5ML PO SYRP: 10.0000 mL | ORAL_SOLUTION | Freq: Four times a day (QID) | ORAL | Status: DC | PRN

## 2019-11-03 MED ORDER — OXYCODONE HCL 5 MG PO TABS
5.0000 mg | ORAL_TABLET | Freq: Once | ORAL | Status: AC
  Administered 2019-11-03: 07:00:00 5 mg via ORAL
  Filled 2019-11-03: qty 1

## 2019-11-03 MED ORDER — CALCIUM CARBONATE ANTACID 500 MG PO CHEW: 1.0000 | CHEWABLE_TABLET | Freq: Three times a day (TID) | ORAL | Status: DC | PRN

## 2019-11-03 MED ORDER — ONDANSETRON HCL 4 MG/2ML IJ SOLN
4.0000 mg | Freq: Four times a day (QID) | INTRAMUSCULAR | Status: DC | PRN
  Administered 2019-11-03: 10:00:00 4 mg via INTRAVENOUS
  Filled 2019-11-03: qty 2

## 2019-11-03 MED ORDER — ACETAMINOPHEN 500 MG PO TABS
1000.0000 mg | ORAL_TABLET | Freq: Three times a day (TID) | ORAL | Status: DC | PRN
  Administered 2019-11-03: 1000 mg via ORAL
  Filled 2019-11-03 (×2): qty 2

## 2019-11-03 MED ORDER — ALUM & MAG HYDROXIDE-SIMETH 200-200-20 MG/5ML PO SUSP
15.0000 mL | Freq: Four times a day (QID) | ORAL | Status: DC | PRN
  Filled 2019-11-03: qty 30

## 2019-11-03 MED ORDER — DOCUSATE SODIUM 100 MG PO CAPS: 100.0000 mg | ORAL_CAPSULE | Freq: Two times a day (BID) | ORAL | Status: DC | PRN

## 2019-11-03 MED ORDER — POLYETHYLENE GLYCOL 3350 17 G PO PACK: 17.0000 g | PACK | Freq: Two times a day (BID) | ORAL | Status: DC | PRN

## 2019-11-03 NOTE — Consult Note (Signed)
Requesting Physician: Billie Ruddy    Chief Complaint: Right upper extremity weakness  I have been asked by Dr. Billie Ruddy to see this patient in consultation for stroke.  HPI: Meagan York is an 64 y.o. female with a history of poorly controlled HTN and tobacco abuse who reports that she awakened on 2/17 with numbness and weakness in her right upper extremity. She felt that she may have slept on it incorrectly and did not seek medical attention for her symptoms until 2/19.    Date last known well: 10/31/2019 Time last known well: Time: 00:30 tPA Given: No: Outside time window  Past Medical History:  Diagnosis Date  . Arthritis   . Asthma   . Bilateral breast cysts   . 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 Complication of anesthesia    pt woke up during breast surgery (1995)  . COPD (chronic obstructive pulmonary disease) ([place])   . Emphysema lung ([place])   . GERD (gastroesophageal reflux disease)   . Headache    sinus  . Hemorrhoids   . Hypertension   . Irritable bowel disease   . 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 ACHILLES TENDON SURGERY Right 06/07/2019   Procedure: ACHILLES REPAIR SECONDARY RIGHT;  Surgeon: Albertine Patricia, DPM;  Location: [place];  Service: Podiatry;  Laterality: Right;  general with local  . APPENDECTOMY    . BREAST BIOPSY Right 01-01-15   BIPHASIC FIBROEPITHELIAL LESION with ADH  . BREAST LUMPECTOMY Right 04/25/2015   Procedure: RIGHT BREAST LUMPECTOMY, exicision of skin tag right face;  Surgeon: Christene Lye, MD;  Location: [facility];  Service: General;  Laterality: Right;  . BREAST MAMMOSITE Right 05-21-15  . BREAST SURGERY Left    cyst removal  . CALCANEAL OSTEOTOMY Right 06/07/2019   Procedure: PARTIAL CALCANECTOMY;  Surgeon: Albertine Patricia, DPM;  Location: [place];  Service: Podiatry;  Laterality:  Right;  . COLONOSCOPY WITH PROPOFOL N/A 07/07/2015   Procedure: COLONOSCOPY WITH PROPOFOL;  Surgeon: Josefine Class, MD;  Location: [facility] ENDOSCOPY;  Service: Endoscopy;  Laterality: N/A;  . COLONOSCOPY WITH PROPOFOL N/A 06/12/2018   Procedure: COLONOSCOPY WITH PROPOFOL;  Surgeon: Lin Landsman, MD;  Location: [facility] ENDOSCOPY;  Service: Gastroenterology;  Laterality: N/A;  . cyst lower spine    . ESOPHAGOGASTRODUODENOSCOPY (EGD) WITH PROPOFOL N/A 07/07/2015   Procedure: ESOPHAGOGASTRODUODENOSCOPY (EGD) WITH PROPOFOL;  Surgeon: Josefine Class, MD;  Location: [facility] ENDOSCOPY;  Service: Endoscopy;  Laterality: N/A;  . ESOPHAGOGASTRODUODENOSCOPY (EGD) WITH PROPOFOL N/A 06/12/2018   Procedure: ESOPHAGOGASTRODUODENOSCOPY (EGD) WITH PROPOFOL;  Surgeon: Lin Landsman, MD;  Location: [facility] ENDOSCOPY;  Service: Gastroenterology;  Laterality: N/A;  . KNEE SURGERY    . piondial cyst excision  03/1989    Family History  Problem Relation Age of Onset  . Colon cancer Father        age 45  . Anemia Father   . Diabetes Father   . Hypertension Father   . Diverticulosis Mother   . COPD Mother   . Hypertension Brother   . Alzheimer's disease Paternal Grandfather    Social History:  reports that she has been smoking cigarettes. She has a 44.00 pack-year smoking history. She has never used smokeless tobacco. She reports that she does not drink alcohol or use drugs.  Allergies:  Allergies  Allergen Reactions  .  Acetaminophen Nausea And Vomiting  . Aspirin     Bleeding ulcers  . Oxycodone-Acetaminophen Nausea Only  . Tramadol Nausea And Vomiting    Medications:  I have reviewed the patient's current medications. Prior to Admission:  Medications Prior to Admission  Medication Sig Dispense Refill Last Dose  . budesonide-formoterol (SYMBICORT) 160-4.5 MCG/ACT inhaler Inhale 2 puffs into the lungs 2 (two) times daily. 1 Inhaler 11 11/01/2019 at 2200  . celecoxib (CELEBREX) 200 MG  capsule TAKE 1 CAPSULE BY MOUTH EVERY DAY (Patient taking differently: Take 200 mg by mouth daily. TAKE 1 CAPSULE BY MOUTH EVERY DAY) 90 capsule 1 11/02/2019 at 1000  . Cholecalciferol (VITAMIN D3) 1000 units CAPS Take 1,000 Units by mouth daily.   11/02/2019 at 1000  . HYDROcodone-acetaminophen (NORCO/VICODIN) 5-325 MG tablet Take 1 tablet by mouth 2 (two) times daily as needed for moderate pain. 10 tablet 0 Past Week at Unknown time  . pantoprazole (PROTONIX) 40 MG tablet Take 1 tablet (40 mg total) by mouth 2 (two) times daily. 180 tablet 1 11/02/2019 at 1000  . telmisartan (MICARDIS) 80 MG tablet Take 1 tablet (80 mg total) by mouth daily. 90 tablet 1 11/02/2019 at 1000  . Tiotropium Bromide Monohydrate (SPIRIVA RESPIMAT) 2.5 MCG/ACT AERS Inhale 1 puff into the lungs daily. 4 g 6 11/01/2019 at Unknown time  . albuterol (PROAIR HFA) 108 (90 Base) MCG/ACT inhaler Inhale 1-2 puffs into the lungs every 4 (four) hours as needed for wheezing or shortness of breath. 18 g 6 unknown at prn  . amLODipine (NORVASC) 5 MG tablet TAKE 1 TABLET BY MOUTH EVERY DAY (Patient taking differently: Take 5 mg by mouth daily. ) 30 tablet 0   . Spacer/Aero-Holding Chambers DEVI 1 each by Does not apply route daily. 1 each 0    Scheduled: . amLODipine  5 mg Oral Daily  . atorvastatin  40 mg Oral q1800  . clopidogrel  75 mg Oral Daily  . enoxaparin (LOVENOX) injection  40 mg Subcutaneous Q24H  . fluticasone furoate-vilanterol  1 puff Inhalation Daily  . irbesartan  300 mg Oral Daily  . nicotine  21 mg Transdermal Daily  . pantoprazole  40 mg Oral BID  . sodium chloride flush  3 mL Intravenous Q12H  . tiotropium  1 capsule Inhalation Daily    ROS: History obtained from the patient  General ROS: negative for - chills, fatigue, fever, night sweats, weight gain or weight loss Psychological ROS: negative for - behavioral disorder, hallucinations, memory difficulties, mood swings or suicidal ideation Ophthalmic ROS:  negative for - blurry vision, double vision, eye pain or loss of vision ENT ROS: negative for - epistaxis, nasal discharge, oral lesions, sore throat, tinnitus or vertigo Allergy and Immunology ROS: negative for - hives or itchy/watery eyes Hematological and Lymphatic ROS: negative for - bleeding problems, bruising or swollen lymph nodes Endocrine ROS: negative for - galactorrhea, hair pattern changes, polydipsia/polyuria or temperature intolerance Respiratory ROS: negative for - cough, hemoptysis, shortness of breath or wheezing Cardiovascular ROS: negative for - chest pain, dyspnea on exertion, edema or irregular heartbeat Gastrointestinal ROS: negative for - abdominal pain, diarrhea, hematemesis, nausea/vomiting or stool incontinence Genito-Urinary ROS: negative for - dysuria, hematuria, incontinence or urinary frequency/urgency Musculoskeletal ROS: right leg weakness from surgery Neurological ROS: as noted in HPI Dermatological ROS: negative for rash and skin lesion changes  Physical Examination: Blood pressure (!) 147/84, pulse 87, temperature (!) 97.5 F (36.4 C), temperature source Oral, resp. rate 18, height 5'  5" (1.651 m), weight 92.7 kg, SpO2 98 %.  HEENT-  Normocephalic, no lesions, without obvious abnormality.  Normal external eye and conjunctiva.  Normal TM's bilaterally.  Normal auditory canals and external ears. Normal external nose, mucus membranes and septum.  Normal pharynx. Cardiovascular- S1, S2 normal, pulses palpable throughout   Lungs- chest clear, no wheezing, rales, normal symmetric air entry Abdomen- soft, non-tender; bowel sounds normal; no masses,  no organomegaly Extremities- no edema Lymph-no adenopathy palpable Musculoskeletal-no joint tenderness, deformity or swelling Skin-warm and dry, no hyperpigmentation, vitiligo, or suspicious lesions  Neurological Examination   Mental Status: Alert, oriented, thought content appropriate.  Speech fluent without  evidence of aphasia.  Able to follow 3 step commands without difficulty. Cranial Nerves: II: Discs flat bilaterally; Visual fields grossly normal, pupils equal, round, reactive to light and accommodation III,IV, VI: ptosis not present, extra-ocular motions intact bilaterally V,VII: smile symmetric, facial light touch sensation normal bilaterally VIII: hearing normal bilaterally IX,X: gag reflex present XI: bilateral shoulder shrug XII: midline tongue extension Motor: Right : Upper extremity   5-/5    Left:     Upper extremity   5/5  Lower extremity   5/5     Lower extremity   5/5 Tone and bulk:normal tone throughout; no atrophy noted Sensory: Pinprick and light touch intact throughout, bilaterally Deep Tendon Reflexes: Symmetric throughout Plantars: Right: downgoing   Left: downgoing Cerebellar: Normal finger-to-nose and normal heel-to-shin testing bilaterally Gait: not tested due to safety concerns   Laboratory Studies:  Basic Metabolic Panel: Recent Labs  Lab 11/02/19 1318  NA 136  K 3.5  CL 98  CO2 28  GLUCOSE 100*  BUN 6*  CREATININE 0.59  CALCIUM 8.7*    Liver Function Tests: Recent Labs  Lab 11/02/19 1318  AST 29  ALT 34  ALKPHOS 86  BILITOT 0.9  PROT 7.4  ALBUMIN 3.6   No results for input(s): LIPASE, AMYLASE in the last 168 hours. No results for input(s): AMMONIA in the last 168 hours.  CBC: Recent Labs  Lab 11/02/19 1318  WBC 7.8  NEUTROABS 5.1  HGB 12.7  HCT 39.5  MCV 94.5  PLT 307    Cardiac Enzymes: No results for input(s): CKTOTAL, CKMB, CKMBINDEX, TROPONINI in the last 168 hours.  BNP: Invalid input(s): POCBNP  CBG: No results for input(s): GLUCAP in the last 168 hours.  Microbiology: Results for orders placed or performed during the hospital encounter of 11/02/19  SARS CORONAVIRUS 2 (TAT 6-24 HRS) Nasopharyngeal Nasopharyngeal Swab     Status: None   Collection Time: 11/02/19  4:31 PM   Specimen: Nasopharyngeal Swab  Result  Value Ref Range Status   SARS Coronavirus 2 NEGATIVE NEGATIVE Final    Comment: (NOTE) SARS-CoV-2 target nucleic acids are NOT DETECTED. The SARS-CoV-2 RNA is generally detectable in upper and lower respiratory specimens during the acute phase of infection. Negative results do not preclude SARS-CoV-2 infection, do not rule out co-infections with other pathogens, and should not be used as the sole basis for treatment or other patient management decisions. Negative results must be combined with clinical observations, patient history, and epidemiological information. The expected result is Negative. Fact Sheet for Patients: SugarRoll.be Fact Sheet for Healthcare Providers: https://www.woods-mathews.com/ This test is not yet approved or cleared by the [place] FDA and  has been authorized for detection and/or diagnosis of SARS-CoV-2 by FDA under an Emergency Use Authorization (EUA). This EUA will remain  in effect (meaning this test can be  used) for the duration of the COVID-19 declaration under Section 56 4(b)(1) of the Act, 21 U.S.C. section 360bbb-3(b)(1), unless the authorization is terminated or revoked sooner. Performed at [facility], [place] [address] [place], [place] [address]     Coagulation Studies: Recent Labs    11/02/19 1318  LABPROT 12.3  INR 0.9    Urinalysis: No results for input(s): COLORURINE, LABSPEC, PHURINE, GLUCOSEU, HGBUR, BILIRUBINUR, KETONESUR, PROTEINUR, UROBILINOGEN, NITRITE, LEUKOCYTESUR in the last 168 hours.  Invalid input(s): APPERANCEUR  Lipid Panel:    Component Value Date/Time   CHOL 222 (H) 11/02/2019 1318   CHOL 204 (H) 06/26/2018 1049   TRIG 119 11/02/2019 1318   HDL 58 11/02/2019 1318   HDL 51 06/26/2018 1049   CHOLHDL 3.8 11/02/2019 1318   VLDL 24 11/02/2019 1318   LDLCALC 140 (H) 11/02/2019 1318   LDLCALC 123 (H) 06/26/2018 1049    HgbA1C:  Lab Results  Component Value Date    HGBA1C 5.2 11/02/2019    Urine Drug Screen:  No results found for: LABOPIA, COCAINSCRNUR, LABBENZ, AMPHETMU, THCU, LABBARB  Alcohol Level: No results for input(s): ETH in the last 168 hours.  Other results: EKG: sinus tachycardia at 105 bpm with premature atrial complexes.  Imaging: CT HEAD WO CONTRAST  Result Date: 11/02/2019 CLINICAL DATA:  Right arm numbness EXAM: CT HEAD WITHOUT CONTRAST TECHNIQUE: Contiguous axial images were obtained from the base of the skull through the vertex without intravenous contrast. COMPARISON:  None. FINDINGS: Brain: There is no acute intracranial hemorrhage, mass-effect, or edema. Gray-white differentiation is preserved. Patchy hypoattenuation in the supratentorial white matter is nonspecific but may reflect mild chronic microvascular ischemic changes. There are age-indeterminate small vessel infarcts of the right basal ganglia and left corona radiata. There is no extra-axial fluid collection. Ventricles and sulci are within normal limits in size and configuration. Vascular: No hyperdense vessel or unexpected calcification. Skull: Calvarium is unremarkable. Sinuses/Orbits: Patchy paranasal sinus opacification. Orbits are unremarkable. Other: Mastoid air cells are clear. IMPRESSION: No acute intracranial hemorrhage or mass effect. 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 MRI could be obtained for further evaluation. Electronically Signed   By: Macy Mis M.D.   On: 11/02/2019 13:51   MR BRAIN WO CONTRAST  Result Date: 11/03/2019 CLINICAL DATA:  Acute stroke suspected EXAM: MRI HEAD WITHOUT CONTRAST TECHNIQUE: Multiplanar, multiecho pulse sequences of the brain and surrounding structures were obtained without intravenous contrast. COMPARISON:  Head CT from yesterday FINDINGS: Brain: 12 mm acute infarct in the left corona radiata posteriorly. Moderate deep cerebral white matter disease. Chronic lacune is lateral  to the right caudate head and at the right thalamus. No atrophy or chronic blood products. Vascular: Normal flow voids Skull and upper cervical spine: Normal marrow signal Sinuses/Orbits: Patchy mucosal thickening in the paranasal sinuses. IMPRESSION: 1. 12 mm acute infarct in the left corona radiata. 2. Background chronic small vessel ischemia. 3. Generalized active sinusitis. Electronically Signed   By: Monte Fantasia M.D.   On: 11/03/2019 12:38   US Carotid Bilateral  Result Date: 11/03/2019 CLINICAL DATA:  Right arm weakness and numbness x2 days, hypertension, previous tobacco abuse EXAM: BILATERAL CAROTID DUPLEX ULTRASOUND TECHNIQUE: Pearline Cables scale imaging, color Doppler and duplex ultrasound were performed of bilateral carotid and vertebral arteries in the neck. COMPARISON:  None. FINDINGS: Criteria: Quantification of carotid stenosis is based on velocity parameters that correlate the residual internal carotid diameter with NASCET-based stenosis levels, using the diameter  of the distal internal carotid lumen as the denominator for stenosis measurement. The following velocity measurements were obtained: RIGHT ICA: 97/39 cm/sec CCA: [id number] cm/sec SYSTOLIC ICA/CCA RATIO:  1.1 ECA: 67 cm/sec LEFT ICA: 104/42 cm/sec CCA: [id number] cm/sec SYSTOLIC ICA/CCA RATIO:  1.2 ECA: 104 cm/sec RIGHT CAROTID ARTERY: Mild tortuosity. Eccentric plaque in the bifurcation without high-grade stenosis. Normal waveforms and color Doppler signal. RIGHT VERTEBRAL ARTERY:  Normal flow direction and waveform. LEFT CAROTID ARTERY: Intimal thickening in the bulb. No high-grade stenosis. Tortuous ICA. Normal waveforms and color Doppler signal. LEFT VERTEBRAL ARTERY:  Normal flow direction and waveform. IMPRESSION: 1. Mild right carotid bifurcation plaque resulting in less than 50% diameter stenosis. 2. No significant  left carotid plaque or stenosis. 3.  Antegrade bilateral vertebral arterial flow. Electronically Signed   By: Lucrezia Europe M.D.    On: 11/03/2019 08:53    Assessment: 64 y.o. female  with a history of poorly controlled HTN and tobacco abuse who who presented outside the tPA time window with complaint of right upper extremity weakness and numbness,  Has some mild RUE weakness as residual.  MRI of the brain personally reviewed and shows an acute left corona radiata infarct.  Likely secondary to small vessel disease.  BP elevated.  Patient on no antiplatelet therapy prior to admission.  Carotid dopplers show no evidence of hemodynamically significant stenosis.  A1c 5.2, LDL 140.   Stroke Risk Factors - hypertension and smoking  Plan: 1. Statin for lipid management with target LDL<70. 2. PT consult, OT consult, Speech consult 3. Echocardiogram with bubble study 4. Prophylactic therapy-Dual antiplatelet therapy with ASA 81mg  and Plavix 75mg  for three weeks with change to ASA 81mg  daily alone as monotherapy after that time. 5. Telemetry monitoring 6. Frequent neuro checks 7. Smoking cessation counseling 8. BP control with target BP<140/80   Alexis Goodell, MD Neurology [phone] 11/03/2019, 2:42 PM

## 2019-11-03 NOTE — Plan of Care (Signed)
  Problem: Activity: Goal: Risk for activity intolerance will decrease Outcome: Progressing   Problem: Coping: Goal: Level of anxiety will decrease Outcome: Progressing   

## 2019-11-03 NOTE — Progress Notes (Signed)
PROGRESS NOTE    Meagan York  [id number] DOB: 11-Oct-1955 DOA: 11/02/2019 PCP: Valerie Roys, DO    Assessment & Plan:   Active Problems:   Right arm weakness   Acute ischemic stroke ([place])    Meagan York is a 64 y.o. Caucasian female with medical history significant of HTN, COPD who presented with right arm weakness.    # Right arm weakness and numbness 2/2 12 mm acute infarct in the left corona radiata --Code stroke not called, since symptoms presented for 2 days and no other new neurological deficits developed.   --MRI brain showed acute infract.  US carotid showed no significant stenosis --LDL 140.  A1c and TSH wnl. PLAN: --Neuro consult 1. Statin for lipid management with target LDL<70. 2. PT consult, OT consult (will omit SLP since pt has no trouble eating/drink/swallowing) 3. Echocardiogram with bubble study 4. Prophylactic therapy-Dual antiplatelet therapy with ASA 81mg  and Plavix 75mg  for three weeks with change to ASA 81mg  daily alone as monotherapy after that time. 5. Telemetry monitoring 6. Frequent neuro checks 7. Smoking cessation counseling 8. BP control with target BP<140/80  # HTN --continue home amlodipine and telmisartan (as AVAPRO) since already out of the 48 hours of permissive HTN periods.  # COPD not on home O2 # Current smoker --albuterol PRN. --Will not schedule bronchodilators since pt doesn't take them regularly at home. --Counseled pt on smoking cessation and how that's a risk factor for stroke.  # GERD --continue home PPI BID   DVT prophylaxis: Lovenox SQ Code Status: Full code  Family Communication: son updated at bedside Disposition Plan: Home tomorrow   Subjective and Interval History:  Pt continued to have weakness in her right arm, and also funny feeling in her right foot.  No other neuro defects, no trouble speaking or swallowing.  No fever, dyspnea, chest pain, abdominal pain, N/V/D.   Objective: Vitals:   11/03/19 0415 11/03/19 0922 11/03/19 0924 11/03/19 1222  BP: (!) 131/93 (!) 123/102 (!) 129/101 (!) 147/84  Pulse: (!) 102 94 96 87  Resp: 18 18  18   Temp: 98 F (36.7 C)   (!) 97.5 F (36.4 C)  TempSrc: Oral   Oral  SpO2: 97% 96% 96% 98%  Weight: 92.7 kg     Height:        Intake/Output Summary (Last 24 hours) at 11/03/2019 1555 Last data filed at 11/03/2019 0417 Gross per 24 hour  Intake --  Output 700 ml  Net -700 ml   Filed Weights   11/02/19 1309 11/02/19 1824 11/03/19 0415  Weight: 90.7 kg 93.1 kg 92.7 kg    Examination:   Constitutional: NAD, AAOx3 HEENT: conjunctivae and lids normal, EOMI CV: RRR no M,R,G. Distal pulses +2.  No cyanosis.   RESP: CTA B/L, normal respiratory effort  GI: +BS, NTND Extremities: No effusions, edema, or tenderness in BLE MSK: normal ROM in right arm, some reduction in strength SKIN: warm, dry and intact Neuro: II - XII grossly intact.  Sensation intact Psych: Normal mood and affect.  Appropriate judgement and reason   Data Reviewed: I have personally reviewed following labs and imaging studies  CBC: Recent Labs  Lab 11/02/19 1318  WBC 7.8  NEUTROABS 5.1  HGB 12.7  HCT 39.5  MCV 94.5  PLT [id number]   Basic Metabolic Panel: Recent Labs  Lab 11/02/19 1318  NA 136  K 3.5  CL 98  CO2 28  GLUCOSE 100*  BUN 6*  CREATININE  0.59  CALCIUM 8.7*   GFR: Estimated Creatinine Clearance: 83.1 mL/min (by C-G formula based on SCr of 0.59 mg/dL). Liver Function Tests: Recent Labs  Lab 11/02/19 1318  AST 29  ALT 34  ALKPHOS 86  BILITOT 0.9  PROT 7.4  ALBUMIN 3.6   No results for input(s): LIPASE, AMYLASE in the last 168 hours. No results for input(s): AMMONIA in the last 168 hours. Coagulation Profile: Recent Labs  Lab 11/02/19 1318  INR 0.9   Cardiac Enzymes: No results for input(s): CKTOTAL, CKMB, CKMBINDEX, TROPONINI in the last 168 hours. BNP (last 3 results) No results for input(s): PROBNP in the last 8760  hours. HbA1C: Recent Labs    11/02/19 1318  HGBA1C 5.2   CBG: No results for input(s): GLUCAP in the last 168 hours. Lipid Profile: Recent Labs    11/02/19 1318  CHOL 222*  HDL 58  LDLCALC 140*  TRIG 119  CHOLHDL 3.8   Thyroid Function Tests: Recent Labs    11/02/19 1318  TSH 2.357   Anemia Panel: No results for input(s): VITAMINB12, FOLATE, FERRITIN, TIBC, IRON, RETICCTPCT in the last 72 hours. Sepsis Labs: No results for input(s): PROCALCITON, LATICACIDVEN in the last 168 hours.  Recent Results (from the past 240 hour(s))  SARS CORONAVIRUS 2 (TAT 6-24 HRS) Nasopharyngeal Nasopharyngeal Swab     Status: None   Collection Time: 11/02/19  4:31 PM   Specimen: Nasopharyngeal Swab  Result Value Ref Range Status   SARS Coronavirus 2 NEGATIVE NEGATIVE Final    Comment: (NOTE) SARS-CoV-2 target nucleic acids are NOT DETECTED. The SARS-CoV-2 RNA is generally detectable in upper and lower respiratory specimens during the acute phase of infection. Negative results do not preclude SARS-CoV-2 infection, do not rule out co-infections with other pathogens, and should not be used as the sole basis for treatment or other patient management decisions. Negative results must be combined with clinical observations, patient history, and epidemiological information. The expected result is Negative. Fact Sheet for Patients: SugarRoll.be Fact Sheet for Healthcare Providers: https://www.woods-mathews.com/ This test is not yet approved or cleared by the [place] FDA and  has been authorized for detection and/or diagnosis of SARS-CoV-2 by FDA under an Emergency Use Authorization (EUA). This EUA will remain  in effect (meaning this test can be used) for the duration of the COVID-19 declaration under Section 56 4(b)(1) of the Act, 21 U.S.C. section 360bbb-3(b)(1), unless the authorization is terminated or revoked sooner. Performed at [facility], [place] [address], [place], [place] [address]       Radiology Studies: CT HEAD WO CONTRAST  Result Date: 11/02/2019 CLINICAL DATA:  Right arm numbness EXAM: CT HEAD WITHOUT CONTRAST TECHNIQUE: Contiguous axial images were obtained from the base of the skull through the vertex without intravenous contrast. COMPARISON:  None. FINDINGS: Brain: There is no acute intracranial hemorrhage, mass-effect, or edema. Gray-white differentiation is preserved. Patchy hypoattenuation in the supratentorial white matter is nonspecific but may reflect mild chronic microvascular ischemic changes. There are age-indeterminate small vessel infarcts of the right basal ganglia and left corona radiata. There is no extra-axial fluid collection. Ventricles and sulci are within normal limits in size and configuration. Vascular: No hyperdense vessel or unexpected calcification. Skull: Calvarium is unremarkable. Sinuses/Orbits: Patchy paranasal sinus opacification. Orbits are unremarkable. Other: Mastoid air cells are clear. IMPRESSION: No acute intracranial hemorrhage or mass effect. 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 MRI could be obtained for further evaluation.  Electronically Signed   By: Macy Mis M.D.   On: 11/02/2019 13:51   MR BRAIN WO CONTRAST  Result Date: 11/03/2019 CLINICAL DATA:  Acute stroke suspected EXAM: MRI HEAD WITHOUT CONTRAST TECHNIQUE: Multiplanar, multiecho pulse sequences of the brain and surrounding structures were obtained without intravenous contrast. COMPARISON:  Head CT from yesterday FINDINGS: Brain: 12 mm acute infarct in the left corona radiata posteriorly. Moderate deep cerebral white matter disease. Chronic lacune is lateral to the right caudate head and at the right thalamus. No atrophy or chronic blood products. Vascular: Normal flow voids Skull and upper cervical spine: Normal marrow signal Sinuses/Orbits: Patchy mucosal thickening  in the paranasal sinuses. IMPRESSION: 1. 12 mm acute infarct in the left corona radiata. 2. Background chronic small vessel ischemia. 3. Generalized active sinusitis. Electronically Signed   By: Monte Fantasia M.D.   On: 11/03/2019 12:38   US Carotid Bilateral  Result Date: 11/03/2019 CLINICAL DATA:  Right arm weakness and numbness x2 days, hypertension, previous tobacco abuse EXAM: BILATERAL CAROTID DUPLEX ULTRASOUND TECHNIQUE: Pearline Cables scale imaging, color Doppler and duplex ultrasound were performed of bilateral carotid and vertebral arteries in the neck. COMPARISON:  None. FINDINGS: Criteria: Quantification of carotid stenosis is based on velocity parameters that correlate the residual internal carotid diameter with NASCET-based stenosis levels, using the diameter of the distal internal carotid lumen as the denominator for stenosis measurement. The following velocity measurements were obtained: RIGHT ICA: 97/39 cm/sec CCA: [id number] cm/sec SYSTOLIC ICA/CCA RATIO:  1.1 ECA: 67 cm/sec LEFT ICA: 104/42 cm/sec CCA: [id number] cm/sec SYSTOLIC ICA/CCA RATIO:  1.2 ECA: 104 cm/sec RIGHT CAROTID ARTERY: Mild tortuosity. Eccentric plaque in the bifurcation without high-grade stenosis. Normal waveforms and color Doppler signal. RIGHT VERTEBRAL ARTERY:  Normal flow direction and waveform. LEFT CAROTID ARTERY: Intimal thickening in the bulb. No high-grade stenosis. Tortuous ICA. Normal waveforms and color Doppler signal. LEFT VERTEBRAL ARTERY:  Normal flow direction and waveform. IMPRESSION: 1. Mild right carotid bifurcation plaque resulting in less than 50% diameter stenosis. 2. No significant  left carotid plaque or stenosis. 3.  Antegrade bilateral vertebral arterial flow. Electronically Signed   By: Lucrezia Europe M.D.   On: 11/03/2019 08:53     Scheduled Meds: . amLODipine  5 mg Oral Daily  . atorvastatin  40 mg Oral q1800  . clopidogrel  75 mg Oral Daily  . enoxaparin (LOVENOX) injection  40 mg Subcutaneous Q24H  .  fluticasone furoate-vilanterol  1 puff Inhalation Daily  . irbesartan  300 mg Oral Daily  . nicotine  21 mg Transdermal Daily  . pantoprazole  40 mg Oral BID  . sodium chloride flush  3 mL Intravenous Q12H  . tiotropium  1 capsule Inhalation Daily   Continuous Infusions:   LOS: 0 days     Enzo Bi, MD [facility] If 7PM-7AM, please contact night-coverage 11/03/2019, 3:55 PM

## 2019-11-04 ENCOUNTER — Inpatient Hospital Stay (HOSPITAL_COMMUNITY)
Admit: 2019-11-04 | Discharge: 2019-11-04 | Disposition: A | Discharge status: Home / Self Care | Payer: 59 | Attending: Neurology | Admitting: Neurology

## 2019-11-04 DIAGNOSIS — I639 Cerebral infarction, unspecified: Secondary | ICD-10-CM

## 2019-11-04 DIAGNOSIS — I6389 Other cerebral infarction: Secondary | ICD-10-CM

## 2019-11-04 LAB — BASIC METABOLIC PANEL
Anion gap: 9 (ref 5–15)
BUN: 8 mg/dL (ref 8–23)
CO2: 27 mmol/L (ref 22–32)
Calcium: 8.4 mg/dL — ABNORMAL LOW (ref 8.9–10.3)
Chloride: 103 mmol/L (ref 98–111)
Creatinine, Ser: 0.64 mg/dL (ref 0.44–1.00)
GFR calc Af Amer: >60 mL/min (ref >60)
GFR calc non Af Amer: >60 mL/min (ref >60)
Glucose, Bld: 101 mg/dL — ABNORMAL HIGH (ref 70–99)
Potassium: 3.8 mmol/L (ref 3.5–5.1)
Sodium: 139 mmol/L (ref 135–145)

## 2019-11-04 LAB — MAGNESIUM: Magnesium: 2.1 mg/dL (ref 1.7–2.4)

## 2019-11-04 LAB — CBC
HCT: 36.5 % (ref 36.0–46.0)
Hemoglobin: 11.7 g/dL — ABNORMAL LOW (ref 12.0–15.0)
MCH: 30.4 pg (ref 26.0–34.0)
MCHC: 32.1 g/dL (ref 30.0–36.0)
MCV: 94.8 fL (ref 80.0–100.0)
Platelets: 269 10*3/uL (ref 150–400)
RBC: 3.85 MIL/uL — ABNORMAL LOW (ref 3.87–5.11)
RDW: 11.9 % (ref 11.5–15.5)
WBC: 6.6 10*3/uL (ref 4.0–10.5)
nRBC: 0 % (ref 0.0–0.2)

## 2019-11-04 MED ORDER — PERFLUTREN LIPID MICROSPHERE
1.0000 mL | INTRAVENOUS | Status: AC | PRN
  Administered 2019-11-04: 3.5 mL via INTRAVENOUS
  Filled 2019-11-04: qty 10

## 2019-11-04 MED ORDER — CLOPIDOGREL BISULFATE 75 MG PO TABS: 75.0000 mg | ORAL_TABLET | Freq: Every day | ORAL | 0 refills | Status: AC

## 2019-11-04 MED ORDER — ASPIRIN EC 81 MG PO TBEC: 81.0000 mg | DELAYED_RELEASE_TABLET | Freq: Every day | ORAL | Status: DC

## 2019-11-04 MED ORDER — ATORVASTATIN CALCIUM 40 MG PO TABS: 40.0000 mg | ORAL_TABLET | Freq: Every day | ORAL | 2 refills | Status: DC

## 2019-11-04 MED ORDER — CELECOXIB 200 MG PO CAPS: 200.0000 mg | ORAL_CAPSULE | Freq: Every day | ORAL | 1 refills | Status: DC | PRN

## 2019-11-04 MED ORDER — NICOTINE 21 MG/24HR TD PT24: 21.0000 mg | MEDICATED_PATCH | Freq: Every day | TRANSDERMAL | 2 refills | Status: DC

## 2019-11-04 NOTE — Discharge Summary (Signed)
Physician Discharge Summary   Meagan York  female DOB: Mar 04, 1956  [id number]  PCP: Valerie Roys, DO  Admit date: 11/02/2019 Discharge date: 11/04/2019  Admitted From: home Disposition:  home CODE STATUS: Full code  Discharge Instructions    Diet - low sodium heart healthy   Complete by: As directed    Discharge instructions   Complete by: As directed    You have had a stroke that caused your right arm weakness.  Neurologist recommended taking aspirin 81 indefinitely, and plavix 75 mg daily for 3 weeks.  Also cholesterol medication Lipitor to lower your LDL <70.    Please stop smoking, control your blood pressure, lower your cholesterol, which will help prevent more strokes.   Dr. Enzo Bi - -   Increase activity slowly   Complete by: As directed        Hospital Course:  For full details, please see H&P, progress notes, consult notes and ancillary notes.  Briefly,  Meagan Bauernfeind Dyeris a 64 y.o.Caucasianfemalewith medical history significant ofHTN, COPD who presented with right arm weakness.    # Right arm weakness and numbness 2/2 12 mm acute infarct in the left corona radiata Code stroke not called, since symptoms presented for 2 days and no other new neurological deficits developed. MRI brain showed acute infract.  US carotid showed no significant stenosis.  LDL 140.  A1c and TSH wnl.  Echocardiogramwith bubble study showed grossly normal atrial septum.  Neurology consulted. Recommand lipid management with target LDL<70, and prophylactic Dual antiplatelet therapy with ASA 81mg  and Plavix 75mg  for three weeks with change toASA 81mg  dailyalone as monotherapy after that time.  Smoking cessation counseling provided.  # HTN Continued home amlodipine and telmisartan (as AVAPRO) since already out of the 48 hours of permissive HTN periods.  # COPD not on home O2 # Current smoker Respiratory status stable. Counseled pt on smoking cessation and how that's a  risk factor for stroke.  # GERD Continued home PPI BID   Discharge Diagnoses:  Active Problems:   Right arm weakness   Acute ischemic stroke [facility])    Discharge Instructions:  Allergies as of 11/04/2019      Reactions   Acetaminophen Nausea And Vomiting   Aspirin    Bleeding ulcers   Oxycodone-acetaminophen Nausea Only   Tramadol Nausea And Vomiting      Medication List    STOP taking these medications   HYDROcodone-acetaminophen 5-325 MG tablet Commonly known as: NORCO/VICODIN     TAKE these medications   albuterol 108 (90 Base) MCG/ACT inhaler Commonly known as: ProAir HFA Inhale 1-2 puffs into the lungs every 4 (four) hours as needed for wheezing or shortness of breath.   amLODipine 5 MG tablet Commonly known as: NORVASC TAKE 1 TABLET BY MOUTH EVERY DAY   aspirin EC 81 MG tablet Take 1 tablet (81 mg total) by mouth daily.   atorvastatin 40 MG tablet Commonly known as: LIPITOR Take 1 tablet (40 mg total) by mouth daily at 6 PM.   budesonide-formoterol 160-4.5 MCG/ACT inhaler Commonly known as: Symbicort Inhale 2 puffs into the lungs 2 (two) times daily.   celecoxib 200 MG capsule Commonly known as: CELEBREX Take 1 capsule (200 mg total) by mouth daily as needed. What changed:   how much to take  when to take this  reasons to take this   clopidogrel 75 MG tablet Commonly known as: PLAVIX Take 1 tablet (75 mg total) by mouth daily for  21 days. Then stop.   nicotine 21 mg/24hr patch Commonly known as: NICODERM CQ - dosed in mg/24 hours Place 1 patch (21 mg total) onto the skin daily. Start taking on: November 05, 2019   pantoprazole 40 MG tablet Commonly known as: PROTONIX Take 1 tablet (40 mg total) by mouth 2 (two) times daily.   Spacer/Aero-Holding Dorise Bullion 1 each by Does not apply route daily.   Spiriva Respimat 2.5 MCG/ACT Aers Generic drug: Tiotropium Bromide Monohydrate Inhale 1 puff into the lungs daily.   telmisartan 80 MG  tablet Commonly known as: MICARDIS Take 1 tablet (80 mg total) by mouth daily.   Vitamin D3 25 MCG (1000 UT) Caps Take 1,000 Units by mouth daily.       Follow-up Information    Park Liter P, DO. Schedule an appointment as soon as possible for a visit in 1 week(s).   Specialty: Family Medicine Contact information: [place] [address] [phone]           Allergies  Allergen Reactions  . Acetaminophen Nausea And Vomiting  . Aspirin     Bleeding ulcers  . Oxycodone-Acetaminophen Nausea Only  . Tramadol Nausea And Vomiting     The results of significant diagnostics from this hospitalization (including imaging, microbiology, ancillary and laboratory) are listed below for reference.   Consultations:   Procedures/Studies: CT HEAD WO CONTRAST  Result Date: 11/02/2019 CLINICAL DATA:  Right arm numbness EXAM: CT HEAD WITHOUT CONTRAST TECHNIQUE: Contiguous axial images were obtained from the base of the skull through the vertex without intravenous contrast. COMPARISON:  None. FINDINGS: Brain: There is no acute intracranial hemorrhage, mass-effect, or edema. Gray-white differentiation is preserved. Patchy hypoattenuation in the supratentorial white matter is nonspecific but may reflect mild chronic microvascular ischemic changes. There are age-indeterminate small vessel infarcts of the right basal ganglia and left corona radiata. There is no extra-axial fluid collection. Ventricles and sulci are within normal limits in size and configuration. Vascular: No hyperdense vessel or unexpected calcification. Skull: Calvarium is unremarkable. Sinuses/Orbits: Patchy paranasal sinus opacification. Orbits are unremarkable. Other: Mastoid air cells are clear. IMPRESSION: No acute intracranial hemorrhage or mass effect. 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 MRI could be obtained for further evaluation. Electronically Signed   By:  Macy Mis M.D.   On: 11/02/2019 13:51   MR BRAIN WO CONTRAST  Result Date: 11/03/2019 CLINICAL DATA:  Acute stroke suspected EXAM: MRI HEAD WITHOUT CONTRAST TECHNIQUE: Multiplanar, multiecho pulse sequences of the brain and surrounding structures were obtained without intravenous contrast. COMPARISON:  Head CT from yesterday FINDINGS: Brain: 12 mm acute infarct in the left corona radiata posteriorly. Moderate deep cerebral white matter disease. Chronic lacune is lateral to the right caudate head and at the right thalamus. No atrophy or chronic blood products. Vascular: Normal flow voids Skull and upper cervical spine: Normal marrow signal Sinuses/Orbits: Patchy mucosal thickening in the paranasal sinuses. IMPRESSION: 1. 12 mm acute infarct in the left corona radiata. 2. Background chronic small vessel ischemia. 3. Generalized active sinusitis. Electronically Signed   By: Monte Fantasia M.D.   On: 11/03/2019 12:38   US Carotid Bilateral  Result Date: 11/03/2019 CLINICAL DATA:  Right arm weakness and numbness x2 days, hypertension, previous tobacco abuse EXAM: BILATERAL CAROTID DUPLEX ULTRASOUND TECHNIQUE: Pearline Cables scale imaging, color Doppler and duplex ultrasound were performed of bilateral carotid and vertebral arteries in the neck. COMPARISON:  None. FINDINGS: Criteria: Quantification of  carotid stenosis is based on velocity parameters that correlate the residual internal carotid diameter with NASCET-based stenosis levels, using the diameter of the distal internal carotid lumen as the denominator for stenosis measurement. The following velocity measurements were obtained: RIGHT ICA: 97/39 cm/sec CCA: [id number] cm/sec SYSTOLIC ICA/CCA RATIO:  1.1 ECA: 67 cm/sec LEFT ICA: 104/42 cm/sec CCA: [id number] cm/sec SYSTOLIC ICA/CCA RATIO:  1.2 ECA: 104 cm/sec RIGHT CAROTID ARTERY: Mild tortuosity. Eccentric plaque in the bifurcation without high-grade stenosis. Normal waveforms and color Doppler signal. RIGHT VERTEBRAL  ARTERY:  Normal flow direction and waveform. LEFT CAROTID ARTERY: Intimal thickening in the bulb. No high-grade stenosis. Tortuous ICA. Normal waveforms and color Doppler signal. LEFT VERTEBRAL ARTERY:  Normal flow direction and waveform. IMPRESSION: 1. Mild right carotid bifurcation plaque resulting in less than 50% diameter stenosis. 2. No significant  left carotid plaque or stenosis. 3.  Antegrade bilateral vertebral arterial flow. Electronically Signed   By: Lucrezia Europe M.D.   On: 11/03/2019 08:53   ECHOCARDIOGRAM COMPLETE BUBBLE STUDY  Result Date: 11/04/2019    ECHOCARDIOGRAM REPORT   Patient Name:   LOLETHA TEATE Date of Exam: 11/04/2019 Medical Rec #:  [id number]      Height:       65.0 in Accession #:    [id number]     Weight:       203.3 lb Date of Birth:  1956-02-28      BSA:          1.992 m Patient Age:    11 years       BP:           129/87 mmHg Patient Gender: F              HR:           92 bpm. Exam Location:  [facility] Procedure: 2D Echo, Saline Contrast Bubble Study and Intracardiac Opacification            Agent Indications:     Stroke 434.91/ I63.9  History:         Patient has no prior history of Echocardiogram examinations.  Sonographer:     Arville Go RDCS Referring Phys:  [place] Diagnosing Phys: Mertie Moores MD  Sonographer Comments: Technically difficult study due to poor echo windows and suboptimal apical window. Image acquisition challenging due to respiratory motion. IMPRESSIONS  1. Left ventricular ejection fraction, by estimation, is 60 to 65%. The left ventricle has normal function. The left ventricle has no regional wall motion abnormalities. Left ventricular diastolic parameters are consistent with Grade I diastolic dysfunction (impaired relaxation).  2. Right ventricular systolic function is normal. The right ventricular size is normal.  3. Poor quality bubble study. I did not see any R to L shunting across the atrial septum but the quality of the study was  suboptimal.  4. The mitral valve is normal in structure and function. No evidence of mitral valve regurgitation. No evidence of mitral stenosis.  5. The aortic valve is normal in structure and function. Aortic valve regurgitation is not visualized. No aortic stenosis is present. FINDINGS  Left Ventricle: Left ventricular ejection fraction, by estimation, is 60 to 65%. The left ventricle has normal function. The left ventricle has no regional wall motion abnormalities. Definity contrast agent was given IV to delineate the left ventricular  endocardial borders. The left ventricular internal cavity size was normal in size. There is no left ventricular hypertrophy. Left ventricular diastolic parameters are consistent with  Grade I diastolic dysfunction (impaired relaxation). Right Ventricle: The right ventricular size is normal. No increase in right ventricular wall thickness. Right ventricular systolic function is normal. Left Atrium: Left atrial size was normal in size. Right Atrium: Right atrial size was normal in size. Pericardium: There is no evidence of pericardial effusion. Mitral Valve: The mitral valve is normal in structure and function. No evidence of mitral valve regurgitation. No evidence of mitral valve stenosis. Tricuspid Valve: The tricuspid valve is normal in structure. Tricuspid valve regurgitation is not demonstrated. Aortic Valve: The aortic valve is normal in structure and function. Aortic valve regurgitation is not visualized. No aortic stenosis is present. Aortic valve peak gradient measures 6.0 mmHg. Pulmonic Valve: The pulmonic valve was normal in structure. Pulmonic valve regurgitation is not visualized. Aorta: The aortic root and ascending aorta are structurally normal, with no evidence of dilitation. IAS/Shunts: The atrial septum is grossly normal. Agitated saline contrast was given intravenously to evaluate for intracardiac shunting.  LEFT VENTRICLE PLAX 2D LVIDd:         4.09 cm  Diastology  LVIDs:         2.71 cm  LV e' lateral:   7.72 cm/s LV PW:         1.14 cm  LV E/e' lateral: 8.4 LV IVS:        1.14 cm  LV e' medial:    6.20 cm/s LVOT diam:     1.80 cm  LV E/e' medial:  10.5 LV SV:         44.28 ml LV SV Index:   22.23 LVOT Area:     2.54 cm  LEFT ATRIUM             Index LA diam:        3.00 cm 1.51 cm/m LA Vol (A2C):   18.8 ml 9.44 ml/m LA Vol (A4C):   20.0 ml 10.04 ml/m LA Biplane Vol: 20.7 ml 10.39 ml/m  AORTIC VALVE                PULMONIC VALVE AV Area (Vmax): 2.17 cm    PV Vmax:       0.76 m/s AV Vmax:        122.00 cm/s PV Peak grad:  2.3 mmHg AV Peak Grad:   6.0 mmHg LVOT Vmax:      104.00 cm/s LVOT Vmean:     63.600 cm/s LVOT VTI:       0.174 m  AORTA Ao Root diam: 3.20 cm Ao Asc diam:  3.20 cm MITRAL VALVE MV Area (PHT): 4.89 cm     SHUNTS MV Decel Time: 155 msec     Systemic VTI:  0.17 m MV E velocity: 65.00 cm/s   Systemic Diam: 1.80 cm MV A velocity: 109.00 cm/s MV E/A ratio:  0.60 Mertie Moores MD Electronically signed by Mertie Moores MD Signature Date/Time: 11/04/2019/1:41:39 PM    Final       Labs: BNP (last 3 results) No results for input(s): BNP in the last 8760 hours. Basic Metabolic Panel: Recent Labs  Lab 11/02/19 1318 11/04/19 0358  NA 136 139  K 3.5 3.8  CL 98 103  CO2 28 27  GLUCOSE 100* 101*  BUN 6* 8  CREATININE 0.59 0.64  CALCIUM 8.7* 8.4*  MG  --  2.1   Liver Function Tests: Recent Labs  Lab 11/02/19 1318  AST 29  ALT 34  ALKPHOS 86  BILITOT 0.9  PROT 7.4  ALBUMIN 3.6   No results for input(s): LIPASE, AMYLASE in the last 168 hours. No results for input(s): AMMONIA in the last 168 hours. CBC: Recent Labs  Lab 11/02/19 1318 11/04/19 0358  WBC 7.8 6.6  NEUTROABS 5.1  --   HGB 12.7 11.7*  HCT 39.5 36.5  MCV 94.5 94.8  PLT 307 269   Cardiac Enzymes: No results for input(s): CKTOTAL, CKMB, CKMBINDEX, TROPONINI in the last 168 hours. BNP: Invalid input(s): POCBNP CBG: No results for input(s): GLUCAP in the last 168  hours. D-Dimer No results for input(s): DDIMER in the last 72 hours. Hgb A1c Recent Labs    11/02/19 1318  HGBA1C 5.2   Lipid Profile Recent Labs    11/02/19 1318  CHOL 222*  HDL 58  LDLCALC 140*  TRIG 119  CHOLHDL 3.8   Thyroid function studies Recent Labs    11/02/19 1318  TSH 2.357   Anemia work up No results for input(s): VITAMINB12, FOLATE, FERRITIN, TIBC, IRON, RETICCTPCT in the last 72 hours. Urinalysis    Component Value Date/Time   COLORURINE STRAW (A) 07/04/2015 2320   APPEARANCEUR Clear 05/31/2019 1520   LABSPEC 1.014 07/04/2015 2320   LABSPEC 1.003 08/07/2012 1409   PHURINE 6.0 07/04/2015 2320   GLUCOSEU Negative 05/31/2019 1520   GLUCOSEU Negative 08/07/2012 1409   HGBUR NEGATIVE 07/04/2015 2320   BILIRUBINUR Negative 05/31/2019 1520   BILIRUBINUR Negative 08/07/2012 1409   KETONESUR NEGATIVE 07/04/2015 2320   PROTEINUR Negative 05/31/2019 1520   PROTEINUR NEGATIVE 07/04/2015 2320   NITRITE Negative 05/31/2019 1520   NITRITE NEGATIVE 07/04/2015 2320   LEUKOCYTESUR Negative 05/31/2019 1520   LEUKOCYTESUR Negative 08/07/2012 1409   Sepsis Labs Invalid input(s): PROCALCITONIN,  WBC,  LACTICIDVEN Microbiology Recent Results (from the past 240 hour(s))  SARS CORONAVIRUS 2 (TAT 6-24 HRS) Nasopharyngeal Nasopharyngeal Swab     Status: None   Collection Time: 11/02/19  4:31 PM   Specimen: Nasopharyngeal Swab  Result Value Ref Range Status   SARS Coronavirus 2 NEGATIVE NEGATIVE Final    Comment: (NOTE) SARS-CoV-2 target nucleic acids are NOT DETECTED. The SARS-CoV-2 RNA is generally detectable in upper and lower respiratory specimens during the acute phase of infection. Negative results do not preclude SARS-CoV-2 infection, do not rule out co-infections with other pathogens, and should not be used as the sole basis for treatment or other patient management decisions. Negative results must be combined with clinical observations, patient history, and  epidemiological information. The expected result is Negative. Fact Sheet for Patients: SugarRoll.be Fact Sheet for Healthcare Providers: https://www.woods-mathews.com/ This test is not yet approved or cleared by the [place] FDA and  has been authorized for detection and/or diagnosis of SARS-CoV-2 by FDA under an Emergency Use Authorization (EUA). This EUA will remain  in effect (meaning this test can be used) for the duration of the COVID-19 declaration under Section 56 4(b)(1) of the Act, 21 U.S.C. section 360bbb-3(b)(1), unless the authorization is terminated or revoked sooner. Performed at [facility], [place] [address], [place],  [address]      Total time spend on discharging this patient, including the last patient exam, discussing the hospital stay, instructions for ongoing care as it relates to all pertinent caregivers, as well as preparing the medical discharge records, prescriptions, and/or referrals as applicable, is 30 minutes.    Enzo Bi, MD  [facility] 11/04/2019, 1:59 PM  If 7PM-7AM, please contact night-coverage

## 2019-11-04 NOTE — Evaluation (Signed)
Physical Therapy Evaluation Patient Details Name: Meagan York MRN: [id number] DOB: [birth year] Today's Date: 11/04/2019   History of Present Illness  Per MD notes: Pt is a 64 y/o F who presented to the ED on 11/02/2019 with a 2 day hx of R sided weakness and numbness. 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 PMH includes: COPD, HTN, IBS, GERD, arthritis, breast cancer, and Achilles rupture repair 4 months ago.    Clinical Impression  Pt pleasant and motivated to participate during the session. Pt found on RA and SpO2 and HR were WNL t/o session. Pt found walking around room and was able to complete transfers, amb, and stair training without physical assistance. Chief complaint was RUE weakness and upon PT examination, pt was found to have decreased strength (prox>distal), decreased fine motor skills, and decreased gross motor coordination. Pt stated that for her RLE, she has impairments at baseline (secondary to a 4 month hx of an Achilles repair that she did not go to physical therapy for due to requested frequency and associated costs) but that she thinks her R leg is weaker than normal following her stroke and stated that it "feels a little funny". During examination, RLE weakness was found during amb, single leg stance, and with stairs. It is unclear how much of the LE deficits, if any, are secondary to her stroke. On several occasions t/o the session, pt verbally expressed a desire to modify her lifestyle, quite smoking, and lose weight. Pt was briefly educated on strategies but was encouraged to follow-up with her PCP or other providers after D/C from the hospital. Pt with diagnosed with CVA but most appropriate for 2x/week secondary to high level of function. Pt will benefit from PT services in an outpatient setting upon discharge to safely address deficits listed in patient problem list for decreased caregiver assistance and eventual return to PLOF.    Follow  Up Recommendations Outpatient PT    Equipment Recommendations  None recommended by PT    Recommendations for Other Services       Precautions / Restrictions Precautions Precautions: Fall Restrictions Weight Bearing Restrictions: No      Mobility  Bed Mobility               General bed mobility comments: NT secondary to pt found walking around in room and end session sitting EOB  Transfers Overall transfer level: Modified independent               General transfer comment: increased effort but did not required physical assistance  Ambulation/Gait Ambulation/Gait assistance: Min guard Gait Distance (Feet): 200 Feet x1, 100 feet x1 Assistive device: None Gait Pattern/deviations: Antalgic;Decreased stance time - right Gait velocity: decreased   General Gait Details: pt had decreased stance time and gait appeared antalgic. Pt stated that she had some difficulties walking since her Achilles reapir but that she felt like she had more R leg weakness when ambulating today than her baseline prior to the stroke.  Stairs Stairs: Yes Stairs assistance: Min guard Stair Management: One rail Left;Step to pattern;Forwards;Sideways Number of Stairs: 5 General stair comments: pt required verbal cueing for stair sequencing to go up with the L LE first and go down with the R LE first. Pt used the L handrail with a step-to pattern facing forward when going up and used the L handrail with a step-to pattern facing sideways on the way down. Pt had decreased muscular control when  in RLE single leg stance.   Wheelchair Mobility    Modified Rankin (Stroke Patients Only)       Balance Overall balance assessment: Modified Independent Sitting-balance support: No upper extremity supported;Feet unsupported Sitting balance-Leahy Scale: Good     Standing balance support: No upper extremity supported;During functional activity Standing balance-Leahy Scale: Fair Standing balance  comment: Pt had decreased stability when challenging her balance but had no instances of LOB. Pt had appropriate righting reactions when balance was challenged Single Leg Stance - Right Leg: 3" (pt reported more challenging than the L side) Single Leg Stance - Left Leg: 8" Tandem Stance - Right Leg: (WNL) Tandem Stance - Left Leg: (WNL) Rhomberg - Eyes Opened: (WNL) Rhomberg - Eyes Closed: (WNL) High level balance activites: Head turns High Level Balance Comments: Pt able to maintain a straight line while doing side-to-side and up-and-down head turns when amb             Pertinent Vitals/Pain Pain Assessment: No/denies pain    Home Living Family/patient expects to be discharged to:: Private residence Living Arrangements: Other (Comment)(5 family members, not specified) Available Help at Discharge: Family Type of Home: Mobile home Home Access: Stairs to enter Entrance Stairs-Rails: Right;Left(cannot reach both) Entrance Stairs-Number of Steps: 8 Home Layout: One level Home Equipment: Crutches;Walker - 2 wheels;Cane - quad;Other (comment)(knee scooter)      Prior Function Level of Independence: Independent with assistive device(s)         Comments: pt ind with ADL's. pt is s/p Achilles Repair 4 months ago and is a limited community amb secondary to surgery. Pt stated that she recently stopped using AD's prior to the stroke but had previous used a knee scooter. Pt reports two falls in the past six months: one she tripped over her knee scooter, and one shortly after her Achilles repair when she stated that she felt "loopey" from the pain medication     Hand Dominance   Dominant Hand: Left    Extremity/Trunk Assessment   Upper Extremity Assessment Upper Extremity Assessment: Generalized weakness;RUE deficits/detail RUE Deficits / Details: distal strength > proximal strength; difficulty with fine motor movement; decreased coordination. LUE grossly 4+/5, R shoulder grossly  4-/5, R EWH grossly 4/5 RUE Sensation: WNL RUE Coordination: decreased fine motor;decreased gross motor(for finger opposition, pt had increased time and effort and decreased accuracy. For finger-to-nose coordination testing, pt needed increased time and effort and had decreased accuracy compared to the L side)    Lower Extremity Assessment Lower Extremity Assessment: Generalized weakness;RLE deficits/detail RLE Deficits / Details: Decreased strength with functional acitivty (amb and single-leg stance). Difficult to fully assess due pre-exsisting deficits secondary to a 4 month hx of R Achilles repair       Communication      Cognition Arousal/Alertness: Awake/alert Behavior During Therapy: WFL for tasks assessed/performed Overall Cognitive Status: Within Functional Limits for tasks assessed                                        General Comments      Exercises Other Exercises Other Exercises: Balance: Rhomberg EO/EC, tandem stance EO/EC, SLS, straight line amb w/ head turns Other Exercises: Stair training Other Exercises: motivational interviewing for lifestyle modifications   Assessment/Plan    PT Assessment Patient needs continued PT services  PT Problem List Decreased strength;Decreased mobility;Decreased safety awareness;Decreased range of motion;Decreased coordination;Decreased activity tolerance;Decreased  balance       PT Treatment Interventions Therapeutic exercise;Gait training;Balance training;Stair training;Functional mobility training;Therapeutic activities    PT Goals (Current goals can be found in the Care Plan section)  Acute Rehab PT Goals Patient Stated Goal: "limbs get stronger, lose weight, quite smoking" PT Goal Formulation: With patient Time For Goal Achievement: 11/17/19 Potential to Achieve Goals: Good    Frequency Min 2X/week   Barriers to discharge        Co-evaluation               AM-PAC PT "6 Clicks" Mobility   Outcome Measure Help needed turning from your back to your side while in a flat bed without using bedrails?: None Help needed moving from lying on your back to sitting on the side of a flat bed without using bedrails?: None Help needed moving to and from a bed to a chair (including a wheelchair)?: None Help needed standing up from a chair using your arms (e.g., wheelchair or bedside chair)?: A Little Help needed to walk in hospital room?: A Little Help needed climbing 3-5 steps with a railing? : A Little 6 Click Score: 21    End of Session Equipment Utilized During Treatment: Gait belt Activity Tolerance: Patient tolerated treatment well Patient left: in bed;with call bell/phone within reach Nurse Communication: Mobility status PT Visit Diagnosis: Unsteadiness on feet (R26.81);History of falling (Z91.81);Hemiplegia and hemiparesis Hemiplegia - Right/Left: Right Hemiplegia - dominant/non-dominant: Non-dominant Hemiplegia - caused by: Cerebral infarction    Time: [id number] PT Time Calculation (min) (ACUTE ONLY): 50 min   Charges:              Annabelle Harman, SPT 11/04/19 11:04 AM

## 2019-11-04 NOTE — TOC Initial Note (Signed)
Transition of Care [facility]) - Initial/Assessment Note    Patient Details  Name: Meagan York MRN: [id number] Date of Birth: [birth year]  Transition [facility]) CM/SW Contact:    Elliot Gurney [place], [place] Phone Number: 11/04/2019, 12:18 PM  Clinical Narrative:                 Patient is a 64 year old female admitted following a stroke that caused right arm weakness. PT recommended outpatient PT. Referral discussed with patient, list of providers reviewed with patient. Patient chose [facility] to receive PT but will need to verify insurance co-pay first. Outpatient referral faxed to [place]. Patient discharging home today, no DME needs at this time. Patient's son's will be provided patient transport home today. Patient discussed having no further needs at this time.  Tena Linebaugh, LCSW Clinical Social Work [phone]   Expected Discharge Plan: Home/Self Care Barriers to Discharge: No Barriers Identified   Patient Goals and CMS Choice Patient states their goals for this hospitalization and ongoing recovery are:: "I wanted to know why my arm was trembled"      Expected Discharge Plan and Services Expected Discharge Plan: Home/Self Care In-house Referral: Clinical Social Work   Post Acute Care Choice: (outpatient PT) Living arrangements for the past 2 months: Single Family Home Expected Discharge Date: 11/04/19                         [facility] Arranged: (outpatient referral to [place] clinic)          Prior Living Arrangements/Services Living arrangements for the past 2 months: Single Family Home Lives with:: Adult Children Patient language and need for interpreter reviewed:: Yes Do you feel safe going back to the place where you live?: Yes      Need for Family Participation in Patient Care: No (Comment) Care giver support system in place?: No (comment)   Criminal Activity/Legal Involvement Pertinent to Current Situation/Hospitalization: No  - Comment as needed  Activities of Daily Living Home Assistive Devices/Equipment: None ADL Screening (condition at time of admission) Patient's cognitive ability adequate to safely complete daily activities?: Yes Is the patient deaf or have difficulty hearing?: No Does the patient have difficulty seeing, even when wearing glasses/contacts?: No Does the patient have difficulty concentrating, remembering, or making decisions?: No Patient able to express need for assistance with ADLs?: Yes Does the patient have difficulty dressing or bathing?: No Independently performs ADLs?: Yes (appropriate for developmental age) Does the patient have difficulty walking or climbing stairs?: No Weakness of Legs: None Weakness of Arms/Hands: Right  Permission Sought/Granted   Permission granted to share information with : Yes, Verbal Permission Granted  Share Information with NAME: Baldwin Crown     Permission granted to share info w Relationship: son  Permission granted to share info w Contact Information: [phone]  Emotional Assessment Appearance:: Appears older than stated age Attitude/Demeanor/Rapport: Engaged, Self-Confident Affect (typically observed): Accepting, Adaptable Orientation: : Oriented to Self, Oriented to Place, Oriented to  Time, Oriented to Situation Alcohol / Substance Use: Not Applicable Psych Involvement: No (comment)  Admission diagnosis:  Right arm weakness [R29.898] Cerebrovascular accident (CVA), unspecified mechanism ([place]) [I63.9] Acute ischemic stroke [facility]) [I63.9] Patient Active Problem List   Diagnosis Date Noted  . Acute ischemic stroke ([place]) 11/03/2019  . Right arm weakness 11/02/2019  . Pap smear abnormality of cervix/human papillomavirus (HPV) positive 07/03/2018  . Class 1 obesity due to excess calories with serious  comorbidity and body mass index (BMI) of 33.0 to 33.9 in adult 04/20/2018  . 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 Tobacco abuse  11/24/2015  . Heel pain, bilateral 10/24/2015  . Arthritis   . COPD (chronic obstructive pulmonary disease) ([place])   . GERD (gastroesophageal reflux disease)   . Upper GI bleed 07/05/2015  . History of breast cancer 04/25/2015   PCP:  Valerie Roys, DO Pharmacy:   [id number] - [place], [address] [place] [address] Phone: [phone] Fax: [phone]     Social Determinants of Health (SDOH) Interventions    Readmission Risk Interventions No flowsheet data found.

## 2019-11-04 NOTE — Progress Notes (Signed)
*  PRELIMINARY RESULTS* Echocardiogram 2D Echocardiogram has been performed. Bubble Study performed. Definity IV Contrast used on this study.  Meagan York Meagan York 11/04/2019, 12:34 PM

## 2019-11-04 NOTE — Plan of Care (Signed)
  Problem: Education: Goal: Knowledge of General Education information will improve Description: Including pain rating scale, medication(s)/side effects and non-pharmacologic comfort measures Outcome: Adequate for Discharge   Problem: Health Behavior/Discharge Planning: Goal: Ability to manage health-related needs will improve Outcome: Adequate for Discharge   Problem: Clinical Measurements: Goal: Ability to maintain clinical measurements within normal limits will improve Outcome: Adequate for Discharge Goal: Will remain free from infection Outcome: Adequate for Discharge Goal: Diagnostic test results will improve Outcome: Adequate for Discharge Goal: Respiratory complications will improve Outcome: Adequate for Discharge Goal: Cardiovascular complication will be avoided Outcome: Adequate for Discharge   Problem: Activity: Goal: Risk for activity intolerance will decrease Outcome: Adequate for Discharge   Problem: Nutrition: Goal: Adequate nutrition will be maintained Outcome: Adequate for Discharge   Problem: Coping: Goal: Level of anxiety will decrease Outcome: Adequate for Discharge   Problem: Elimination: Goal: Will not experience complications related to bowel motility Outcome: Adequate for Discharge Goal: Will not experience complications related to urinary retention Outcome: Adequate for Discharge   Problem: Pain Managment: Goal: General experience of comfort will improve Outcome: Adequate for Discharge   Problem: Safety: Goal: Ability to remain free from injury will improve Outcome: Adequate for Discharge   Problem: Skin Integrity: Goal: Risk for impaired skin integrity will decrease Outcome: Adequate for Discharge   Problem: Education: Goal: Knowledge of disease or condition will improve Outcome: Adequate for Discharge Goal: Knowledge of secondary prevention will improve Outcome: Adequate for Discharge Goal: Knowledge of patient specific risk factors  addressed and post discharge goals established will improve Outcome: Adequate for Discharge   Problem: Health Behavior/Discharge Planning: Goal: Ability to manage health-related needs will improve Outcome: Adequate for Discharge   Problem: Self-Care: Goal: Ability to participate in self-care as condition permits will improve Outcome: Adequate for Discharge Goal: Verbalization of feelings and concerns over difficulty with self-care will improve Outcome: Adequate for Discharge Goal: Ability to communicate needs accurately will improve Outcome: Adequate for Discharge   Problem: Nutrition: Goal: Risk of aspiration will decrease Outcome: Adequate for Discharge Goal: Dietary intake will improve Outcome: Adequate for Discharge

## 2019-11-04 NOTE — Progress Notes (Signed)
Patient contacted by request of PT to discuss outpatient PT recommendation and anticipated co-pays. Patient contacted by phone who stated that she is unsure if she will be able to afford the co-pay for outpatient therapy. Due to co-pays varying, it was highly recommended that patient contact customer service through her insurance company to confirm exact co-pay amount. Patient did not have her card with her. Conference call attempt made to contact her insurance company but we were not able to get through today. Patient agreed to contact her insurance company tomorrow to verify co-pay for outpatient physical therapy.   [place], [place] Work [phone]

## 2019-11-05 ENCOUNTER — Telehealth: Payer: Self-pay

## 2019-11-05 NOTE — Telephone Encounter (Signed)
I have made the 1st attempt to contact the patient or family member in charge, in order to follow up from recently being discharged from the hospital. I left a message on voicemail but I will make another attempt at a different time.  

## 2019-11-06 LAB — HIV ANTIBODY (ROUTINE TESTING W REFLEX): HIV Screen 4th Generation wRfx: NONREACTIVE — AB

## 2019-11-07 NOTE — Telephone Encounter (Signed)
I have made the 2nd attempt to contact the patient or family member in charge, in order to follow up from recently being discharged from the hospital.  

## 2019-11-08 ENCOUNTER — Other Ambulatory Visit: Payer: Self-pay

## 2019-11-08 ENCOUNTER — Ambulatory Visit (INDEPENDENT_AMBULATORY_CARE_PROVIDER_SITE_OTHER): Payer: 59 | Admitting: Family Medicine

## 2019-11-08 ENCOUNTER — Encounter: Payer: Self-pay | Admitting: Family Medicine

## 2019-11-08 VITALS — BP 118/81 | HR 109 | Temp 98.3°F | Ht 65.0 in | Wt 206.0 lb

## 2019-11-08 DIAGNOSIS — I129 Hypertensive chronic kidney disease with stage 1 through stage 4 chronic kidney disease, or unspecified chronic kidney disease: Secondary | ICD-10-CM

## 2019-11-08 DIAGNOSIS — E782 Mixed hyperlipidemia: Secondary | ICD-10-CM

## 2019-11-08 DIAGNOSIS — J449 Chronic obstructive pulmonary disease, unspecified: Secondary | ICD-10-CM

## 2019-11-08 DIAGNOSIS — I639 Cerebral infarction, unspecified: Secondary | ICD-10-CM

## 2019-11-08 DIAGNOSIS — F329 Major depressive disorder, single episode, unspecified: Secondary | ICD-10-CM

## 2019-11-08 DIAGNOSIS — Z720 Tobacco use: Secondary | ICD-10-CM

## 2019-11-08 MED ORDER — TELMISARTAN 80 MG PO TABS: 80.0000 mg | ORAL_TABLET | Freq: Every day | ORAL | 1 refills | Status: DC

## 2019-11-08 MED ORDER — BUPROPION HCL ER (XL) 150 MG PO TB24: 150.0000 mg | ORAL_TABLET | Freq: Every day | ORAL | 0 refills | Status: DC

## 2019-11-08 MED ORDER — AMLODIPINE BESYLATE 5 MG PO TABS: 5.0000 mg | ORAL_TABLET | Freq: Every day | ORAL | 1 refills | Status: DC

## 2019-11-08 NOTE — Progress Notes (Signed)
BP 118/81   Pulse (!) 109   Temp 98.3 F (36.8 C) (Oral)   Ht 5\' 5"  (1.651 m)   Wt 206 lb (93.4 kg)   SpO2 97%   BMI 34.28 kg/m    Subjective:    Patient ID: Meagan York, female    DOB: 1955/12/18, 64 y.o.   MRN: [id number]  HPI: Meagan York is a 64 y.o. female  Chief Complaint  Patient presents with  . Hospitalization Follow-up  . Hypertension    amlodipine refill. pt states she stoped taking about a year ago   Patient presenting today for hospital follow up for CVA. Had right arm weakness that started a couple days prior to hospitalization, and mild weakness in right leg. MRI detected acute infarct of left corona radiata.Echocardiogram and carotid dopplers without significant findings, labs significant for elevated LDL at 140. Discharged home on aspirin, plavix, and lipitor which so far she is tolerating well. The arm weakness has been her only lingering symptom since d/c from hospital. Has been out of work due to another medical issue for several weeks now, needs work note stating that this is an unrelated event to her foot issues. She has provided fax number.   BPs were noted to be elevated in hospital. States she'd been pretty consistent taking her telmisartan but didn't realize she was to continue the amlodipine once that was added. Has now added it back on with good control of BPs since d/c.   Had been non compliant with inhalers for COPD, trying to be more consistent with them. Wearing a nicotine patch and son is helping her cut back on her smoking - used to be 2 ppd, tapering down slowly.   Has been struggling with moods lately, finances tough with being out of work for these medical conditions and now very concerned about health and strength of arm along with some other personal issues going on. Crying often, unmotivated to get up and do anything, experiencing anhedonia often the past month or so.  Denies SI/HI.   Transition of Care Hospital Follow up.    Hospital/Facility: [facility] D/C Physician: Dr. Billie Ruddy D/C Date: 11/04/19  Records Requested: in chart Records Received: immediately Records Reviewed: 11/08/19  Diagnoses on Discharge: right arm weakness, acute ischemic stroke  Date of interactive Contact within 48 hours of discharge: 11/05/19 Contact was through: phone  Date of 7 day or 14 day face-to-face visit:    within 7 days  Outpatient Encounter Medications as of 11/08/2019  Medication Sig Note  . albuterol (PROAIR HFA) 108 (90 Base) MCG/ACT inhaler Inhale 1-2 puffs into the lungs every 4 (four) hours as needed for wheezing or shortness of breath.   Marland Kitchen aspirin EC 81 MG tablet Take 1 tablet (81 mg total) by mouth daily.   Marland Kitchen atorvastatin (LIPITOR) 40 MG tablet Take 1 tablet (40 mg total) by mouth daily at 6 PM.   . budesonide-formoterol (SYMBICORT) 160-4.5 MCG/ACT inhaler Inhale 2 puffs into the lungs 2 (two) times daily.   . celecoxib (CELEBREX) 200 MG capsule Take 1 capsule (200 mg total) by mouth daily as needed.   . Cholecalciferol (VITAMIN D3) 1000 units CAPS Take 1,000 Units by mouth daily. 06/17/2016: Received from: [facility]: Take 1,000 Units by mouth once daily.  . clopidogrel (PLAVIX) 75 MG tablet Take 1 tablet (75 mg total) by mouth daily for 21 days. Then stop.   . nicotine (NICODERM CQ - DOSED IN MG/24 HOURS)  21 mg/24hr patch Place 1 patch (21 mg total) onto the skin daily.   . pantoprazole (PROTONIX) 40 MG tablet Take 1 tablet (40 mg total) by mouth 2 (two) times daily.   Marland Kitchen Spacer/Aero-Holding Chambers DEVI 1 each by Does not apply route daily. 05/31/2019: As needed  . telmisartan (MICARDIS) 80 MG tablet Take 1 tablet (80 mg total) by mouth daily.   . Tiotropium Bromide Monohydrate (SPIRIVA RESPIMAT) 2.5 MCG/ACT AERS Inhale 1 puff into the lungs daily.   . [DISCONTINUED] telmisartan (MICARDIS) 80 MG tablet Take 1 tablet (80 mg total) by mouth daily.   Marland Kitchen amLODipine (NORVASC) 5 MG tablet Take 1 tablet  (5 mg total) by mouth daily.   Marland Kitchen buPROPion (WELLBUTRIN XL) 150 MG 24 hr tablet Take 1 tablet (150 mg total) by mouth daily.   . [DISCONTINUED] amLODipine (NORVASC) 5 MG tablet TAKE 1 TABLET BY MOUTH EVERY DAY (Patient not taking: No sig reported)    No facility-administered encounter medications on file as of 11/08/2019.    Diagnostic Tests Reviewed/Disposition: Labs, echocardiogram, carotid dopplers b/l, labs  Consults: Neurology  Discharge Instructions: start aspirin, plavix, lipitor, control BPs with telmisartan and amlodipine, stop smoking  Disease/illness Education: provided  Home Health/Community Services Discussions/Referrals: n/a  Nurse, mental health or re-establishment of referral orders for community resources: n/a  Discussion with other health care providers: n/a  Assessment and Support of treatment regimen adherence: excellent since d/c  Appointments Coordinated with:  Neurology as outpatient, PT  Education for self-management, independent living, and ADLs: given  Relevant past medical, surgical, family and social history reviewed and updated as indicated. Interim medical history since our last visit reviewed. Allergies and medications reviewed and updated.  Review of Systems  Per HPI unless specifically indicated above     Objective:    BP 118/81   Pulse (!) 109   Temp 98.3 F (36.8 C) (Oral)   Ht 5\' 5"  (1.651 m)   Wt 206 lb (93.4 kg)   SpO2 97%   BMI 34.28 kg/m   Wt Readings from Last 3 Encounters:  11/08/19 206 lb (93.4 kg)  11/04/19 203 lb 4.8 oz (92.2 kg)  10/18/19 206 lb (93.4 kg)    Physical Exam Vitals and nursing note reviewed.  Constitutional:      Appearance: Normal appearance. She is not ill-appearing.  HENT:     Head: Atraumatic.  Eyes:     Extraocular Movements: Extraocular movements intact.     Conjunctiva/sclera: Conjunctivae normal.  Cardiovascular:     Rate and Rhythm: Normal rate and regular rhythm.     Heart sounds: Normal heart  sounds.  Pulmonary:     Effort: Pulmonary effort is normal.     Breath sounds: Normal breath sounds.  Musculoskeletal:     Cervical back: Normal range of motion and neck supple.     Comments: Right arm reduced ROM beyond 90 degree extension  Skin:    General: Skin is warm and dry.  Neurological:     Mental Status: She is alert and oriented to person, place, and time.     Motor: Weakness (right arm with reduced grip strength) present.  Psychiatric:        Mood and Affect: Mood normal.        Thought Content: Thought content normal.        Judgment: Judgment normal.     Results for orders placed or performed during the hospital encounter of 11/02/19  SARS CORONAVIRUS 2 (TAT 6-24 HRS) Nasopharyngeal Nasopharyngeal  Swab   Specimen: Nasopharyngeal Swab  Result Value Ref Range   SARS Coronavirus 2 NEGATIVE NEGATIVE  Protime-INR  Result Value Ref Range   Prothrombin Time 12.3 11.4 - 15.2 seconds   INR 0.9 0.8 - 1.2  APTT  Result Value Ref Range   aPTT 29 24 - 36 seconds  CBC  Result Value Ref Range   WBC 7.8 4.0 - 10.5 K/uL   RBC 4.18 3.87 - 5.11 MIL/uL   Hemoglobin 12.7 12.0 - 15.0 g/dL   HCT 39.5 36.0 - 46.0 %   MCV 94.5 80.0 - 100.0 fL   MCH 30.4 26.0 - 34.0 pg   MCHC 32.2 30.0 - 36.0 g/dL   RDW 11.9 11.5 - 15.5 %   Platelets 307 150 - 400 K/uL   nRBC 0.0 0.0 - 0.2 %  Differential  Result Value Ref Range   Neutrophils Relative % 63 %   Neutro Abs 5.1 1.7 - 7.7 K/uL   Lymphocytes Relative 25 %   Lymphs Abs 2.0 0.7 - 4.0 K/uL   Monocytes Relative 8 %   Monocytes Absolute 0.6 0.1 - 1.0 K/uL   Eosinophils Relative 2 %   Eosinophils Absolute 0.1 0.0 - 0.5 K/uL   Basophils Relative 1 %   Basophils Absolute 0.1 0.0 - 0.1 K/uL   Immature Granulocytes 1 %   Abs Immature Granulocytes 0.04 0.00 - 0.07 K/uL  Comprehensive metabolic panel  Result Value Ref Range   Sodium 136 135 - 145 mmol/L   Potassium 3.5 3.5 - 5.1 mmol/L   Chloride 98 98 - 111 mmol/L   CO2 28 22 - 32  mmol/L   Glucose, Bld 100 (H) 70 - 99 mg/dL   BUN 6 (L) 8 - 23 mg/dL   Creatinine, Ser 0.59 0.44 - 1.00 mg/dL   Calcium 8.7 (L) 8.9 - 10.3 mg/dL   Total Protein 7.4 6.5 - 8.1 g/dL   Albumin 3.6 3.5 - 5.0 g/dL   AST 29 15 - 41 U/L   ALT 34 0 - 44 U/L   Alkaline Phosphatase 86 38 - 126 U/L   Total Bilirubin 0.9 0.3 - 1.2 mg/dL   GFR calc non Af Amer >60 >60 mL/min   GFR calc Af Amer >60 >60 mL/min   Anion gap 10 5 - 15  Hemoglobin A1c  Result Value Ref Range   Hgb A1c MFr Bld 5.2 4.8 - 5.6 %   Mean Plasma Glucose 102.54 mg/dL  Lipid panel  Result Value Ref Range   Cholesterol 222 (H) 0 - 200 mg/dL   Triglycerides 119 <150 mg/dL   HDL 58 >40 mg/dL   Total CHOL/HDL Ratio 3.8 RATIO   VLDL 24 0 - 40 mg/dL   LDL Cholesterol 140 (H) 0 - 99 mg/dL  TSH  Result Value Ref Range   TSH 2.357 0.350 - 4.500 uIU/mL  HIV Antibody (routine testing w rflx)  Result Value Ref Range   HIV Screen 4th Generation wRfx Non Reactive (A) Non Reactive  Basic metabolic panel  Result Value Ref Range   Sodium 139 135 - 145 mmol/L   Potassium 3.8 3.5 - 5.1 mmol/L   Chloride 103 98 - 111 mmol/L   CO2 27 22 - 32 mmol/L   Glucose, Bld 101 (H) 70 - 99 mg/dL   BUN 8 8 - 23 mg/dL   Creatinine, Ser 0.64 0.44 - 1.00 mg/dL   Calcium 8.4 (L) 8.9 - 10.3 mg/dL   GFR calc non  Af Amer >60 >60 mL/min   GFR calc Af Amer >60 >60 mL/min   Anion gap 9 5 - 15  CBC  Result Value Ref Range   WBC 6.6 4.0 - 10.5 K/uL   RBC 3.85 (L) 3.87 - 5.11 MIL/uL   Hemoglobin 11.7 (L) 12.0 - 15.0 g/dL   HCT 36.5 36.0 - 46.0 %   MCV 94.8 80.0 - 100.0 fL   MCH 30.4 26.0 - 34.0 pg   MCHC 32.1 30.0 - 36.0 g/dL   RDW 11.9 11.5 - 15.5 %   Platelets 269 150 - 400 K/uL   nRBC 0.0 0.0 - 0.2 %  Magnesium  Result Value Ref Range   Magnesium 2.1 1.7 - 2.4 mg/dL      Assessment & Plan:   Problem List Items Addressed This Visit      Cardiovascular and Mediastinum   Acute ischemic stroke ([place]) - Primary    Overall doing well since  d/c, aside from persistent right arm weakness and decreased ROM. WIll refer to Neurology and Physical Therapy. Work note provided as there is no light duty option that is feasible. Continue current regimen with close home monitoring of BPs, diet and exercise, and continued efforts toward smoking cessation.       Relevant Medications   telmisartan (MICARDIS) 80 MG tablet   amLODipine (NORVASC) 5 MG tablet   Other Relevant Orders   Ambulatory referral to Neurology   Ambulatory referral to Physical Therapy     Respiratory   COPD (chronic obstructive pulmonary disease) ([place])    Work on compliance with inhaler regimen for improved control        Genitourinary   Benign hypertensive renal disease    Improved with re-addition of amlodipine, which patient did not understand she was to continue taking. Continue close home monitoring and lifestyle modifications        Other   Tobacco abuse    Using nicotine patches and tapering down slowly, continue current regimen      Hyperlipidemia    So far tolerating lipitor well, continue lifestyle modifications for increased control      Relevant Medications   telmisartan (MICARDIS) 80 MG tablet   amLODipine (NORVASC) 5 MG tablet   Depression    Secondary to numerous medical issues back to back and extended time off work contributing to financial concerns. Trial wellbutrin, increase exercise. Recheck 1 month      Relevant Medications   buPROPion (WELLBUTRIN XL) 150 MG 24 hr tablet       Follow up plan: Return in about 4 weeks (around 12/06/2019) for Mood f/u.

## 2019-11-12 ENCOUNTER — Other Ambulatory Visit: Payer: Self-pay | Admitting: Family Medicine

## 2019-11-12 DIAGNOSIS — F329 Major depressive disorder, single episode, unspecified: Secondary | ICD-10-CM | POA: Insufficient documentation

## 2019-11-12 DIAGNOSIS — E785 Hyperlipidemia, unspecified: Secondary | ICD-10-CM | POA: Insufficient documentation

## 2019-11-12 DIAGNOSIS — F32A Depression, unspecified: Secondary | ICD-10-CM | POA: Insufficient documentation

## 2019-11-12 MED ORDER — SPIRIVA RESPIMAT 2.5 MCG/ACT IN AERS: 1.0000 | INHALATION_SPRAY | Freq: Every day | RESPIRATORY_TRACT | 6 refills | Status: DC

## 2019-11-12 NOTE — Telephone Encounter (Signed)
Patient seen 11/08/19.

## 2019-11-12 NOTE — Assessment & Plan Note (Signed)
So far tolerating lipitor well, continue lifestyle modifications for increased control

## 2019-11-12 NOTE — Assessment & Plan Note (Signed)
Secondary to numerous medical issues back to back and extended time off work contributing to financial concerns. Trial wellbutrin, increase exercise. Recheck 1 month

## 2019-11-12 NOTE — Assessment & Plan Note (Signed)
Using nicotine patches and tapering down slowly, continue current regimen

## 2019-11-12 NOTE — Assessment & Plan Note (Signed)
Work on compliance with inhaler regimen for improved control

## 2019-11-12 NOTE — Telephone Encounter (Signed)
Pt is requesting a refill for Tiotropium Bromide Monohydrate (SPIRIVA RESPIMAT) 2.5 MCG/ACT AERS      Pharmacy:  [id number] - [place], [place] Phone:  [phone]  Fax:  [phone]

## 2019-11-12 NOTE — Assessment & Plan Note (Signed)
Improved with re-addition of amlodipine, which patient did not understand she was to continue taking. Continue close home monitoring and lifestyle modifications

## 2019-11-12 NOTE — Assessment & Plan Note (Signed)
Overall doing well since d/c, aside from persistent right arm weakness and decreased ROM. WIll refer to Neurology and Physical Therapy. Work note provided as there is no light duty option that is feasible. Continue current regimen with close home monitoring of BPs, diet and exercise, and continued efforts toward smoking cessation.

## 2019-12-04 ENCOUNTER — Telehealth: Payer: Self-pay

## 2019-12-04 NOTE — Telephone Encounter (Signed)
Message left notifying patient that it is time to schedule the low dose lung cancer screening CT scan.  Instructed patient to return call to Shawn Perkins at [phone] to verify information prior to CT scan being scheduled.    

## 2019-12-06 ENCOUNTER — Ambulatory Visit (INDEPENDENT_AMBULATORY_CARE_PROVIDER_SITE_OTHER): Payer: 59 | Admitting: Family Medicine

## 2019-12-06 ENCOUNTER — Other Ambulatory Visit: Payer: Self-pay

## 2019-12-06 ENCOUNTER — Encounter: Payer: Self-pay | Admitting: Family Medicine

## 2019-12-06 VITALS — BP 106/73 | HR 89 | Temp 98.1°F | Ht 65.0 in | Wt 201.0 lb

## 2019-12-06 DIAGNOSIS — G2581 Restless legs syndrome: Secondary | ICD-10-CM

## 2019-12-06 DIAGNOSIS — F329 Major depressive disorder, single episode, unspecified: Secondary | ICD-10-CM | POA: Diagnosis not present

## 2019-12-06 MED ORDER — BUPROPION HCL ER (XL) 300 MG PO TB24: 300.0000 mg | ORAL_TABLET | Freq: Every day | ORAL | 0 refills | Status: DC

## 2019-12-06 NOTE — Progress Notes (Signed)
BP 106/73   Pulse 89   Temp 98.1 F (36.7 C) (Oral)   Ht 5\' 5"  (1.651 m)   Wt 201 lb (91.2 kg)   SpO2 98%   BMI 33.45 kg/m    Subjective:    Patient ID: Meagan York, female    DOB: 02-25-1956, 64 y.o.   MRN: [id number]  HPI: Meagan York is a 64 y.o. female  Chief Complaint  Patient presents with  . Depression   Presenting today for 1 month mood f/u after adding wellbutrin. Feeling better with the wellbutrin, moods elevated with it and feels energy levels doing better. Denies side effects, though notes the past 2 days hasn't slept well and has had RLS which is not typical for her. Does not feel this is related to the medicine. Denies SI/HI.   Depression screen [facility] 2/9 12/06/2019 06/26/2018 04/20/2018  Decreased Interest 1 0 3  Down, Depressed, Hopeless 0 0 0  PHQ - 2 Score 1 0 3  Altered sleeping 1 3 3   Tired, decreased energy 1 3 3   Change in appetite 0 0 1  Feeling bad or failure about yourself  0 0 1  Trouble concentrating 0 0 2  Moving slowly or fidgety/restless 0 0 0  Suicidal thoughts 0 0 0  PHQ-9 Score 3 6 13   Difficult doing work/chores - - Somewhat difficult  No flowsheet data found.  Relevant past medical, surgical, family and social history reviewed and updated as indicated. Interim medical history since our last visit reviewed. Allergies and medications reviewed and updated.  Review of Systems  Per HPI unless specifically indicated above     Objective:    BP 106/73   Pulse 89   Temp 98.1 F (36.7 C) (Oral)   Ht 5\' 5"  (1.651 m)   Wt 201 lb (91.2 kg)   SpO2 98%   BMI 33.45 kg/m   Wt Readings from Last 3 Encounters:  12/06/19 201 lb (91.2 kg)  11/08/19 206 lb (93.4 kg)  11/04/19 203 lb 4.8 oz (92.2 kg)    Physical Exam Vitals and nursing note reviewed.  Constitutional:      Appearance: Normal appearance. She is not ill-appearing.  HENT:     Head: Atraumatic.  Eyes:     Extraocular Movements: Extraocular movements intact.   Conjunctiva/sclera: Conjunctivae normal.  Cardiovascular:     Rate and Rhythm: Normal rate and regular rhythm.     Heart sounds: Normal heart sounds.  Pulmonary:     Effort: Pulmonary effort is normal.     Breath sounds: Normal breath sounds.  Musculoskeletal:        General: Normal range of motion.     Cervical back: Normal range of motion and neck supple.  Skin:    General: Skin is warm and dry.  Neurological:     Mental Status: She is alert and oriented to person, place, and time.  Psychiatric:        Mood and Affect: Mood normal.        Thought Content: Thought content normal.        Judgment: Judgment normal.     Results for orders placed or performed during the hospital encounter of 11/02/19  SARS CORONAVIRUS 2 (TAT 6-24 HRS) Nasopharyngeal Nasopharyngeal Swab   Specimen: Nasopharyngeal Swab  Result Value Ref Range   SARS Coronavirus 2 NEGATIVE NEGATIVE  Protime-INR  Result Value Ref Range   Prothrombin Time 12.3 11.4 - 15.2 seconds   INR 0.9  0.8 - 1.2  APTT  Result Value Ref Range   aPTT 29 24 - 36 seconds  CBC  Result Value Ref Range   WBC 7.8 4.0 - 10.5 K/uL   RBC 4.18 3.87 - 5.11 MIL/uL   Hemoglobin 12.7 12.0 - 15.0 g/dL   HCT 39.5 36.0 - 46.0 %   MCV 94.5 80.0 - 100.0 fL   MCH 30.4 26.0 - 34.0 pg   MCHC 32.2 30.0 - 36.0 g/dL   RDW 11.9 11.5 - 15.5 %   Platelets 307 150 - 400 K/uL   nRBC 0.0 0.0 - 0.2 %  Differential  Result Value Ref Range   Neutrophils Relative % 63 %   Neutro Abs 5.1 1.7 - 7.7 K/uL   Lymphocytes Relative 25 %   Lymphs Abs 2.0 0.7 - 4.0 K/uL   Monocytes Relative 8 %   Monocytes Absolute 0.6 0.1 - 1.0 K/uL   Eosinophils Relative 2 %   Eosinophils Absolute 0.1 0.0 - 0.5 K/uL   Basophils Relative 1 %   Basophils Absolute 0.1 0.0 - 0.1 K/uL   Immature Granulocytes 1 %   Abs Immature Granulocytes 0.04 0.00 - 0.07 K/uL  Comprehensive metabolic panel  Result Value Ref Range   Sodium 136 135 - 145 mmol/L   Potassium 3.5 3.5 - 5.1  mmol/L   Chloride 98 98 - 111 mmol/L   CO2 28 22 - 32 mmol/L   Glucose, Bld 100 (H) 70 - 99 mg/dL   BUN 6 (L) 8 - 23 mg/dL   Creatinine, Ser 0.59 0.44 - 1.00 mg/dL   Calcium 8.7 (L) 8.9 - 10.3 mg/dL   Total Protein 7.4 6.5 - 8.1 g/dL   Albumin 3.6 3.5 - 5.0 g/dL   AST 29 15 - 41 U/L   ALT 34 0 - 44 U/L   Alkaline Phosphatase 86 38 - 126 U/L   Total Bilirubin 0.9 0.3 - 1.2 mg/dL   GFR calc non Af Amer >60 >60 mL/min   GFR calc Af Amer >60 >60 mL/min   Anion gap 10 5 - 15  Hemoglobin A1c  Result Value Ref Range   Hgb A1c MFr Bld 5.2 4.8 - 5.6 %   Mean Plasma Glucose 102.54 mg/dL  Lipid panel  Result Value Ref Range   Cholesterol 222 (H) 0 - 200 mg/dL   Triglycerides 119 <150 mg/dL   HDL 58 >40 mg/dL   Total CHOL/HDL Ratio 3.8 RATIO   VLDL 24 0 - 40 mg/dL   LDL Cholesterol 140 (H) 0 - 99 mg/dL  TSH  Result Value Ref Range   TSH 2.357 0.350 - 4.500 uIU/mL  HIV Antibody (routine testing w rflx)  Result Value Ref Range   HIV Screen 4th Generation wRfx Non Reactive (A) Non Reactive  Basic metabolic panel  Result Value Ref Range   Sodium 139 135 - 145 mmol/L   Potassium 3.8 3.5 - 5.1 mmol/L   Chloride 103 98 - 111 mmol/L   CO2 27 22 - 32 mmol/L   Glucose, Bld 101 (H) 70 - 99 mg/dL   BUN 8 8 - 23 mg/dL   Creatinine, Ser 0.64 0.44 - 1.00 mg/dL   Calcium 8.4 (L) 8.9 - 10.3 mg/dL   GFR calc non Af Amer >60 >60 mL/min   GFR calc Af Amer >60 >60 mL/min   Anion gap 9 5 - 15  CBC  Result Value Ref Range   WBC 6.6 4.0 - 10.5 K/uL  RBC 3.85 (L) 3.87 - 5.11 MIL/uL   Hemoglobin 11.7 (L) 12.0 - 15.0 g/dL   HCT 36.5 36.0 - 46.0 %   MCV 94.8 80.0 - 100.0 fL   MCH 30.4 26.0 - 34.0 pg   MCHC 32.1 30.0 - 36.0 g/dL   RDW 11.9 11.5 - 15.5 %   Platelets 269 150 - 400 K/uL   nRBC 0.0 0.0 - 0.2 %  Magnesium  Result Value Ref Range   Magnesium 2.1 1.7 - 2.4 mg/dL      Assessment & Plan:   Problem List Items Addressed This Visit      Other   Depression - Primary    Improved on  wellbutrin, increase to 300 mg and continue to monitor closely      Relevant Medications   buPROPion (WELLBUTRIN XL) 300 MG 24 hr tablet    Other Visit Diagnoses    Restless legs       Discussed good hydration, OTC supplements, monitoring for resolution. Unlikely side effect of wellbutrin given timing but if persistent may need to reconsider       Follow up plan: Return in about 3 months (around 03/07/2020) for 6 month f/u.

## 2019-12-07 NOTE — Assessment & Plan Note (Signed)
Improved on wellbutrin, increase to 300 mg and continue to monitor closely

## 2019-12-20 ENCOUNTER — Other Ambulatory Visit: Payer: Self-pay | Admitting: Family Medicine

## 2019-12-20 ENCOUNTER — Encounter: Payer: Self-pay | Admitting: *Deleted

## 2019-12-20 NOTE — Telephone Encounter (Signed)
Patient last seen 12/06/19. Last RX in chart says no print so it was not sent to the pharmacy.

## 2019-12-20 NOTE — Telephone Encounter (Signed)
Requested  medications are  due for refill today yes  Requested medications are on the active medication list - MED LIST STATES AS NEEDED, RX IS SCHEDULED  Last refill 3/3  Future visit scheduled na  Notes to clinic Unsure due to difference in Rx signature vs med list

## 2019-12-25 ENCOUNTER — Other Ambulatory Visit: Payer: Self-pay | Admitting: Family Medicine

## 2019-12-25 MED ORDER — PANTOPRAZOLE SODIUM 40 MG PO TBEC: 40.0000 mg | DELAYED_RELEASE_TABLET | Freq: Two times a day (BID) | ORAL | 1 refills | Status: DC

## 2019-12-25 NOTE — Telephone Encounter (Signed)
Medication Refill - Medication: pantoprazole   Has the patient contacted their pharmacy? Yes.   Pt states that her insurance is no longer covering this medication. She is requesting a PA be sent in or an alternate medication. Please advise.  (Agent: If no, request that the patient contact the pharmacy for the refill.) (Agent: If yes, when and what did the pharmacy advise?)  Preferred Pharmacy (with phone number or street name):  [id number] - [place], [place] [address]  [address] [place] [address]  Phone: [phone] Fax: [phone]  Not a 24 hour pharmacy; exact hours not known.     Agent: Please be advised that RX refills may take up to 3 business days. We ask that you follow-up with your pharmacy.

## 2019-12-25 NOTE — Telephone Encounter (Signed)
Requested medication (s) are due for refill today:   Yes  Requested medication (s) are on the active medication list:   Yes  Future visit scheduled:   Yes   Last ordered: 01/18/2019  #180  1 refill  Clinic note:   Her insurance is no longer  covering this medication.   She needs a PA or an alternative.   Requested Prescriptions  Pending Prescriptions Disp Refills   pantoprazole (PROTONIX) 40 MG tablet 180 tablet 1    Sig: Take 1 tablet (40 mg total) by mouth 2 (two) times daily.      Gastroenterology: Proton Pump Inhibitors Passed - 12/25/2019 11:58 AM      Passed - Valid encounter within last 12 months    Recent Outpatient Visits           2 weeks ago Reactive depression   [facility] [place], [place]   1 month ago Acute ischemic stroke [facility])   [facility] [place], [place]   2 months ago Right foot infection   [facility] [place], [place]   6 months ago Benign hypertensive renal disease   [facility] [place], [place]   6 months ago Preoperative clearance   [facility] [place], [place]       Future Appointments             In 2 months Wynetta Emery, Barb Merino, DO [employer], [facility]

## 2019-12-25 NOTE — Telephone Encounter (Signed)
Patient last seen 11/08/19 and has appointment 03/06/20

## 2019-12-28 ENCOUNTER — Telehealth: Payer: Self-pay | Admitting: Family Medicine

## 2019-12-28 ENCOUNTER — Encounter: Payer: Self-pay | Admitting: Family Medicine

## 2019-12-28 ENCOUNTER — Ambulatory Visit (INDEPENDENT_AMBULATORY_CARE_PROVIDER_SITE_OTHER): Payer: 59 | Admitting: Family Medicine

## 2019-12-28 VITALS — Temp 98.3°F | Wt 197.0 lb

## 2019-12-28 DIAGNOSIS — K219 Gastro-esophageal reflux disease without esophagitis: Secondary | ICD-10-CM

## 2019-12-28 DIAGNOSIS — J069 Acute upper respiratory infection, unspecified: Secondary | ICD-10-CM | POA: Diagnosis not present

## 2019-12-28 MED ORDER — PREDNISONE 50 MG PO TABS: 50.0000 mg | ORAL_TABLET | Freq: Every day | ORAL | 0 refills | Status: DC

## 2019-12-28 MED ORDER — ONDANSETRON 4 MG PO TBDP: 4.0000 mg | ORAL_TABLET | Freq: Three times a day (TID) | ORAL | 0 refills | Status: DC | PRN

## 2019-12-28 MED ORDER — OMEPRAZOLE 40 MG PO CPDR: 40.0000 mg | DELAYED_RELEASE_CAPSULE | Freq: Every day | ORAL | 3 refills | Status: DC

## 2019-12-28 NOTE — Progress Notes (Signed)
Temp 98.3 F (36.8 C) (Oral)   Wt 197 lb (89.4 kg)   BMI 32.78 kg/m    Subjective:    Patient ID: Meagan York, female    DOB: 07-30-1956, 64 y.o.   MRN: [id number]  HPI: Meagan York is a 64 y.o. female  Chief Complaint  Patient presents with  . Headache    x the past 2 days. has tried OTC sinus med, not helping.// work note for today requested  . URI  . Nausea  . Gastroesophageal Reflux    pt states that if she can get something else other than pantoprazole due to insurance not cover this med   UPPER RESPIRATORY TRACT INFECTION Duration: 1 day Worst symptom: headache Fever: no Cough: yes Shortness of breath: no Wheezing: no Chest pain: no Chest tightness: no Chest congestion: no Nasal congestion: yes Runny nose: yes Post nasal drip: no Sneezing: no Sore throat: no Swollen glands: no Sinus pressure: yes Headache: yes Face pain: no Toothache: no Ear pain: no  Ear pressure: no  Eyes red/itching:no Eye drainage/crusting: no  Vomiting: no Rash: no Fatigue: no Sick contacts: no Strep contacts: no  Context: worse Recurrent sinusitis: no Relief with OTC cold/cough medications: no  Treatments attempted: cold/sinus and anti-histamine   GERD GERD control status: uncontrolled Satisfied with current treatment? no Medication side effects: no  Medication compliance: good Dysphagia: no Odynophagia:  no Hematemesis: no Blood in stool: no EGD: no    Relevant past medical, surgical, family and social history reviewed and updated as indicated. Interim medical history since our last visit reviewed. Allergies and medications reviewed and updated.  Review of Systems  Constitutional: Negative.   HENT: Negative.   Respiratory: Negative.   Gastrointestinal: Positive for abdominal pain and nausea. Negative for abdominal distention, anal bleeding, blood in stool, constipation, diarrhea, rectal pain and vomiting.  Musculoskeletal: Negative.   Skin: Negative.     Neurological: Negative.   Psychiatric/Behavioral: Negative.     Per HPI unless specifically indicated above     Objective:    Temp 98.3 F (36.8 C) (Oral)   Wt 197 lb (89.4 kg)   BMI 32.78 kg/m   Wt Readings from Last 3 Encounters:  12/28/19 197 lb (89.4 kg)  12/06/19 201 lb (91.2 kg)  11/08/19 206 lb (93.4 kg)    Physical Exam Vitals and nursing note reviewed.  Pulmonary:     Effort: Pulmonary effort is normal. No respiratory distress.     Comments: Speaking in full sentences Neurological:     Mental Status: She is alert.  Psychiatric:        Mood and Affect: Mood normal.        Behavior: Behavior normal.        Thought Content: Thought content normal.        Judgment: Judgment normal.     Results for orders placed or performed during the hospital encounter of 11/02/19  SARS CORONAVIRUS 2 (TAT 6-24 HRS) Nasopharyngeal Nasopharyngeal Swab   Specimen: Nasopharyngeal Swab  Result Value Ref Range   SARS Coronavirus 2 NEGATIVE NEGATIVE  Protime-INR  Result Value Ref Range   Prothrombin Time 12.3 11.4 - 15.2 seconds   INR 0.9 0.8 - 1.2  APTT  Result Value Ref Range   aPTT 29 24 - 36 seconds  CBC  Result Value Ref Range   WBC 7.8 4.0 - 10.5 K/uL   RBC 4.18 3.87 - 5.11 MIL/uL   Hemoglobin 12.7 12.0 - 15.0 g/dL  HCT 39.5 36.0 - 46.0 %   MCV 94.5 80.0 - 100.0 fL   MCH 30.4 26.0 - 34.0 pg   MCHC 32.2 30.0 - 36.0 g/dL   RDW 11.9 11.5 - 15.5 %   Platelets 307 150 - 400 K/uL   nRBC 0.0 0.0 - 0.2 %  Differential  Result Value Ref Range   Neutrophils Relative % 63 %   Neutro Abs 5.1 1.7 - 7.7 K/uL   Lymphocytes Relative 25 %   Lymphs Abs 2.0 0.7 - 4.0 K/uL   Monocytes Relative 8 %   Monocytes Absolute 0.6 0.1 - 1.0 K/uL   Eosinophils Relative 2 %   Eosinophils Absolute 0.1 0.0 - 0.5 K/uL   Basophils Relative 1 %   Basophils Absolute 0.1 0.0 - 0.1 K/uL   Immature Granulocytes 1 %   Abs Immature Granulocytes 0.04 0.00 - 0.07 K/uL  Comprehensive metabolic  panel  Result Value Ref Range   Sodium 136 135 - 145 mmol/L   Potassium 3.5 3.5 - 5.1 mmol/L   Chloride 98 98 - 111 mmol/L   CO2 28 22 - 32 mmol/L   Glucose, Bld 100 (H) 70 - 99 mg/dL   BUN 6 (L) 8 - 23 mg/dL   Creatinine, Ser 0.59 0.44 - 1.00 mg/dL   Calcium 8.7 (L) 8.9 - 10.3 mg/dL   Total Protein 7.4 6.5 - 8.1 g/dL   Albumin 3.6 3.5 - 5.0 g/dL   AST 29 15 - 41 U/L   ALT 34 0 - 44 U/L   Alkaline Phosphatase 86 38 - 126 U/L   Total Bilirubin 0.9 0.3 - 1.2 mg/dL   GFR calc non Af Amer >60 >60 mL/min   GFR calc Af Amer >60 >60 mL/min   Anion gap 10 5 - 15  Hemoglobin A1c  Result Value Ref Range   Hgb A1c MFr Bld 5.2 4.8 - 5.6 %   Mean Plasma Glucose 102.54 mg/dL  Lipid panel  Result Value Ref Range   Cholesterol 222 (H) 0 - 200 mg/dL   Triglycerides 119 <150 mg/dL   HDL 58 >40 mg/dL   Total CHOL/HDL Ratio 3.8 RATIO   VLDL 24 0 - 40 mg/dL   LDL Cholesterol 140 (H) 0 - 99 mg/dL  TSH  Result Value Ref Range   TSH 2.357 0.350 - 4.500 uIU/mL  HIV Antibody (routine testing w rflx)  Result Value Ref Range   HIV Screen 4th Generation wRfx Non Reactive (A) Non Reactive  Basic metabolic panel  Result Value Ref Range   Sodium 139 135 - 145 mmol/L   Potassium 3.8 3.5 - 5.1 mmol/L   Chloride 103 98 - 111 mmol/L   CO2 27 22 - 32 mmol/L   Glucose, Bld 101 (H) 70 - 99 mg/dL   BUN 8 8 - 23 mg/dL   Creatinine, Ser 0.64 0.44 - 1.00 mg/dL   Calcium 8.4 (L) 8.9 - 10.3 mg/dL   GFR calc non Af Amer >60 >60 mL/min   GFR calc Af Amer >60 >60 mL/min   Anion gap 9 5 - 15  CBC  Result Value Ref Range   WBC 6.6 4.0 - 10.5 K/uL   RBC 3.85 (L) 3.87 - 5.11 MIL/uL   Hemoglobin 11.7 (L) 12.0 - 15.0 g/dL   HCT 36.5 36.0 - 46.0 %   MCV 94.8 80.0 - 100.0 fL   MCH 30.4 26.0 - 34.0 pg   MCHC 32.1 30.0 - 36.0 g/dL  RDW 11.9 11.5 - 15.5 %   Platelets 269 150 - 400 K/uL   nRBC 0.0 0.0 - 0.2 %  Magnesium  Result Value Ref Range   Magnesium 2.1 1.7 - 2.4 mg/dL      Assessment & Plan:    Problem List Items Addressed This Visit      Digestive   GERD (gastroesophageal reflux disease)    Will change to omeprazole. Call with any concerns. Continue to monitor.       Relevant Medications   omeprazole (PRILOSEC) 40 MG capsule   ondansetron (ZOFRAN ODT) 4 MG disintegrating tablet    Other Visit Diagnoses    Upper respiratory tract infection, unspecified type    -  Primary   Will get swabbed for COVID and self-quarantine. Will treat with prednisone. Call if not getting better or getting worse. Continue to monitor.    Relevant Orders   Novel Coronavirus, NAA ([facility])       Follow up plan: Return if symptoms worsen or fail to improve.    . This visit was completed via telephone due to the restrictions of the COVID-19 pandemic. All issues as above were discussed and addressed but no physical exam was performed. If it was felt that the patient should be evaluated in the office, they were directed there. The patient verbally consented to this visit. Patient was unable to complete an audio/visual visit due to Lack of equipment. Due to the catastrophic nature of the COVID-19 pandemic, this visit was done through audio contact only. . Location of the patient: home . Location of the provider: work . Those involved with this call:  . Provider: Park Liter, DO . CMA: Lauretta Grill, RMA . Front Desk/Registration: Don Perking  . Time spent on call: 25 minutes on the phone discussing health concerns. 40 minutes total spent in review of patient's record and preparation of their chart.

## 2019-12-28 NOTE — Telephone Encounter (Signed)
Scheduled virtual for today at 3:45

## 2019-12-28 NOTE — Telephone Encounter (Signed)
Copied from [place] [id number]. Topic: General - Other >> Dec 28, 2019 12:36 PM Keene Breath wrote: Reason for CRM: Patient called to ask if it is possible for her to be seen today because of a headache she has been having for a few days.  Please advise and call patient to discuss at [phone]

## 2019-12-28 NOTE — Patient Instructions (Signed)
To schedule a COVID test, please  text "COVID" to [phone], OR you can log on to [email or web address] to easily make an on-line appointment. If you do not have access to a smart phone or PC, you can call [phone] to get assistance.  

## 2019-12-30 ENCOUNTER — Encounter: Payer: Self-pay | Admitting: Family Medicine

## 2019-12-30 NOTE — Assessment & Plan Note (Signed)
Will change to omeprazole. Call with any concerns. Continue to monitor.

## 2019-12-31 ENCOUNTER — Telehealth: Payer: Self-pay

## 2019-12-31 NOTE — Telephone Encounter (Signed)
Called pt to inform her, work excuse note is at the front for pick up. No answer, lvm requesting call back to office.

## 2020-01-19 ENCOUNTER — Ambulatory Visit
Admission: EM | Admit: 2020-01-19 | Discharge: 2020-01-19 | Disposition: A | Discharge status: Home / Self Care | Payer: 59 | Attending: Family Medicine | Admitting: Family Medicine

## 2020-01-19 ENCOUNTER — Other Ambulatory Visit: Payer: Self-pay

## 2020-01-19 ENCOUNTER — Telehealth: Payer: Self-pay | Admitting: Physician Assistant

## 2020-01-19 DIAGNOSIS — U071 COVID-19: Secondary | ICD-10-CM | POA: Diagnosis present

## 2020-01-19 LAB — SARS CORONAVIRUS 2 AG (30 MIN TAT): SARS Coronavirus 2 Ag: POSITIVE — AB

## 2020-01-19 MED ORDER — HYDROCOD POLST-CPM POLST ER 10-8 MG/5ML PO SUER: 5.0000 mL | Freq: Two times a day (BID) | ORAL | 0 refills | Status: DC | PRN

## 2020-01-19 MED ORDER — PREDNISONE 20 MG PO TABS
40.0000 mg | ORAL_TABLET | Freq: Every day | ORAL | 0 refills | Status: DC
Start: 2020-01-19 — End: 2020-03-06

## 2020-01-19 NOTE — ED Provider Notes (Addendum)
[place], [place]   Name: Meagan York DOB: 03/26/1956 MRN: [id number] CSN: [id number] PCP: Valerie Roys, DO  Arrival date and time:  01/19/20 1027  Chief Complaint:  Cough and Nasal Congestion  NOTE: Prior to seeing the patient today, I have reviewed the triage nursing documentation and vital signs. Clinical staff has updated patient's PMH/PSHx, current medication list, and drug allergies/intolerances to ensure comprehensive history available to assist in medical decision making.   History:   HPI: Meagan York is a 64 y.o. female who presents today with complaints of fatigue, cough, and congestions that started approximately 3 days ago. She has had subjective fevers with (+) chills. Cough has been non-productive. Patient with shortness of breath at baseline due to her PMH (+) for COPD and asthma. No wheezing reported. She has experienced any nausea and diarrhea; denies vomiting and abdominal pain.  She notes a decreased appetite overall, however maintains the ability to tolerate oral fluids. Patient with (+) perceived alterations to her sense of taste or smell; "taste is off and my smell has never really been that great". Patient denies being in close contact with anyone known to be ill; no one else is her home has experienced a similar symptom constellation. Patient presented to [facility] yesterday, however she could not have testing completed as they had stopped testing for the day. Patient spoke with the pharmacist and was sold an OTC test (Ellume) that she could take and result at home. Patient reports that she purchased the test and used it as advised. Results were (+), however her job will not accept this result and advised that she present to urgent care for evaluation and testing. In efforts to conservatively manage her symptoms at home, the patient notes that she has used "some sinus medicine", which has not helped to improve her symptoms.    Past Medical History:  Diagnosis  Date  . Arthritis   . Asthma   . Bilateral breast cysts   . 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 Complication of anesthesia    pt woke up during breast surgery (1995)  . COPD (chronic obstructive pulmonary disease) ([place])   . Emphysema lung ([place])   . GERD (gastroesophageal reflux disease)   . Headache    sinus  . Hemorrhoids   . Hypertension   . Irritable bowel disease   . 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 ACHILLES TENDON SURGERY Right 06/07/2019   Procedure: ACHILLES REPAIR SECONDARY RIGHT;  Surgeon: Albertine Patricia, DPM;  Location: [facility];  Service: Podiatry;  Laterality: Right;  general with local  . APPENDECTOMY    . BREAST BIOPSY Right 01-01-15   BIPHASIC FIBROEPITHELIAL LESION with ADH  . BREAST LUMPECTOMY Right 04/25/2015   Procedure: RIGHT BREAST LUMPECTOMY, exicision of skin tag right face;  Surgeon: Christene Lye, MD;  Location: [facility];  Service: General;  Laterality: Right;  . BREAST MAMMOSITE Right 05-21-15  . BREAST SURGERY Left    cyst removal  . CALCANEAL OSTEOTOMY Right 06/07/2019   Procedure: PARTIAL CALCANECTOMY;  Surgeon: Albertine Patricia, DPM;  Location: [facility];  Service: Podiatry;  Laterality: Right;  . COLONOSCOPY WITH PROPOFOL N/A 07/07/2015   Procedure: COLONOSCOPY WITH PROPOFOL;  Surgeon: Josefine Class, MD;  Location: [facility] ENDOSCOPY;  Service: Endoscopy;  Laterality: N/A;  . COLONOSCOPY WITH  PROPOFOL N/A 06/12/2018   Procedure: COLONOSCOPY WITH PROPOFOL;  Surgeon: Lin Landsman, MD;  Location[facility] ENDOSCOPY;  Service: Gastroenterology;  Laterality: N/A;  . cyst lower spine    . ESOPHAGOGASTRODUODENOSCOPY (EGD) WITH PROPOFOL N/A 07/07/2015   Procedure: ESOPHAGOGASTRODUODENOSCOPY (EGD) WITH PROPOFOL;  Surgeon: Josefine Class, MD;  Location[facility];  Service: Endoscopy;   Laterality: N/A;  . ESOPHAGOGASTRODUODENOSCOPY (EGD) WITH PROPOFOL N/A 06/12/2018   Procedure: ESOPHAGOGASTRODUODENOSCOPY (EGD) WITH PROPOFOL;  Surgeon: Lin Landsman, MD;  Location: [facility] END[facility]COPY;  Service: Gastroenterology;  Laterality: N/A;  . KNEE SURGERY    . piondial cyst excision  03/1989    Family History  Problem Relation Age of Onset  . Colon cancer Father        age 64  . Anemia Father   . Diabetes Father   . Hypertension Father   . Diverticulosis Mother   . COPD Mother   . Hypertension Brother   . Alzheimer's disease Paternal Grandfather     Social History   Tobacco Use  . Smoking status: Former Smoker    Packs/day: 0.25    Years: 44.00    Pack years: 11.00    Types: Cigarettes  . Smokeless tobacco: Never Used  . Tobacco comment: pt states she stopped smoking about 3 weeks ago  Substance Use Topics  . Alcohol use: No    Alcohol/week: 0.0 standard drinks  . Drug use: No    Patient Active Problem List   Diagnosis Date Noted  . Hyperlipidemia 11/12/2019  . Depression 11/12/2019  . Acute ischemic stroke ([place]) 11/03/2019  . Right arm weakness 11/02/2019  . Pap smear abnormality of cervix/human papillomavirus (HPV) positive 07/03/2018  . Class 1 obesity due to excess calories with serious comorbidity and body mass index (BMI) of 33.0 to 33.9 in adult 04/20/2018  . 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 Tobacco abuse 11/24/2015  . Heel pain, bilateral 10/24/2015  . Arthritis   . COPD (chronic obstructive pulmonary disease) ([place])   . GERD (gastroesophageal reflux disease)   . Upper GI bleed 07/05/2015  . History of breast cancer 04/25/2015    Home Medications:    Current Meds  Medication Sig  . albuterol (PROAIR HFA) 108 (90 Base) MCG/ACT inhaler Inhale 1-2 puffs into the lungs every 4 (four) hours as needed for wheezing or shortness of breath.  Marland Kitchen amLODipine (NORVASC) 5 MG tablet Take 1 tablet (5 mg total) by mouth daily.  Marland Kitchen atorvastatin  (LIPITOR) 40 MG tablet Take 1 tablet (40 mg total) by mouth daily at 6 PM.  . budesonide-formoterol (SYMBICORT) 160-4.5 MCG/ACT inhaler Inhale 2 puffs into the lungs 2 (two) times daily.  Marland Kitchen buPROPion (WELLBUTRIN XL) 300 MG 24 hr tablet Take 1 tablet (300 mg total) by mouth daily.  . celecoxib (CELEBREX) 200 MG capsule TAKE 1 CAPSULE BY MOUTH EVERY DAY  . Cholecalciferol (VITAMIN D3) 1000 units CAPS Take 1,000 Units by mouth daily.  . clopidogrel (PLAVIX) 75 MG tablet Take 75 mg by mouth daily.   Marland Kitchen omeprazole (PRILOSEC) 40 MG capsule Take 1 capsule (40 mg total) by mouth daily.  . ondansetron (ZOFRAN ODT) 4 MG disintegrating tablet Take 1 tablet (4 mg total) by mouth every 8 (eight) hours as needed for nausea or vomiting.  Marland Kitchen Spacer/Aero-Holding Chambers DEVI 1 each by Does not apply route daily.  Marland Kitchen telmisartan (MICARDIS) 80 MG tablet Take 1 tablet (80 mg total) by mouth daily.  . Tiotropium Bromide Monohydrate (SPIRIVA RESPIMAT) 2.5  MCG/ACT AERS Inhale 1 puff into the lungs daily.    Allergies:   Acetaminophen, Aspirin, Oxycodone-acetaminophen, and Tramadol  Review of Systems (ROS):  Review of systems NEGATIVE unless otherwise noted in narrative H&P section.   Vital Signs: Today's Vitals   01/19/20 1051 01/19/20 1052 01/19/20 1054 01/19/20 1134  BP:   (!) 118/99   Pulse:   (!) 101   Resp:   19   Temp:   98.4 F (36.9 C)   TempSrc:   Oral   SpO2:   97%   Weight:  190 lb (86.2 kg)    Height:  '5\' 5"'  (1.651 m)    PainSc: 7    7     Physical Exam: Physical Exam  Constitutional: She is oriented to person, place, and time and well-developed, well-nourished, and in no distress.  Acutely ill appearing; fatigued.  HENT:  Head: Normocephalic and atraumatic.  Right Ear: Tympanic membrane normal.  Left Ear: Tympanic membrane normal.  Nose: Mucosal edema and rhinorrhea present. No sinus tenderness.  Mouth/Throat: Uvula is midline and mucous membranes are normal. Posterior oropharyngeal  erythema present. No oropharyngeal exudate or posterior oropharyngeal edema.  Eyes: Pupils are equal, round, and reactive to light.  Cardiovascular: Regular rhythm, normal heart sounds and intact distal pulses. Tachycardia present.  Pulmonary/Chest: Effort normal and breath sounds normal.  Neurological: She is alert and oriented to person, place, and time. Gait normal.  Skin: Skin is warm and dry. No rash noted. She is not diaphoretic.  Psychiatric: Mood, memory, affect and judgment normal.  Nursing note and vitals reviewed.   Urgent Care Treatments / Results:   Orders Placed This Encounter  Procedures  . SARS Coronavirus 2 Ag (30 min TAT) - Nasal Swab (BD Veritor Kit)  . Airborne and Contact precautions    LABS: PLEASE NOTE: all labs that were ordered this encounter are listed, however only abnormal results are displayed. Labs Reviewed  SARS CORONAVIRUS 2 AG (30 MIN TAT) - Abnormal; Notable for the following components:      Result Value   SARS Coronavirus 2 Ag POSITIVE (*)    All other components within normal limits    EKG: -None  RADIOLOGY: No results found.  PROCEDURES: Procedures  MEDICATIONS RECEIVED THIS VISIT: Medications - No data to display  PERTINENT CLINICAL COURSE NOTES/UPDATES:   Initial Impression / Assessment and Plan / Urgent Care Course:  Pertinent labs & imaging results that were available during my care of the patient were personally reviewed by me and considered in my medical decision making (see lab/imaging section of note for values and interpretations).  Meagan York is a 64 y.o. female who presents to [facility] today with complaints of Cough and Nasal Congestion  Patient overall well appearing and in no acute distress today in clinic. Presenting symptoms (see HPI) and exam as documented above. She presents with symptoms associated with SARS-CoV-2 (novel coronavirus). Discussed typical symptom constellation. Reviewed potential for  infection and need for testing. Patient amenable to being tested. Rapid SARS-CoV-2 Ag swab collected by certified clinical staff; results POSITIVE. Discussed symptomatic care at home; rest, increased hydration, and PRN use of APAP/IBU. Treating with 5 days steroid burst (40 mg daily). Cough is significant and preventing sleep. Will send in a supply of Tussionex for PRN use. She was educated on the indications and associated side effects of this medication. Patient has multiple co-morbidities that increase her SARS-CoV-2 morbidity and mortality risk including asthma, COPD, history of cancer,  past CVA, and  Body mass index is 31.62 kg/m.  Patient referral made to infusion clinic; co-morbidities should qualify her for treatment with the monoclonal antibody (bamlanivimab) infusion. Outpatient COVID response team to determine eligibility and contact the patient to discuss if she is deemed to be eligible for the infusion treatment.  Current clinical condition warrants patient being out of work in order to quarantine following (+) testing results. She was provided with the appropriate documentation to provide to her place of employment documenting testing date and need to remain on quarantine until she has met the Centers for Disease Control and Prevention (CDC) "Criteria for Ending Self-Isolation" which includes all of the following: . at least 10 days since symptoms onset . AND 3 days fever free without antipyretics (Tylenol or Ibuprofen) . AND improvement in respiratory symptoms. These measures are being implemented out of an abundance of caution to prevent transmission and spread during the current SARS-CoV-2 pandemic.  Discussed follow up with primary care physician in 1 week for re-evaluation. I have reviewed the follow up and strict return precautions for any new or worsening symptoms. Patient is aware of symptoms that would be deemed urgent/emergent, and would thus require further evaluation either here or  in the emergency department. At the time of discharge, she verbalized understanding and consent with the discharge plan as it was reviewed with her. All questions were fielded by provider and/or clinic staff prior to patient discharge.    Final Clinical Impressions / Urgent Care Diagnoses:   Final diagnoses:  [id number]    New Prescriptions:  [place] Controlled Substance Registry consulted? Yes, I have consulted the [place] Controlled Substances Registry for this patient, and feel the risk/benefit ratio today is favorable for proceeding with this prescription for a controlled substance.  . Discussed use of controlled substance medication to treat her acute symptoms.  o Reviewed [place] STOP Act regulations  o Clinic does not refill controlled substances over the phone without face to face evaluation.  . Safety precautions reviewed.  o Medications should not be sold or taken with alcohol.  o Avoid use while working, driving, or operating heavy machinery.  o Side effects associated with the use of this particular medication reviewed. - Patient understands that this medication can cause CNS depression, increase her risk of falls, and even lead to overdose that may result in death, if used outside of the parameters that she and I discussed.  With all of this in mind, she knowingly accepts the risks and responsibilities associated with intended course of treatment, and elects to responsibly proceed as discussed.  Meds ordered this encounter  Medications  . chlorpheniramine-HYDROcodone (TUSSIONEX PENNKINETIC ER) 10-8 MG/5ML SUER    Sig: Take 5 mLs by mouth every 12 (twelve) hours as needed for cough. Will cause drowsiness; NO DRIVING.    Dispense:  70 mL    Refill:  0  . predniSONE (DELTASONE) 20 MG tablet    Sig: Take 2 tablets (40 mg total) by mouth daily. TAKE WITH FOOD    Dispense:  10 tablet    Refill:  0    Recommended Follow up Care:  Patient encouraged to follow up with the following provider  within the specified time frame, or sooner as dictated by the severity of her symptoms. As always, she was instructed that for any urgent/emergent care needs, she should seek care either here or in the emergency department for more immediate evaluation.  Follow-up Information    Valerie Roys, DO In  1 week.   Specialty: Family Medicine Why: General reassessment of symptoms if not improving Contact information: [place] [address] [phone]         NOTE: This note was prepared using Dragon dictation software along with smaller phrase technology. Despite my best ability to proofread, there is the potential that transcriptional errors may still occur from this process, and are completely unintentional.    Karen Kitchens, NP 01/19/20 1251

## 2020-01-19 NOTE — Telephone Encounter (Signed)
Called to discuss with patient about Covid symptoms and the use of bamlanivimab/etesevimab or casirivimab/imdevimab, a monoclonal antibody infusion for those with mild to moderate Covid symptoms and at a high risk of hospitalization.  Pt is qualified for this infusion at the [facility] infusion center due to Age >55 with hx of CVA,  COPD and breast cancer who received chemo. She would like to go to [place]. I have discussed case with the [facility] infusion center and they will reach out to her.   Angelena Form PA-C  [facility]

## 2020-01-19 NOTE — ED Triage Notes (Signed)
Patient states that she was tested yesterday for Covid at [facility] and was positive yesterday. Patient states that she was told by her employer that she must see a urgent care and be diagnosed with Covid. Patient states that her symptoms started 2-3 days ago. Patient reports that her employer was upset when she requested to leave work. Patient reports that she has COPD.

## 2020-01-19 NOTE — Discharge Instructions (Addendum)
It was very nice seeing you today in clinic. Thank you for entrusting me with your care.   Please utilize the medications that we discussed. Your prescriptions has been called in to your pharmacy.   I am going to refer you to the infusion clinic for consideration of treatment using the monoclonal antibody (bamlanivimab) infusion. Someone will call you to discuss.   Make arrangements to follow up with your regular doctor in 1 week for re-evaluation if not improving. If your symptoms/condition worsens, please seek follow up care either here or in the ER. Please remember, our [place] providers are "right here with you" when you need [place].   Again, it was my pleasure to take care of you today. Thank you for choosing our clinic. I hope that you start to feel better quickly.   Honor Loh, MSN, APRN, FNP-C, CEN Advanced Practice Provider [facility]

## 2020-02-06 ENCOUNTER — Telehealth: Payer: Self-pay | Admitting: Family Medicine

## 2020-02-06 NOTE — Telephone Encounter (Signed)
Patient requesting refill of "smoking patches". Patient did not know the name of the medication.      Pharmacy:  [id number] - [place], [address] Phone:  [phone]  Fax:  [phone]

## 2020-02-06 NOTE — Telephone Encounter (Signed)
Patient requesting new Rx - Nicoderm patch. Not current on medication list- will need new Rx

## 2020-02-06 NOTE — Telephone Encounter (Signed)
Routing to provider. Does patient need an appointment first?

## 2020-02-07 NOTE — Telephone Encounter (Signed)
Pt following up on Rx for Nicoderm patch.  Would like sent to  [id number] - [place], [place] - [address] Phone:  [phone]  Fax:  [phone]

## 2020-02-08 MED ORDER — NICOTINE 7 MG/24HR TD PT24
7.0000 mg | MEDICATED_PATCH | Freq: Every day | TRANSDERMAL | 0 refills | Status: DC
Start: 2020-02-08 — End: 2020-07-25

## 2020-02-08 NOTE — Telephone Encounter (Signed)
Can you send in patches please

## 2020-02-08 NOTE — Telephone Encounter (Signed)
Pt called backs and says she smokes "5 cigarettes a day"

## 2020-02-08 NOTE — Telephone Encounter (Signed)
Needs appointment or at least I need to know how much she's smoking.

## 2020-03-02 ENCOUNTER — Other Ambulatory Visit: Payer: Self-pay | Admitting: Family Medicine

## 2020-03-02 NOTE — Telephone Encounter (Signed)
Requested Prescriptions  Pending Prescriptions Disp Refills  . buPROPion (WELLBUTRIN XL) 300 MG 24 hr tablet [Pharmacy Med Name: BUPROPION HCL XL 300 MG TABLET] 90 tablet 1    Sig: TAKE 1 TABLET BY MOUTH EVERY DAY     Psychiatry: Antidepressants - bupropion Failed - 03/02/2020  9:34 AM      Failed - Last BP in normal range    BP Readings from Last 1 Encounters:  01/19/20 (!) 118/99         Passed - Completed PHQ-2 or PHQ-9 in the last 360 days.      Passed - Valid encounter within last 6 months    Recent Outpatient Visits          2 months ago Upper respiratory tract infection, unspecified type   [place], [place], DO   2 months ago Reactive depression   [facility] Merrie Roof [place], [place]   3 months ago Acute ischemic stroke [facility])   [place], [place], [place]   4 months ago Right foot infection   [facility] Merrie Roof [place], [place]   9 months ago Benign hypertensive renal disease   [facility] Volney [place], [place]      Future Appointments            In 4 days Wynetta Emery, Barb Merino, DO [employer], [facility]

## 2020-03-06 ENCOUNTER — Ambulatory Visit (INDEPENDENT_AMBULATORY_CARE_PROVIDER_SITE_OTHER): Payer: 59 | Admitting: Family Medicine

## 2020-03-06 ENCOUNTER — Encounter: Payer: Self-pay | Admitting: Family Medicine

## 2020-03-06 ENCOUNTER — Other Ambulatory Visit: Payer: Self-pay

## 2020-03-06 VITALS — BP 119/84 | HR 84 | Temp 98.5°F | Wt 186.8 lb

## 2020-03-06 DIAGNOSIS — E6609 Other obesity due to excess calories: Secondary | ICD-10-CM

## 2020-03-06 DIAGNOSIS — I129 Hypertensive chronic kidney disease with stage 1 through stage 4 chronic kidney disease, or unspecified chronic kidney disease: Secondary | ICD-10-CM | POA: Diagnosis not present

## 2020-03-06 DIAGNOSIS — F329 Major depressive disorder, single episode, unspecified: Secondary | ICD-10-CM | POA: Diagnosis not present

## 2020-03-06 DIAGNOSIS — Z8673 Personal history of transient ischemic attack (TIA), and cerebral infarction without residual deficits: Secondary | ICD-10-CM

## 2020-03-06 DIAGNOSIS — R0683 Snoring: Secondary | ICD-10-CM

## 2020-03-06 DIAGNOSIS — J449 Chronic obstructive pulmonary disease, unspecified: Secondary | ICD-10-CM

## 2020-03-06 DIAGNOSIS — E66811 Obesity, class 1: Secondary | ICD-10-CM

## 2020-03-06 DIAGNOSIS — E782 Mixed hyperlipidemia: Secondary | ICD-10-CM

## 2020-03-06 DIAGNOSIS — Z6833 Body mass index (BMI) 33.0-33.9, adult: Secondary | ICD-10-CM

## 2020-03-06 LAB — MICROALBUMIN, URINE WAIVED
Creatinine, Urine Waived: 100 mg/dL (ref 10–300)
Microalb, Ur Waived: 30 mg/L — ABNORMAL HIGH (ref 0–19)
Microalb/Creat Ratio: <30 mg/g (ref <30)

## 2020-03-06 MED ORDER — CLOPIDOGREL BISULFATE 75 MG PO TABS: 75.0000 mg | ORAL_TABLET | Freq: Every day | ORAL | 3 refills | Status: DC

## 2020-03-06 MED ORDER — ATORVASTATIN CALCIUM 40 MG PO TABS: 40.0000 mg | ORAL_TABLET | Freq: Every day | ORAL | 1 refills | Status: DC

## 2020-03-06 MED ORDER — TELMISARTAN 80 MG PO TABS: 80.0000 mg | ORAL_TABLET | Freq: Every day | ORAL | 1 refills | Status: DC

## 2020-03-06 MED ORDER — ALBUTEROL SULFATE HFA 108 (90 BASE) MCG/ACT IN AERS: 1.0000 | INHALATION_SPRAY | RESPIRATORY_TRACT | 3 refills | Status: DC | PRN

## 2020-03-06 MED ORDER — TRAZODONE HCL 50 MG PO TABS: 50.0000 mg | ORAL_TABLET | Freq: Every evening | ORAL | 3 refills | Status: DC | PRN

## 2020-03-06 MED ORDER — AMLODIPINE BESYLATE 5 MG PO TABS: 5.0000 mg | ORAL_TABLET | Freq: Every day | ORAL | 1 refills | Status: DC

## 2020-03-06 MED ORDER — OMEPRAZOLE 40 MG PO CPDR: 40.0000 mg | DELAYED_RELEASE_CAPSULE | Freq: Every day | ORAL | 1 refills | Status: DC

## 2020-03-06 MED ORDER — BUPROPION HCL ER (XL) 300 MG PO TB24: 300.0000 mg | ORAL_TABLET | Freq: Every day | ORAL | 1 refills | Status: DC

## 2020-03-06 MED ORDER — BUDESONIDE-FORMOTEROL FUMARATE 160-4.5 MCG/ACT IN AERO: 2.0000 | INHALATION_SPRAY | Freq: Two times a day (BID) | RESPIRATORY_TRACT | 4 refills | Status: DC

## 2020-03-06 NOTE — Assessment & Plan Note (Signed)
Under good control on current regimen. Continue current regimen. Continue to monitor. Call with any concerns. Refills given. Labs drawn today.   

## 2020-03-06 NOTE — Assessment & Plan Note (Signed)
Will keep BP and cholesterol under good control. Continue to monitor. Call with any concerns.  

## 2020-03-06 NOTE — Assessment & Plan Note (Signed)
Congratulated patient on 11lb weight loss. Continue diet and exercise with goal of losing 1-2lbs per week. Call with any concerns. Continue to monitor.

## 2020-03-06 NOTE — Progress Notes (Signed)
BP 119/84 (BP Location: Left Arm, Patient Position: Sitting, Cuff Size: Normal)   Pulse 84   Temp 98.5 F (36.9 C) (Oral)   Wt 186 lb 12.8 oz (84.7 kg)   SpO2 98%   BMI 31.09 kg/m    Subjective:    Patient ID: Meagan York, female    DOB: 1955-09-19, 64 y.o.   MRN: [id number]  HPI: Meagan York is a 64 y.o. female  Chief Complaint  Patient presents with  . Hypertension  . Depression   HYPERTENSION / HYPERLIPIDEMIA Satisfied with current treatment? yes Duration of hypertension: chronic BP monitoring frequency: not checking BP medication side effects: no Past BP meds: amlodipine, telmisartan Duration of hyperlipidemia: chronic Cholesterol medication side effects: no Cholesterol supplements: none Past cholesterol medications: atorvastatin Medication compliance: excellent compliance Aspirin: no Recent stressors: no Recurrent headaches: no Visual changes: no Palpitations: no Dyspnea: no Chest pain: no Lower extremity edema: no Dizzy/lightheaded: no  COPD COPD status: controlled Satisfied with current treatment?: yes Oxygen use: no Dyspnea frequency: rarely Cough frequency: rarely Rescue inhaler frequency: occasionally   Limitation of activity: no Pneumovax: Up to Date Influenza: Up to Date  DEPRESSION Mood status: controlled Satisfied with current treatment?: yes Symptom severity: mild  Duration of current treatment : chronic Side effects: no Medication compliance: excellent compliance Psychotherapy/counseling: no  Previous psychiatric medications: wellbutrin Depressed mood: no Anxious mood: no Anhedonia: no Significant weight loss or gain: no Insomnia: yes hard to fall asleep Fatigue: yes Feelings of worthlessness or guilt: no Impaired concentration/indecisiveness: no Suicidal ideations: no Hopelessness: no Crying spells: no Depression screen [facility] 2/9 03/06/2020 12/06/2019 06/26/2018 04/20/2018 02/24/2017  Decreased Interest 0 1 0 3 2  Down,  Depressed, Hopeless 0 0 0 0 0  PHQ - 2 Score 0 1 0 3 2  Altered sleeping _0 -  Tired, decreased energy 0 _1 -  Change in appetite 0 0 0 1 -  Feeling bad or failure about yourself  0 0 0 1 -  Trouble concentrating 0 0 0 2 -  Moving slowly or fidgety/restless 0 0 0 0 -  Suicidal thoughts 0 0 0 0 -  PHQ-9 Score _2 -  Difficult doing work/chores Not difficult at all - - Somewhat difficult -   ???SLEEP APNEA Sleep apnea status: unknown Duration: chronic Last sleep study: none Treatments attempted:none  Wakes feeling refreshed:  yes Daytime hypersomnolence:  yes Fatigue:  yes Insomnia:  yes Good sleep hygiene:  yes Difficulty falling asleep:  yes Difficulty staying asleep:  yes Snoring bothers bed partner:  yes Observed apnea by bed partner: yes Obesity:  yes Hypertension: yes  Pulmonary hypertension:  no Coronary artery disease:  no  Relevant past medical, surgical, family and social history reviewed and updated as indicated. Interim medical history since our last visit reviewed. Allergies and medications reviewed and updated.  Review of Systems  Constitutional: Negative.   HENT: Negative.   Respiratory: Negative.   Cardiovascular: Negative.   Gastrointestinal: Negative.   Genitourinary: Negative.   Musculoskeletal: Negative.   Neurological: Negative.   Psychiatric/Behavioral: Negative.     Per HPI unless specifically indicated above     Objective:    BP 119/84 (BP Location: Left Arm, Patient Position: Sitting, Cuff Size: Normal)   Pulse 84   Temp 98.5 F (36.9 C) (Oral)   Wt 186 lb 12.8 oz (84.7 kg)   SpO2 98%   BMI 31.09 kg/m   Wt  Readings from Last 3 Encounters:  03/06/20 186 lb 12.8 oz (84.7 kg)  01/19/20 190 lb (86.2 kg)  12/28/19 197 lb (89.4 kg)    Physical Exam Vitals and nursing note reviewed.  Constitutional:      General: She is not in acute distress.    Appearance: Normal appearance. She is not ill-appearing, toxic-appearing or  diaphoretic.  HENT:     Head: Normocephalic and atraumatic.     Right Ear: External ear normal.     Left Ear: External ear normal.     Nose: Nose normal.     Mouth/Throat:     Mouth: Mucous membranes are moist.     Pharynx: Oropharynx is clear.  Eyes:     General: No scleral icterus.       Right eye: No discharge.        Left eye: No discharge.     Extraocular Movements: Extraocular movements intact.     Conjunctiva/sclera: Conjunctivae normal.     Pupils: Pupils are equal, round, and reactive to light.  Cardiovascular:     Rate and Rhythm: Normal rate and regular rhythm.     Pulses: Normal pulses.     Heart sounds: Normal heart sounds. No murmur heard.  No friction rub. No gallop.   Pulmonary:     Effort: Pulmonary effort is normal. No respiratory distress.     Breath sounds: Normal breath sounds. No stridor. No wheezing, rhonchi or rales.  Chest:     Chest wall: No tenderness.  Musculoskeletal:        General: Normal range of motion.     Cervical back: Normal range of motion and neck supple.  Skin:    General: Skin is warm and dry.     Capillary Refill: Capillary refill takes less than 2 seconds.     Coloration: Skin is not jaundiced or pale.     Findings: No bruising, erythema, lesion or rash.  Neurological:     General: No focal deficit present.     Mental Status: She is alert and oriented to person, place, and time. Mental status is at baseline.  Psychiatric:        Mood and Affect: Mood normal.        Behavior: Behavior normal.        Thought Content: Thought content normal.        Judgment: Judgment normal.     Results for orders placed or performed during the hospital encounter of 01/19/20  SARS Coronavirus 2 Ag (30 min TAT) - Nasal Swab (BD Veritor Kit)   Specimen: Nasal Swab (BD Veritor Kit)  Result Value Ref Range   SARS Coronavirus 2 Ag POSITIVE (A) NEGATIVE      Assessment & Plan:   Problem List Items Addressed This Visit      Respiratory   COPD  (chronic obstructive pulmonary disease) ([place])    Under good control on current regimen. Continue current regimen. Continue to monitor. Call with any concerns. Refills given. Labs drawn today.       Relevant Medications   budesonide-formoterol (SYMBICORT) 160-4.5 MCG/ACT inhaler   albuterol (PROAIR HFA) 108 (90 Base) MCG/ACT inhaler   Other Relevant Orders   Comprehensive metabolic panel   CBC with Differential/Platelet     Genitourinary   Benign hypertensive renal disease - Primary    Under good control on current regimen. Continue current regimen. Continue to monitor. Call with any concerns. Refills given. Labs drawn today.  Relevant Orders   Comprehensive metabolic panel   CBC with Differential/Platelet   Microalbumin, Urine Waived     Other   Class 1 obesity due to excess calories with serious comorbidity and body mass index (BMI) of 33.0 to 33.9 in adult    Congratulated patient on 11lb weight loss. Continue diet and exercise with goal of losing 1-2lbs per week. Call with any concerns. Continue to monitor.       Hyperlipidemia    Under good control on current regimen. Continue current regimen. Continue to monitor. Call with any concerns. Refills given. Labs drawn today.       Relevant Medications   telmisartan (MICARDIS) 80 MG tablet   atorvastatin (LIPITOR) 40 MG tablet   amLODipine (NORVASC) 5 MG tablet   Other Relevant Orders   Comprehensive metabolic panel   CBC with Differential/Platelet   Lipid Panel w/o Chol/HDL Ratio   Depression    Under good control on current regimen. Continue current regimen. Continue to monitor. Call with any concerns. Refills given. Labs drawn today.       Relevant Medications   traZODone (DESYREL) 50 MG tablet   buPROPion (WELLBUTRIN XL) 300 MG 24 hr tablet   Other Relevant Orders   Comprehensive metabolic panel   CBC with Differential/Platelet   History of stroke    Will keep BP and cholesterol under good control. Continue  to monitor. Call with any concerns.       Relevant Orders   Comprehensive metabolic panel   CBC with Differential/Platelet    Other Visit Diagnoses    Snoring       Concern for OSA. Will obtain sleep study. Await results. Treat as needed.    Relevant Orders   Ambulatory referral to Sleep Studies       Follow up plan: Return in about 6 months (around 09/05/2020) for Physical.

## 2020-03-07 LAB — COMPREHENSIVE METABOLIC PANEL
ALT: 14 IU/L (ref 0–32)
AST: 16 IU/L (ref 0–40)
Albumin/Globulin Ratio: 1.6 (ref 1.2–2.2)
Albumin: 4.1 g/dL (ref 3.8–4.8)
Alkaline Phosphatase: 118 IU/L (ref 48–121)
BUN/Creatinine Ratio: 11 — ABNORMAL LOW (ref 12–28)
BUN: 7 mg/dL — ABNORMAL LOW (ref 8–27)
Bilirubin Total: 0.5 mg/dL (ref 0.0–1.2)
CO2: 26 mmol/L (ref 20–29)
Calcium: 9.4 mg/dL (ref 8.7–10.3)
Chloride: 101 mmol/L (ref 96–106)
Creatinine, Ser: 0.65 mg/dL (ref 0.57–1.00)
GFR calc Af Amer: 110 mL/min/{1.73_m2} (ref >59)
GFR calc non Af Amer: 95 mL/min/{1.73_m2} (ref >59)
Globulin, Total: 2.5 g/dL (ref 1.5–4.5)
Glucose: 80 mg/dL (ref 65–99)
Potassium: 4.2 mmol/L (ref 3.5–5.2)
Sodium: 138 mmol/L (ref 134–144)
Total Protein: 6.6 g/dL (ref 6.0–8.5)

## 2020-03-07 LAB — CBC WITH DIFFERENTIAL/PLATELET
Basophils Absolute: 0.1 10*3/uL (ref 0.0–0.2)
Basos: 1 %
EOS (ABSOLUTE): 0.2 10*3/uL (ref 0.0–0.4)
Eos: 3 %
Hematocrit: 38.4 % (ref 34.0–46.6)
Hemoglobin: 12.5 g/dL (ref 11.1–15.9)
Immature Grans (Abs): 0 10*3/uL (ref 0.0–0.1)
Immature Granulocytes: 0 %
Lymphocytes Absolute: 1.4 10*3/uL (ref 0.7–3.1)
Lymphs: 24 %
MCH: 30.3 pg (ref 26.6–33.0)
MCHC: 32.6 g/dL (ref 31.5–35.7)
MCV: 93 fL (ref 79–97)
Monocytes Absolute: 0.6 10*3/uL (ref 0.1–0.9)
Monocytes: 10 %
Neutrophils Absolute: 3.7 10*3/uL (ref 1.4–7.0)
Neutrophils: 62 %
Platelets: 253 10*3/uL (ref 150–450)
RBC: 4.12 x10E6/uL (ref 3.77–5.28)
RDW: 12.7 % (ref 11.7–15.4)
WBC: 5.9 10*3/uL (ref 3.4–10.8)

## 2020-03-07 LAB — LIPID PANEL W/O CHOL/HDL RATIO
Cholesterol, Total: 130 mg/dL (ref 100–199)
HDL: 61 mg/dL (ref >39)
LDL Chol Calc (NIH): 55 mg/dL (ref 0–99)
Triglycerides: 67 mg/dL (ref 0–149)
VLDL Cholesterol Cal: 14 mg/dL (ref 5–40)

## 2020-05-28 ENCOUNTER — Other Ambulatory Visit: Payer: Self-pay | Admitting: Radiology

## 2020-05-28 ENCOUNTER — Other Ambulatory Visit: Payer: 59

## 2020-05-28 DIAGNOSIS — Z20822 Contact with and (suspected) exposure to covid-19: Secondary | ICD-10-CM

## 2020-05-29 LAB — NOVEL CORONAVIRUS, NAA: SARS-CoV-2, NAA: NOT DETECTED

## 2020-05-29 LAB — SARS-COV-2, NAA 2 DAY TAT

## 2020-06-05 ENCOUNTER — Telehealth: Payer: 59 | Admitting: Family Medicine

## 2020-06-11 ENCOUNTER — Other Ambulatory Visit: Payer: Self-pay

## 2020-06-11 NOTE — Telephone Encounter (Signed)
ERROR

## 2020-06-27 ENCOUNTER — Telehealth: Payer: Self-pay

## 2020-06-27 DIAGNOSIS — R0683 Snoring: Secondary | ICD-10-CM

## 2020-06-27 NOTE — Telephone Encounter (Signed)
Received fax from [facility] stating that patient declined a sleep strudy through them and would like to do a home sleep study instead due to working 2nd shift. Please enter a new order for a home sleep study per patient request.

## 2020-06-30 NOTE — Telephone Encounter (Signed)
New order placed

## 2020-07-08 ENCOUNTER — Telehealth: Payer: Self-pay

## 2020-07-08 NOTE — Telephone Encounter (Signed)
Called pt to schedule, no answer, left vm  Copied from [place] [phone]. Topic: General - Inquiry >> Jul 08, 2020  4:45 PM Gillis Ends D wrote: Reason for CRM: Patient called and wants to be seen as soon as possible for back pain. She would like a call back if she can be worked in. She can be reached at [phone]. Please advise

## 2020-07-10 ENCOUNTER — Other Ambulatory Visit: Payer: Self-pay

## 2020-07-10 ENCOUNTER — Encounter: Payer: Self-pay | Admitting: Unknown Physician Specialty

## 2020-07-10 ENCOUNTER — Ambulatory Visit (INDEPENDENT_AMBULATORY_CARE_PROVIDER_SITE_OTHER): Payer: 59 | Admitting: Unknown Physician Specialty

## 2020-07-10 VITALS — BP 131/87 | HR 92 | Temp 98.2°F | Resp 16 | Wt 186.4 lb

## 2020-07-10 DIAGNOSIS — M545 Low back pain, unspecified: Secondary | ICD-10-CM

## 2020-07-10 DIAGNOSIS — R002 Palpitations: Secondary | ICD-10-CM

## 2020-07-10 MED ORDER — METHYLPREDNISOLONE 4 MG PO TBPK
ORAL_TABLET | ORAL | 0 refills | Status: DC
Start: 2020-07-10 — End: 2020-07-25

## 2020-07-10 MED ORDER — CYCLOBENZAPRINE HCL 10 MG PO TABS
10.0000 mg | ORAL_TABLET | Freq: Three times a day (TID) | ORAL | 0 refills | Status: DC | PRN
Start: 2020-07-10 — End: 2020-07-25

## 2020-07-10 NOTE — Progress Notes (Signed)
BP 131/87   Pulse 92   Temp 98.2 F (36.8 C) (Oral)   Resp 16   Wt 186 lb 6.4 oz (84.6 kg)   SpO2 96%   BMI 31.02 kg/m    Subjective:    Patient ID: Meagan York, female    DOB: [birth year], 64 y.o.   MRN: [id number]  HPI: Meagan York is a 64 y.o. female  Chief Complaint  Patient presents with  . Back Pain    Patient comes in office today with complaints of lumbar pain. Patient states that symptoms began 6 days ago and states that pain has been staying the same. Patient descibes pain as achy and reports stiffness in back throughout the day. Patient states that she has tried patches and Tylenol otc for pain relief and has had no improvement.     Depression & Anxiety Verbalizes she has been under a tremendous amount of stress. Had right foot achilles tendon surgery towards end of the year, last year, it became infected at the beginning of this year. She states she had 3 CVAs after the foot infection as well as Covid this year. She has 6 people living with her and only one currently working thus  limited income resources.   Depression screen [facility] 2/9 07/10/2020 03/06/2020 12/06/2019 06/26/2018 04/20/2018  Decreased Interest 3 0 1 0 3  Down, Depressed, Hopeless 3 0 0 0 0  PHQ - 2 Score 6 0 1 0 3  Altered sleeping 3 2 1 3 3   Tired, decreased energy 3 0 1 3 3   Change in appetite 2 0 0 0 1  Feeling bad or failure about yourself  3 0 0 0 1  Trouble concentrating 3 0 0 0 2  Moving slowly or fidgety/restless 2 0 0 0 0  Suicidal thoughts 0 0 0 0 0  PHQ-9 Score 22 2 3 6 13   Difficult doing work/chores Extremely dIfficult Not difficult at all - - Somewhat difficult   GAD 7 : Generalized Anxiety Score 07/10/2020  Nervous, Anxious, on Edge 3  Control/stop worrying 3  Worry too much - different things 3  Trouble relaxing 3  Restless 0  Easily annoyed or irritable 3  Afraid - awful might happen 3  Total GAD 7 Score 18  Anxiety Difficulty Very difficult     Relevant past medical,  surgical, family and social history reviewed and updated as indicated. Interim medical history since our last visit reviewed. Allergies and medications reviewed and updated.  Review of Systems  Constitutional: Positive for fatigue.  Eyes: Negative.   Cardiovascular: Positive for chest pain.  Gastrointestinal: Positive for nausea.  Genitourinary: Negative.   Musculoskeletal: Positive for back pain.  Skin: Negative.   Allergic/Immunologic: Negative.   Neurological: Positive for headaches.  Hematological: Negative.   Psychiatric/Behavioral: Positive for agitation and dysphoric mood.    Per HPI unless specifically indicated above     Objective:    BP 131/87   Pulse 92   Temp 98.2 F (36.8 C) (Oral)   Resp 16   Wt 186 lb 6.4 oz (84.6 kg)   SpO2 96%   BMI 31.02 kg/m   Wt Readings from Last 3 Encounters:  07/10/20 186 lb 6.4 oz (84.6 kg)  03/06/20 186 lb 12.8 oz (84.7 kg)  01/19/20 190 lb (86.2 kg)    Physical Exam Vitals reviewed.  Constitutional:      Appearance: Normal appearance. She is obese.  HENT:     Head: Normocephalic.  Cardiovascular:     Pulses: Normal pulses.     Comments: Regular to irregular heart sounds Pulmonary:     Effort: Pulmonary effort is normal.     Comments: Diminished A&P Abdominal:     Palpations: Abdomen is soft.  Musculoskeletal:     Cervical back: Normal and normal range of motion.     Thoracic back: Normal.     Lumbar back: Tenderness present. Decreased range of motion (with flexion and hip rotation). Negative right straight leg raise test and negative left straight leg raise test.  Skin:    General: Skin is warm and dry.  Neurological:     Mental Status: She is alert.  Psychiatric:        Attention and Perception: Attention and perception normal.        Mood and Affect: Mood is depressed (psychosocial issues).        Speech: Speech normal.        Behavior: Behavior is agitated (in general as she is very stressed). Behavior is  cooperative.        Thought Content: Thought content normal. Thought content does not include homicidal or suicidal ideation.        Cognition and Memory: Cognition and memory normal.        Judgment: Judgment normal.     Comments: States currently very stressed and has been agitated and irritable.        Assessment & Plan:   Problem List Items Addressed This Visit    None    Visit Diagnoses    Palpitations    -  Primary   Suspect related to anxiety.  Will consider adding an SSRI such as Citalopram.  For now, pt feels if pain is better, her depression and anxeity with improve   Relevant Orders   EKG 12-Lead (Completed)   Referral to Chronic Care Management Services   Acute midline low back pain without sciatica       Lumbar tenderness & pain. Encouraged applying heat & chair yoga. Flexeril for night time uase and Medrol dose pack ordered.   Relevant Medications   methylPREDNISolone (MEDROL DOSEPAK) 4 MG TBPK tablet   cyclobenzaprine (FLEXERIL) 10 MG tablet   Other Relevant Orders   Referral to Chronic Care Management Services      Depression PHQ9 today is 22. Taking Wellbutrin without difficulty. Chronic Care Management Services referral added to assist with psychosocial issues. RTC in 2 weeks if no improvement may suggest SSRI.    Follow up plan: Return in about 2 weeks (around 07/24/2020).

## 2020-07-10 NOTE — Patient Instructions (Signed)
Back pain Flexeril to take at night You tube-Chair yoga for back pain  Depression I want to refer to social worker

## 2020-07-10 NOTE — Assessment & Plan Note (Addendum)
PHQ9 today is 22. Taking Wellbutrin without difficulty. Chronic Care Management Services referral added to assist with psychosocial issues. RTC in 2 weeks if no improvement may suggest SSRI.

## 2020-07-11 ENCOUNTER — Ambulatory Visit: Payer: 59 | Admitting: Family Medicine

## 2020-07-14 ENCOUNTER — Telehealth: Payer: Self-pay | Admitting: *Deleted

## 2020-07-14 NOTE — Chronic Care Management (AMB) (Signed)
  Care Management   Outreach Note  07/14/2020 Name: Meagan York MRN: [id number] DOB: [birth year]  Meagan York is a 64 y.o. year old female who is a primary care patient of Valerie Roys, DO. I reached out to [name] by phone today in response to a referral sent by Ms. Phylis Bougie [place]'s PCP, Wynetta Emery, Megan P, DO.     An unsuccessful telephone outreach was attempted today. The patient was referred to the case management team for assistance with care management and care coordination.   Follow Up Plan: A HIPAA compliant phone message was left for the patient providing contact information and requesting a return call. The care management team will reach out to the patient again over the next 7 days. If patient returns call to provider office, please advise to call [place] at [phone].  [facility]

## 2020-07-21 NOTE — Chronic Care Management (AMB) (Signed)
°  Care Management   Note  07/21/2020 Name: Meagan York MRN: [id number] DOB: Jul 24, 1956  Meagan York is a 64 y.o. year old female who is a primary care patient of Valerie Roys, DO. I reached out to [name] by phone today in response to a referral sent by [name]'s health plan.    [name] was given information about care management services today including:  1. Care management services include personalized support from designated clinical staff supervised by her physician, including individualized plan of care and coordination with other care providers 2. 24/7 contact phone numbers for assistance for urgent and routine care needs. 3. The patient may stop care management services at any time by phone call to the office staff.  Patient agreed to services and verbal consent obtained.   Follow up plan: Telephone appointment with care management team member scheduled for:08/15/2020  [facility]  Direct Dial: [phone]

## 2020-07-25 ENCOUNTER — Other Ambulatory Visit: Payer: Self-pay

## 2020-07-25 ENCOUNTER — Encounter: Payer: Self-pay | Admitting: Family Medicine

## 2020-07-25 ENCOUNTER — Ambulatory Visit (INDEPENDENT_AMBULATORY_CARE_PROVIDER_SITE_OTHER): Payer: 59 | Admitting: Family Medicine

## 2020-07-25 VITALS — BP 117/76 | HR 87 | Temp 98.1°F | Ht 64.0 in | Wt 190.0 lb

## 2020-07-25 DIAGNOSIS — R202 Paresthesia of skin: Secondary | ICD-10-CM

## 2020-07-25 DIAGNOSIS — R4789 Other speech disturbances: Secondary | ICD-10-CM

## 2020-07-25 DIAGNOSIS — R5382 Chronic fatigue, unspecified: Secondary | ICD-10-CM

## 2020-07-25 DIAGNOSIS — F331 Major depressive disorder, recurrent, moderate: Secondary | ICD-10-CM

## 2020-07-25 DIAGNOSIS — Z13220 Encounter for screening for lipoid disorders: Secondary | ICD-10-CM

## 2020-07-25 DIAGNOSIS — G5601 Carpal tunnel syndrome, right upper limb: Secondary | ICD-10-CM

## 2020-07-25 DIAGNOSIS — M545 Low back pain, unspecified: Secondary | ICD-10-CM | POA: Diagnosis not present

## 2020-07-25 LAB — URINALYSIS, ROUTINE W REFLEX MICROSCOPIC
Bilirubin, UA: NEGATIVE
Glucose, UA: NEGATIVE
Ketones, UA: NEGATIVE
Leukocytes,UA: NEGATIVE
Nitrite, UA: NEGATIVE
Protein,UA: NEGATIVE
Specific Gravity, UA: 1.015 (ref 1.005–1.030)
Urobilinogen, Ur: 0.2 mg/dL (ref 0.2–1.0)
pH, UA: 5.5 (ref 5.0–7.5)

## 2020-07-25 LAB — MICROSCOPIC EXAMINATION
Bacteria, UA: NONE SEEN
WBC, UA: NONE SEEN /hpf (ref 0–5)

## 2020-07-25 LAB — BAYER DCA HB A1C WAIVED: HB A1C (BAYER DCA - WAIVED): 5.2 % (ref <7.0)

## 2020-07-25 MED ORDER — CELECOXIB 100 MG PO CAPS
100.0000 mg | ORAL_CAPSULE | Freq: Two times a day (BID) | ORAL | 3 refills | Status: AC
Start: 2020-07-25 — End: ?

## 2020-07-25 MED ORDER — DULOXETINE HCL 20 MG PO CPEP
20.0000 mg | ORAL_CAPSULE | Freq: Every day | ORAL | 3 refills | Status: DC
Start: 2020-07-25 — End: 2020-08-05

## 2020-07-25 NOTE — Patient Instructions (Signed)

## 2020-07-25 NOTE — Progress Notes (Signed)
BP 117/76   Pulse 87   Temp 98.1 F (36.7 C) (Oral)   Ht 5\' 4"  (1.626 m)   Wt 190 lb (86.2 kg)   SpO2 98%   BMI 32.61 kg/m    Subjective:    Patient ID: Meagan York, female    DOB: 10-16-1955, 64 y.o.   MRN: [id number]  HPI: Meagan York is a 64 y.o. female  Chief Complaint  Patient presents with  . Back Pain    getting worse past 3 weeks  . Tingling    b/l hands  . cant think of words    having a hard time thinking of words for common items like pencils  . feeling off    Wants to be checked for a lack of B-12 and Diabetes   BACK PAIN Duration: months, worse in the last 3 weeks Mechanism of injury: unknown Location: bilateral and low back Onset: gradual Severity: 6/10 , gets up to about 9/10 Quality: snapping, tight pain Frequency: constant Radiation: R leg above the knee and L leg above the knee Aggravating factors: standing Alleviating factors: sitting Status: worse Treatments attempted: rest, ice, heat and APAP  Relief with NSAIDs?: No NSAIDs Taken Nighttime pain:  yes Paresthesias / decreased sensation:  no Bowel / bladder incontinence:  no Fevers:  no Dysuria / urinary frequency:  no  Has been having issues with word finding. Has been getting confused. She has no confusion about what's going on. She has not gotten lost, she has not been having issues knowing people. She does note that she has had some unsteadiness on her feet. She has not followed up with her neurologist in a while. She's scheduled to see them in about 2 week.   ANXIETY/STRESS Duration: chronic Status:exacerbated Anxious mood: yes  Excessive worrying: yes Irritability: no  Sweating: no Nausea: no Palpitations:no Hyperventilation: no Panic attacks: no Agoraphobia: no  Obscessions/compulsions: no Depressed mood: yes Depression screen [facility] 2/9 07/10/2020 03/06/2020 12/06/2019 06/26/2018 04/20/2018  Decreased Interest 3 0 1 0 3  Down, Depressed, Hopeless 3 0 0 0 0  PHQ - 2 Score 6  0 1 0 3  Altered sleeping 3 2 1 3 3   Tired, decreased energy 3 0 1 3 3   Change in appetite 2 0 0 0 1  Feeling bad or failure about yourself  3 0 0 0 1  Trouble concentrating 3 0 0 0 2  Moving slowly or fidgety/restless 2 0 0 0 0  Suicidal thoughts 0 0 0 0 0  PHQ-9 Score 22 2 3 6 13   Difficult doing work/chores Extremely dIfficult Not difficult at all - - Somewhat difficult   Anhedonia: no Weight changes: no Insomnia: no   Hypersomnia: yes Fatigue/loss of energy: yes Feelings of worthlessness: no Feelings of guilt: no Impaired concentration/indecisiveness: no Suicidal ideations: no  Crying spells: no Recent Stressors/Life Changes: yes   Relationship problems: no   Family stress: yes     Financial stress: yes    Job stress: yes    Recent death/loss: no  NUMBNESS Duration: 1-2 months Onset: gradual Location:  R hand, sometimes left hand, whole hand Bilateral: no Symmetric: no Decreased sensation: no  Weakness: yes Pain: yes Quality:  Numb and tingling Severity: moderate  Frequency: intermittent Trauma: no Recent illness: no Diabetes: no Thyroid disease: no  HIV: no  Alcoholism: no  Spinal cord injury: no Status: worse Treatments attempted:   Relevant past medical, surgical, family and social history reviewed and  updated as indicated. Interim medical history since our last visit reviewed. Allergies and medications reviewed and updated.  Review of Systems  Per HPI unless specifically indicated above     Objective:    BP 117/76   Pulse 87   Temp 98.1 F (36.7 C) (Oral)   Ht 5\' 4"  (1.626 m)   Wt 190 lb (86.2 kg)   SpO2 98%   BMI 32.61 kg/m   Wt Readings from Last 3 Encounters:  07/25/20 190 lb (86.2 kg)  07/10/20 186 lb 6.4 oz (84.6 kg)  03/06/20 186 lb 12.8 oz (84.7 kg)    Physical Exam Vitals and nursing note reviewed.  Constitutional:      General: She is not in acute distress.    Appearance: Normal appearance. She is not ill-appearing,  toxic-appearing or diaphoretic.  HENT:     Head: Normocephalic and atraumatic.     Right Ear: External ear normal.     Left Ear: External ear normal.     Nose: Nose normal.     Mouth/Throat:     Mouth: Mucous membranes are moist.     Pharynx: Oropharynx is clear.  Eyes:     General: No scleral icterus.       Right eye: No discharge.        Left eye: No discharge.     Extraocular Movements: Extraocular movements intact.     Conjunctiva/sclera: Conjunctivae normal.     Pupils: Pupils are equal, round, and reactive to light.  Cardiovascular:     Rate and Rhythm: Normal rate and regular rhythm.     Pulses: Normal pulses.     Heart sounds: Normal heart sounds. No murmur heard.  No friction rub. No gallop.   Pulmonary:     Effort: Pulmonary effort is normal. No respiratory distress.     Breath sounds: Normal breath sounds. No stridor. No wheezing, rhonchi or rales.  Chest:     Chest wall: No tenderness.  Musculoskeletal:        General: Normal range of motion.     Cervical back: Normal range of motion and neck supple.     Comments: + tinel's to the wrist on the R and +Phalen's on the R  Skin:    General: Skin is warm and dry.     Capillary Refill: Capillary refill takes less than 2 seconds.     Coloration: Skin is not jaundiced or pale.     Findings: No bruising, erythema, lesion or rash.  Neurological:     General: No focal deficit present.     Mental Status: She is alert and oriented to person, place, and time. Mental status is at baseline.  Psychiatric:        Mood and Affect: Mood normal.        Behavior: Behavior normal.        Thought Content: Thought content normal.        Judgment: Judgment normal.     Results for orders placed or performed in visit on 05/28/20  Novel Coronavirus, NAA ([facility])   Specimen: Nasopharyngeal(NP) swabs in vial transport medium   Nasopharynge  Screenin  Result Value Ref Range   SARS-CoV-2, NAA Not Detected Not Detected  SARS-COV-2, NAA  2 DAY TAT   Nasopharynge  Screenin  Result Value Ref Range   SARS-CoV-2, NAA 2 DAY TAT Performed       Assessment & Plan:   Problem List Items Addressed This Visit  Other   Depression    Not under good control. Will continue wellbutrin and add cymbalta. Call with any concerns. Recheck 3-4 weeks.       Relevant Medications   DULoxetine (CYMBALTA) 20 MG capsule    Other Visit Diagnoses    Acute low back pain, unspecified back pain laterality, unspecified whether sciatica present    -  Primary   Will get x-ray and start celebrex and cymbalta. Recheck about 3 weeks. Call with any concerns.    Relevant Medications   celecoxib (CELEBREX) 100 MG capsule   Other Relevant Orders   CBC with Differential/Platelet   Comprehensive metabolic panel   Urinalysis, Routine w reflex microscopic   DG Lumbar Spine Complete   Chronic fatigue       Checking labs today. Await results.    Relevant Orders   Bayer DCA Hb A1c Waived   CBC with Differential/Platelet   Comprehensive metabolic panel   TSH   VITAMIN D 25 Hydroxy (Vit-D Deficiency, Fractures)   B12   Vitamin B1   Paresthesias       Checking labs today. Await results. Likely CTS- see discussion below.   Relevant Orders   Bayer DCA Hb A1c Waived   CBC with Differential/Platelet   Comprehensive metabolic panel   TSH   VITAMIN D 25 Hydroxy (Vit-D Deficiency, Fractures)   B12   Vitamin B1   Word finding difficulty       Likely due to her anxiety. Will start cymbalta. Will check labs. Follow up with neurology as scheduled.    Relevant Orders   CBC with Differential/Platelet   Comprehensive metabolic panel   TSH   VITAMIN D 25 Hydroxy (Vit-D Deficiency, Fractures)   B12   Vitamin B1   Carpal tunnel syndrome of right wrist       Will treat with cymbalta and celebrex. Offered referral to hand surgery- she would like to hold for now. Continue to monitor.    Relevant Medications   DULoxetine (CYMBALTA) 20 MG capsule   Screening  for cholesterol level       Labs drawn today. Await results.    Relevant Orders   Lipid Panel w/o Chol/HDL Ratio       Follow up plan: Return in about 3 weeks (around 08/15/2020).

## 2020-07-25 NOTE — Assessment & Plan Note (Signed)
Not under good control. Will continue wellbutrin and add cymbalta. Call with any concerns. Recheck 3-4 weeks.

## 2020-07-28 ENCOUNTER — Other Ambulatory Visit: Payer: Self-pay | Admitting: Family Medicine

## 2020-07-28 ENCOUNTER — Ambulatory Visit
Admission: RE | Admit: 2020-07-28 | Discharge: 2020-07-28 | Disposition: A | Discharge status: Home / Self Care | Payer: 59 | Source: Home / Self Care | Attending: Family Medicine | Admitting: Family Medicine

## 2020-07-28 ENCOUNTER — Ambulatory Visit
Admission: RE | Admit: 2020-07-28 | Discharge: 2020-07-28 | Disposition: A | Discharge status: Home / Self Care | Payer: 59 | Source: Ambulatory Visit | Attending: Family Medicine | Admitting: Family Medicine

## 2020-07-28 ENCOUNTER — Encounter: Payer: Self-pay | Admitting: Family Medicine

## 2020-07-28 ENCOUNTER — Other Ambulatory Visit: Payer: Self-pay

## 2020-07-28 DIAGNOSIS — E538 Deficiency of other specified B group vitamins: Secondary | ICD-10-CM

## 2020-07-28 DIAGNOSIS — M545 Low back pain, unspecified: Secondary | ICD-10-CM | POA: Diagnosis present

## 2020-07-28 MED ORDER — CYANOCOBALAMIN 1000 MCG/ML IJ SOLN: 1000.0000 ug | INTRAMUSCULAR | Status: AC

## 2020-07-28 MED ORDER — CYANOCOBALAMIN 1000 MCG/ML IJ SOLN
1000.0000 ug | INTRAMUSCULAR | Status: AC
Start: 2020-07-28 — End: 2020-08-25
  Administered 2020-07-31 – 2020-08-25 (×2): 1000 ug via INTRAMUSCULAR

## 2020-07-30 ENCOUNTER — Telehealth: Payer: Self-pay

## 2020-07-30 LAB — CBC WITH DIFFERENTIAL/PLATELET
Basophils Absolute: 0 10*3/uL (ref 0.0–0.2)
Basos: 1 %
EOS (ABSOLUTE): 0.2 10*3/uL (ref 0.0–0.4)
Eos: 3 %
Hematocrit: 36.6 % (ref 34.0–46.6)
Hemoglobin: 12 g/dL (ref 11.1–15.9)
Immature Grans (Abs): 0 10*3/uL (ref 0.0–0.1)
Immature Granulocytes: 1 %
Lymphocytes Absolute: 1.5 10*3/uL (ref 0.7–3.1)
Lymphs: 23 %
MCH: 29.9 pg (ref 26.6–33.0)
MCHC: 32.8 g/dL (ref 31.5–35.7)
MCV: 91 fL (ref 79–97)
Monocytes Absolute: 0.6 10*3/uL (ref 0.1–0.9)
Monocytes: 10 %
Neutrophils Absolute: 4 10*3/uL (ref 1.4–7.0)
Neutrophils: 62 %
Platelets: 256 10*3/uL (ref 150–450)
RBC: 4.02 x10E6/uL (ref 3.77–5.28)
RDW: 12.4 % (ref 11.7–15.4)
WBC: 6.4 10*3/uL (ref 3.4–10.8)

## 2020-07-30 LAB — VITAMIN B12: Vitamin B-12: 230 pg/mL — ABNORMAL LOW (ref 232–1245)

## 2020-07-30 LAB — COMPREHENSIVE METABOLIC PANEL
ALT: 21 IU/L (ref 0–32)
AST: 15 IU/L (ref 0–40)
Albumin/Globulin Ratio: 1.8 (ref 1.2–2.2)
Albumin: 4.1 g/dL (ref 3.8–4.8)
Alkaline Phosphatase: 112 IU/L (ref 44–121)
BUN/Creatinine Ratio: 12 (ref 12–28)
BUN: 9 mg/dL (ref 8–27)
Bilirubin Total: 0.4 mg/dL (ref 0.0–1.2)
CO2: 26 mmol/L (ref 20–29)
Calcium: 9.4 mg/dL (ref 8.7–10.3)
Chloride: 101 mmol/L (ref 96–106)
Creatinine, Ser: 0.78 mg/dL (ref 0.57–1.00)
GFR calc Af Amer: 94 mL/min/{1.73_m2} (ref >59)
GFR calc non Af Amer: 82 mL/min/{1.73_m2} (ref >59)
Globulin, Total: 2.3 g/dL (ref 1.5–4.5)
Glucose: 83 mg/dL (ref 65–99)
Potassium: 3.6 mmol/L (ref 3.5–5.2)
Sodium: 140 mmol/L (ref 134–144)
Total Protein: 6.4 g/dL (ref 6.0–8.5)

## 2020-07-30 LAB — VITAMIN B1: Thiamine: 93.7 nmol/L (ref 66.5–200.0)

## 2020-07-30 LAB — LIPID PANEL W/O CHOL/HDL RATIO
Cholesterol, Total: 155 mg/dL (ref 100–199)
HDL: 72 mg/dL (ref >39)
LDL Chol Calc (NIH): 67 mg/dL (ref 0–99)
Triglycerides: 83 mg/dL (ref 0–149)
VLDL Cholesterol Cal: 16 mg/dL (ref 5–40)

## 2020-07-30 LAB — TSH: TSH: 2.47 u[IU]/mL (ref 0.450–4.500)

## 2020-07-30 LAB — VITAMIN D 25 HYDROXY (VIT D DEFICIENCY, FRACTURES): Vit D, 25-Hydroxy: 35.1 ng/mL (ref 30.0–100.0)

## 2020-07-30 NOTE — Telephone Encounter (Signed)
Celebrex was sent in at her last appt. It is at the pharmacy waiting for her.

## 2020-07-30 NOTE — Telephone Encounter (Signed)
Please advise pt last seen 07/25/20   Copied from [place] #[id number]. Topic: General - Other >> Jul 30, 2020  2:23 PM Leward Quan A wrote: Reason for CRM: Patient called in to inquire if Dr Wynetta Emery can please send an Rx to the pharmacy for a medication to help with the arthritis pain she is experiencing in her back. States that the Tylenols are not helping anymore. Asking for a call back with an answer please Ph# [phone] leave a message if no answer she is at work

## 2020-07-31 ENCOUNTER — Other Ambulatory Visit: Payer: Self-pay

## 2020-07-31 ENCOUNTER — Ambulatory Visit (INDEPENDENT_AMBULATORY_CARE_PROVIDER_SITE_OTHER): Payer: 59

## 2020-07-31 DIAGNOSIS — E538 Deficiency of other specified B group vitamins: Secondary | ICD-10-CM

## 2020-07-31 NOTE — Telephone Encounter (Signed)
Called and LVM letting patient know what Dr. Johnson said.  °

## 2020-08-04 ENCOUNTER — Telehealth: Payer: Self-pay

## 2020-08-04 NOTE — Telephone Encounter (Signed)
If she cannot stand up- she needs to go to the ER

## 2020-08-04 NOTE — Telephone Encounter (Signed)
Pt called back in states that she is in a lot of pain and with her work schedule and chance of getting terminated she is unable to go until Friday. Spoke with Dr Wynetta Emery advised by her that pt would benefit in the long term by going to hospital to get more imagining, possible iv, and mri done if needed versus waiting to get mri done by through office which may be a week or a few depending. Pt was on her way to work and scheduled to speak with Dr Ky Barban tomorrow, saying that she is wanting to at least get something to help with her pain until she is able to go to the er Friday.   Copied from [place] [phone]. Topic: General - Other >> Aug 04, 2020  9:54 AM Rainey Pines A wrote: Patient wants to inform PCP that the celebrex isnt working at all and she wants a neew prescription for pain medication to be called in with something stronger. Patient stated that she is in extreme pain and cannot stand up longer than 59min at a time. Please advise

## 2020-08-04 NOTE — Telephone Encounter (Signed)
appt

## 2020-08-04 NOTE — Telephone Encounter (Signed)
Patient has appointment tomorrow morning with Dr. Ky Barban.

## 2020-08-05 ENCOUNTER — Ambulatory Visit (INDEPENDENT_AMBULATORY_CARE_PROVIDER_SITE_OTHER): Payer: 59 | Admitting: Nurse Practitioner

## 2020-08-05 ENCOUNTER — Other Ambulatory Visit: Payer: Self-pay

## 2020-08-05 ENCOUNTER — Ambulatory Visit: Payer: 59 | Admitting: Family Medicine

## 2020-08-05 ENCOUNTER — Encounter: Payer: Self-pay | Admitting: Nurse Practitioner

## 2020-08-05 VITALS — BP 124/85 | HR 89 | Temp 98.3°F | Ht 63.39 in | Wt 191.0 lb

## 2020-08-05 DIAGNOSIS — M545 Low back pain, unspecified: Secondary | ICD-10-CM | POA: Insufficient documentation

## 2020-08-05 MED ORDER — GABAPENTIN 100 MG PO CAPS
100.0000 mg | ORAL_CAPSULE | Freq: Every day | ORAL | 0 refills | Status: DC
Start: 2020-08-05 — End: 2020-08-18

## 2020-08-05 NOTE — Assessment & Plan Note (Signed)
Acute, ongoing.  No red flags in history or on examination.  X-ray imaging showed degenerative changes.  Will obtain MRI imaging and in meantime, treat with topical voltaren gel and low-dose gabapentin.

## 2020-08-05 NOTE — Progress Notes (Signed)
BP 124/85   Pulse 89   Temp 98.3 F (36.8 C) (Oral)   Ht 5' 3.39" (1.61 m)   Wt 191 lb (86.6 kg)   SpO2 96%   BMI 33.42 kg/m    Subjective:    Patient ID: Meagan York, female    DOB: Aug 19, 1956, 64 y.o.   MRN: [id number]  HPI: [name] is a 64 y.o. female presenting for ongoing back pain.  Chief Complaint  Patient presents with  . Back Pain    F/U  Celebrex is not working, severe pain x 2 weeks   BACK PAIN Has tried celebrex and duloxetine without success.  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  Has worked there for 5-6 years.  Is fearful that this heavy lifting is causing her this pain. Duration: months Mechanism of injury: unknown Location: lower midline, half way up to spine Onset: gradual Severity: severe Quality: constant Frequency: constant Radiation: none Aggravating factors: Alleviating factors: sitting Status: stable Treatments attempted:  Celebrex, cymbalta Relief with NSAIDs?: cannot take Nighttime pain:  no Paresthesias / decreased sensation:  no Bowel / bladder incontinence:  no Fevers:  no Dysuria / urinary frequency:  no  Allergies  Allergen Reactions  . Acetaminophen Nausea And Vomiting  . Aspirin     Bleeding ulcers  . Oxycodone-Acetaminophen Nausea Only  . Tramadol Nausea And Vomiting   Outpatient Encounter Medications as of 08/05/2020  Medication Sig Note  . albuterol (PROAIR HFA) 108 (90 Base) MCG/ACT inhaler Inhale 1-2 puffs into the lungs every 4 (four) hours as needed for wheezing or shortness of breath.   Marland Kitchen amLODipine (NORVASC) 5 MG tablet Take 1 tablet (5 mg total) by mouth daily.   . budesonide-formoterol (SYMBICORT) 160-4.5 MCG/ACT inhaler Inhale 2 puffs into the lungs 2 (two) times daily.   Marland Kitchen buPROPion (WELLBUTRIN XL) 300 MG 24 hr tablet Take 1 tablet (300 mg total) by mouth daily.   . celecoxib (CELEBREX) 100 MG capsule Take 1 capsule (100 mg total) by mouth 2 (two) times daily.   .  Cholecalciferol (VITAMIN D3) 1000 units CAPS Take 1,000 Units by mouth daily. 06/17/2016: Received from: [place]: Take 1,000 Units by mouth once daily.  . clopidogrel (PLAVIX) 75 MG tablet Take 1 tablet (75 mg total) by mouth daily.   Marland Kitchen omeprazole (PRILOSEC) 40 MG capsule Take 1 capsule (40 mg total) by mouth daily.   . ondansetron (ZOFRAN ODT) 4 MG disintegrating tablet Take 1 tablet (4 mg total) by mouth every 8 (eight) hours as needed for nausea or vomiting.   Marland Kitchen Spacer/Aero-Holding Chambers DEVI 1 each by Does not apply route daily. 05/31/2019: As needed  . telmisartan (MICARDIS) 80 MG tablet Take 1 tablet (80 mg total) by mouth daily.   . Tiotropium Bromide Monohydrate (SPIRIVA RESPIMAT) 2.5 MCG/ACT AERS Inhale 1 puff into the lungs daily.   . traZODone (DESYREL) 50 MG tablet Take 1-2 tablets (50-100 mg total) by mouth at bedtime as needed for sleep.   Marland Kitchen atorvastatin (LIPITOR) 40 MG tablet Take 1 tablet (40 mg total) by mouth daily at 6 PM.   . gabapentin (NEURONTIN) 100 MG capsule Take 1 capsule (100 mg total) by mouth at bedtime.   . [DISCONTINUED] DULoxetine (CYMBALTA) 20 MG capsule Take 1 capsule (20 mg total) by mouth daily. (Patient not taking: Reported on 08/05/2020)    Facility-Administered Encounter Medications as of 08/05/2020  Medication  . cyanocobalamin ((  VITAMIN B-12)) injection 1,000 mcg  . [START ON 08/27/2020] cyanocobalamin ((VITAMIN B-12)) injection 1,000 mcg   Patient Active Problem List   Diagnosis Date Noted  . Acute midline low back pain without sciatica 08/05/2020  . B12 deficiency 07/28/2020  . History of stroke 03/06/2020  . Hyperlipidemia 11/12/2019  . Depression 11/12/2019  . Right arm weakness 11/02/2019  . Pap smear abnormality of cervix/human papillomavirus (HPV) positive 07/03/2018  . Class 1 obesity due to excess calories with serious comorbidity and body mass index (BMI) of 33.0 to 33.9 in adult 04/20/2018  . Benign  hypertensive renal disease 11/24/2015  . Tobacco abuse 11/24/2015  . Essential (primary) hypertension 11/24/2015  . Heel pain, bilateral 10/24/2015  . Elevated blood-pressure reading without diagnosis of hypertension 10/24/2015  . Pain of right foot 10/24/2015  . Arthritis   . COPD (chronic obstructive pulmonary disease) ([place])   . GERD (gastroesophageal reflux disease)   . Upper GI bleed 07/05/2015  . GI bleed due to NSAIDs 06/14/2015  . History of breast cancer 04/25/2015  . Primary malignant neoplasm ([place]) 04/25/2015   Past Medical History:  Diagnosis Date  . Arthritis   . Asthma   . Bilateral breast cysts   . 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 Complication of anesthesia    pt woke up during breast surgery (1995)  . COPD (chronic obstructive pulmonary disease) ([place])   . Emphysema lung ([place])   . GERD (gastroesophageal reflux disease)   . Headache    sinus  . Hemorrhoids   . Hypertension   . Irritable bowel disease   . Personal history of radiation therapy 2016   mammosite   Relevant past medical, surgical, family and social history reviewed and updated as indicated. Interim medical history since our last visit reviewed. Allergies and medications reviewed and updated.  Review of Systems  Constitutional: Negative.  Negative for activity change, appetite change and fever.  Genitourinary: Negative for dysuria, enuresis and urgency.  Musculoskeletal: Positive for back pain. Negative for arthralgias, gait problem, myalgias, neck pain and neck stiffness.  Skin: Negative.  Negative for rash.  Neurological: Negative.  Negative for dizziness, light-headedness and headaches.  Psychiatric/Behavioral: Negative.     Per HPI unless specifically indicated above     Objective:    BP 124/85   Pulse 89   Temp 98.3 F (36.8 C) (Oral)   Ht 5' 3.39" (1.61 m)   Wt 191 lb (86.6 kg)   SpO2 96%   BMI 33.42 kg/m   Wt  Readings from Last 3 Encounters:  08/05/20 191 lb (86.6 kg)  07/25/20 190 lb (86.2 kg)  07/10/20 186 lb 6.4 oz (84.6 kg)    Physical Exam Vitals and nursing note reviewed.  Constitutional:      General: She is not in acute distress.    Appearance: Normal appearance. She is obese. She is not toxic-appearing.  Eyes:     General: No scleral icterus.    Extraocular Movements: Extraocular movements intact.  Musculoskeletal:     Cervical back: Normal.     Thoracic back: Bony tenderness present. Normal range of motion.     Lumbar back: Tenderness and bony tenderness present. No swelling. Normal range of motion.     Comments: Tender to palpation along lower back and up spine to mid back  Skin:    General: Skin is warm and dry.     Coloration: Skin is not  jaundiced or pale.     Findings: No erythema.  Neurological:     General: No focal deficit present.     Mental Status: She is alert and oriented to person, place, and time.     Motor: No weakness.     Gait: Gait normal.  Psychiatric:        Mood and Affect: Mood normal.        Behavior: Behavior normal.        Thought Content: Thought content normal.        Judgment: Judgment normal.       Assessment & Plan:   Problem List Items Addressed This Visit      Other   Acute midline low back pain without sciatica - Primary    Acute, ongoing.  No red flags in history or on examination.  X-ray imaging showed degenerative changes.  Will obtain MRI imaging and in meantime, treat with topical voltaren gel and low-dose gabapentin.        Relevant Orders   MR Lumbar Spine Wo Contrast       Follow up plan: Return in about 2 weeks (around 08/19/2020) for back pain f/u if not improving.

## 2020-08-12 ENCOUNTER — Ambulatory Visit: Payer: 59

## 2020-08-12 ENCOUNTER — Other Ambulatory Visit: Payer: Self-pay

## 2020-08-14 ENCOUNTER — Ambulatory Visit: Payer: Self-pay

## 2020-08-15 ENCOUNTER — Telehealth: Payer: Self-pay | Admitting: Licensed Clinical Social Worker

## 2020-08-15 ENCOUNTER — Telehealth: Payer: 59

## 2020-08-15 NOTE — Telephone Encounter (Signed)
  Chronic Care Management    Clinical Social Work General Follow Up Note  08/15/2020 Name: RANEISHA BRESS MRN: [id number] DOB: [birth year]  FRED HAMMES is a 64 y.o. year old female who is a primary care patient of Valerie Roys, DO. The CCM team was consulted for assistance with [employer] .   Review of patient status, including review of consultants reports, relevant laboratory and other test results, and collaboration with appropriate care team members and the patient's provider was performed as part of comprehensive patient evaluation and provision of chronic care management services.    LCSW completed CCM outreach attempt today but was unable to reach patient successfully. A HIPPA compliant voice message was left encouraging patient to return call once available. LCSW will ask Scheduling Care Guide to reschedule CCM SW appointment with patient as well.  Outpatient Encounter Medications as of 08/15/2020  Medication Sig Note  . albuterol (PROAIR HFA) 108 (90 Base) MCG/ACT inhaler Inhale 1-2 puffs into the lungs every 4 (four) hours as needed for wheezing or shortness of breath.   Marland Kitchen amLODipine (NORVASC) 5 MG tablet Take 1 tablet (5 mg total) by mouth daily.   Marland Kitchen atorvastatin (LIPITOR) 40 MG tablet Take 1 tablet (40 mg total) by mouth daily at 6 PM.   . budesonide-formoterol (SYMBICORT) 160-4.5 MCG/ACT inhaler Inhale 2 puffs into the lungs 2 (two) times daily.   Marland Kitchen buPROPion (WELLBUTRIN XL) 300 MG 24 hr tablet Take 1 tablet (300 mg total) by mouth daily.   . celecoxib (CELEBREX) 100 MG capsule Take 1 capsule (100 mg total) by mouth 2 (two) times daily.   . Cholecalciferol (VITAMIN D3) 1000 units CAPS Take 1,000 Units by mouth daily. 06/17/2016: Received from: [place]: Take 1,000 Units by mouth once daily.  . clopidogrel (PLAVIX) 75 MG tablet Take 1 tablet (75 mg total) by mouth daily.   Marland Kitchen gabapentin (NEURONTIN) 100 MG capsule Take 1 capsule (100 mg  total) by mouth at bedtime.   Marland Kitchen omeprazole (PRILOSEC) 40 MG capsule Take 1 capsule (40 mg total) by mouth daily.   . ondansetron (ZOFRAN ODT) 4 MG disintegrating tablet Take 1 tablet (4 mg total) by mouth every 8 (eight) hours as needed for nausea or vomiting.   Marland Kitchen Spacer/Aero-Holding Chambers DEVI 1 each by Does not apply route daily. 05/31/2019: As needed  . telmisartan (MICARDIS) 80 MG tablet Take 1 tablet (80 mg total) by mouth daily.   . Tiotropium Bromide Monohydrate (SPIRIVA RESPIMAT) 2.5 MCG/ACT AERS Inhale 1 puff into the lungs daily.   . traZODone (DESYREL) 50 MG tablet Take 1-2 tablets (50-100 mg total) by mouth at bedtime as needed for sleep.    Facility-Administered Encounter Medications as of 08/15/2020  Medication  . cyanocobalamin ((VITAMIN B-12)) injection 1,000 mcg  . [START ON 08/27/2020] cyanocobalamin ((VITAMIN B-12)) injection 1,000 mcg    Follow Up Plan: Scheduling Care Guide will reach out to patient to reschedule appointment.   Eula Fried, BSW, MSW, [facility] [email or web address] Phone: [phone]

## 2020-08-18 ENCOUNTER — Ambulatory Visit (INDEPENDENT_AMBULATORY_CARE_PROVIDER_SITE_OTHER): Payer: 59 | Admitting: Family Medicine

## 2020-08-18 ENCOUNTER — Other Ambulatory Visit: Payer: Self-pay

## 2020-08-18 ENCOUNTER — Encounter: Payer: Self-pay | Admitting: Family Medicine

## 2020-08-18 VITALS — BP 114/75 | HR 84 | Temp 98.0°F | Ht 64.0 in | Wt 193.2 lb

## 2020-08-18 DIAGNOSIS — M4726 Other spondylosis with radiculopathy, lumbar region: Secondary | ICD-10-CM | POA: Diagnosis not present

## 2020-08-18 MED ORDER — GABAPENTIN 100 MG PO CAPS: 100.0000 mg | ORAL_CAPSULE | Freq: Three times a day (TID) | ORAL | 1 refills | Status: DC

## 2020-08-18 NOTE — Progress Notes (Signed)
BP 114/75   Pulse 84   Temp 98 F (36.7 C) (Oral)   Ht 5\' 4"  (1.626 m)   Wt 193 lb 3.2 oz (87.6 kg)   SpO2 99%   BMI 33.16 kg/m    Subjective:    Patient ID: Meagan York, female    DOB: 03-03-1956, 64 y.o.   MRN: [id number]  HPI: Meagan York is a 64 y.o. female  Chief Complaint  Patient presents with  . Back Pain  . B12 Injection   BACK PAIN- notes that her back is doing OK right now, but gets singnificantly worse as the week goes on. Has not been able to do PT due to cost. Has not been able to get MRI due to insurance issues. Pain is not significantly better.  Duration: months, worse the last 4 months Mechanism of injury: unknown Location: bilateral low back Onset: gradual Severity: 6/10 Quality: snapping, tight pain Frequency: constant Radiation:  R leg above the knee and L leg above the knee Aggravating factors: standing Alleviating factors: sitting Status: worse Treatments attempted: gabapentin, rest, ice, heat, APAP, ibuprofen and aleve  Relief with NSAIDs?: mild Nighttime pain:  yes Paresthesias / decreased sensation:  no Bowel / bladder incontinence:  no Fevers:  no Dysuria / urinary frequency:  no  Relevant past medical, surgical, family and social history reviewed and updated as indicated. Interim medical history since our last visit reviewed. Allergies and medications reviewed and updated.  Review of Systems  Constitutional: Negative.   Respiratory: Negative.   Cardiovascular: Negative.   Gastrointestinal: Negative.   Musculoskeletal: Positive for back pain, gait problem and myalgias. Negative for arthralgias, joint swelling, neck pain and neck stiffness.  Skin: Negative.   Neurological: Positive for weakness. Negative for dizziness, tremors, seizures, syncope, facial asymmetry, speech difficulty, light-headedness, numbness and headaches.  Psychiatric/Behavioral: Negative.     Per HPI unless specifically indicated above     Objective:      BP 114/75   Pulse 84   Temp 98 F (36.7 C) (Oral)   Ht 5\' 4"  (1.626 m)   Wt 193 lb 3.2 oz (87.6 kg)   SpO2 99%   BMI 33.16 kg/m   Wt Readings from Last 3 Encounters:  08/18/20 193 lb 3.2 oz (87.6 kg)  08/05/20 191 lb (86.6 kg)  07/25/20 190 lb (86.2 kg)    Physical Exam Vitals and nursing note reviewed.  Constitutional:      General: She is not in acute distress.    Appearance: Normal appearance. She is not ill-appearing, toxic-appearing or diaphoretic.  HENT:     Head: Normocephalic and atraumatic.     Right Ear: External ear normal.     Left Ear: External ear normal.     Nose: Nose normal.     Mouth/Throat:     Mouth: Mucous membranes are moist.     Pharynx: Oropharynx is clear.  Eyes:     General: No scleral icterus.       Right eye: No discharge.        Left eye: No discharge.     Extraocular Movements: Extraocular movements intact.     Conjunctiva/sclera: Conjunctivae normal.     Pupils: Pupils are equal, round, and reactive to light.  Cardiovascular:     Rate and Rhythm: Normal rate and regular rhythm.     Pulses: Normal pulses.     Heart sounds: Normal heart sounds. No murmur heard.  No friction rub. No gallop.  Pulmonary:     Effort: Pulmonary effort is normal. No respiratory distress.     Breath sounds: Normal breath sounds. No stridor. No wheezing, rhonchi or rales.  Chest:     Chest wall: No tenderness.  Musculoskeletal:        General: Normal range of motion.     Cervical back: Normal range of motion and neck supple.  Skin:    General: Skin is warm and dry.     Capillary Refill: Capillary refill takes less than 2 seconds.     Coloration: Skin is not jaundiced or pale.     Findings: No bruising, erythema, lesion or rash.  Neurological:     General: No focal deficit present.     Mental Status: She is alert and oriented to person, place, and time. Mental status is at baseline.  Psychiatric:        Mood and Affect: Mood normal.        Behavior:  Behavior normal.        Thought Content: Thought content normal.        Judgment: Judgment normal.     Results for orders placed or performed in visit on 07/25/20  Microscopic Examination   BLD  Result Value Ref Range   WBC, UA None seen 0 - 5 /hpf   RBC 0-2 0 - 2 /hpf   Epithelial Cells (non renal) 0-10 0 - 10 /hpf   Bacteria, UA None seen None seen/Few  Bayer DCA Hb A1c Waived  Result Value Ref Range   HB A1C (BAYER DCA - WAIVED) 5.2 <7.0 %  CBC with Differential/Platelet  Result Value Ref Range   WBC 6.4 3.4 - 10.8 x10E3/uL   RBC 4.02 3.77 - 5.28 x10E6/uL   Hemoglobin 12.0 11.1 - 15.9 g/dL   Hematocrit 36.6 34.0 - 46.6 %   MCV 91 79 - 97 fL   MCH 29.9 26.6 - 33.0 pg   MCHC 32.8 31 - 35 g/dL   RDW 12.4 11.7 - 15.4 %   Platelets 256 150 - 450 x10E3/uL   Neutrophils 62 Not Estab. %   Lymphs 23 Not Estab. %   Monocytes 10 Not Estab. %   Eos 3 Not Estab. %   Basos 1 Not Estab. %   Neutrophils Absolute 4.0 1.40 - 7.00 x10E3/uL   Lymphocytes Absolute 1.5 0 - 3 x10E3/uL   Monocytes Absolute 0.6 0 - 0 x10E3/uL   EOS (ABSOLUTE) 0.2 0.0 - 0.4 x10E3/uL   Basophils Absolute 0.0 0 - 0 x10E3/uL   Immature Granulocytes 1 Not Estab. %   Immature Grans (Abs) 0.0 0.0 - 0.1 x10E3/uL  Comprehensive metabolic panel  Result Value Ref Range   Glucose 83 65 - 99 mg/dL   BUN 9 8 - 27 mg/dL   Creatinine, Ser 0.78 0.57 - 1.00 mg/dL   GFR calc non Af Amer 82 >59 mL/min/1.73   GFR calc Af Amer 94 >59 mL/min/1.73   BUN/Creatinine Ratio 12 12 - 28   Sodium 140 134 - 144 mmol/L   Potassium 3.6 3.5 - 5.2 mmol/L   Chloride 101 96 - 106 mmol/L   CO2 26 20 - 29 mmol/L   Calcium 9.4 8.7 - 10.3 mg/dL   Total Protein 6.4 6.0 - 8.5 g/dL   Albumin 4.1 3.8 - 4.8 g/dL   Globulin, Total 2.3 1.5 - 4.5 g/dL   Albumin/Globulin Ratio 1.8 1.2 - 2.2   Bilirubin Total 0.4 0.0 - 1.2 mg/dL  Alkaline Phosphatase 112 44 - 121 IU/L   AST 15 0 - 40 IU/L   ALT 21 0 - 32 IU/L  Lipid Panel w/o Chol/HDL Ratio    Result Value Ref Range   Cholesterol, Total 155 100 - 199 mg/dL   Triglycerides 83 0 - 149 mg/dL   HDL 72 >39 mg/dL   VLDL Cholesterol Cal 16 5 - 40 mg/dL   LDL Chol Calc (NIH) 67 0 - 99 mg/dL  TSH  Result Value Ref Range   TSH 2.470 0.450 - 4.500 uIU/mL  VITAMIN D 25 Hydroxy (Vit-D Deficiency, Fractures)  Result Value Ref Range   Vit D, 25-Hydroxy 35.1 30.0 - 100.0 ng/mL  Urinalysis, Routine w reflex microscopic  Result Value Ref Range   Specific Gravity, UA 1.015 1.005 - 1.030   pH, UA 5.5 5.0 - 7.5   Color, UA Yellow Yellow   Appearance Ur Clear Clear   Leukocytes,UA Negative Negative   Protein,UA Negative Negative/Trace   Glucose, UA Negative Negative   Ketones, UA Negative Negative   RBC, UA Trace (A) Negative   Bilirubin, UA Negative Negative   Urobilinogen, Ur 0.2 0.2 - 1.0 mg/dL   Nitrite, UA Negative Negative   Microscopic Examination See below:   B12  Result Value Ref Range   Vitamin B-12 230 (L) 232 - 1,245 pg/mL  Vitamin B1  Result Value Ref Range   Thiamine 93.7 66.5 - 200.0 nmol/L      Assessment & Plan:   Problem List Items Addressed This Visit      Nervous and Auditory   Osteoarthritis of spine with radiculopathy, lumbar region - Primary    Not significantly improved with the gabapentin. Will increase to TID dosing. Cannot do PT due to cost per patient. Will get her into ortho for further evaluation. Continue to monitor. Call with any concerns.       Relevant Medications   gabapentin (NEURONTIN) 100 MG capsule   Other Relevant Orders   Ambulatory referral to Orthopedic Surgery       Follow up plan: Return in about 4 weeks (around 09/15/2020).

## 2020-08-18 NOTE — Assessment & Plan Note (Signed)
Not significantly improved with the gabapentin. Will increase to TID dosing. Cannot do PT due to cost per patient. Will get her into ortho for further evaluation. Continue to monitor. Call with any concerns.

## 2020-08-25 ENCOUNTER — Other Ambulatory Visit: Payer: Self-pay

## 2020-08-25 ENCOUNTER — Ambulatory Visit (INDEPENDENT_AMBULATORY_CARE_PROVIDER_SITE_OTHER): Payer: 59

## 2020-08-25 DIAGNOSIS — E538 Deficiency of other specified B group vitamins: Secondary | ICD-10-CM

## 2020-08-26 ENCOUNTER — Telehealth: Payer: Self-pay

## 2020-08-26 NOTE — Telephone Encounter (Signed)
Pt returned my call and declines r/s. Pt states that she is doing ok and does not feel she needs to r/s at this time. Pt advised to call if she changes her mind.  Thanks  [employer]

## 2020-08-26 NOTE — Chronic Care Management (AMB) (Signed)
  Care Management   Note  08/26/2020 Name: Meagan York MRN: [id number] DOB: 1956/06/06  Meagan York is a 64 y.o. year old female who is a primary care patient of Valerie Roys, DO and is actively engaged with the care management team. I reached out to [name] by phone today to assist with re-scheduling an initial visit with the Licensed Clinical Social Worker  Follow up plan: Patient declines further follow up and engagement by the care management team. Appropriate care team members and provider have been notified via electronic communication.   Noreene Larsson, [place], [place], [place] [address] Direct Dial: [phone] [email or web address]: [email or web address]

## 2020-08-26 NOTE — Chronic Care Management (AMB) (Signed)
  Care Management   Note  08/26/2020 Name: ARASELY AKKERMAN MRN: [id number] DOB: 1955/11/25  Meagan York is a 64 y.o. year old female who is a primary care patient of Valerie Roys, DO and is actively engaged with the care management team. I reached out to [name] by phone today to assist with re-scheduling an initial visit with the Licensed Clinical Social Worker  Follow up plan: Unsuccessful telephone outreach attempt made. A HIPAA compliant phone message was left for the patient providing contact information and requesting a return call.  The care management team will reach out to the patient again over the next 7 days.  If patient returns call to provider office, please advise to call Wilmer  at [place], [place], [place], [place] [address] Direct Dial: [phone] [email or web address]: [email or web address]

## 2020-09-01 ENCOUNTER — Ambulatory Visit: Payer: 59

## 2020-09-04 ENCOUNTER — Ambulatory Visit: Payer: 59

## 2020-09-08 ENCOUNTER — Ambulatory Visit: Payer: 59

## 2020-09-10 ENCOUNTER — Ambulatory Visit: Payer: 59

## 2020-09-19 ENCOUNTER — Ambulatory Visit: Payer: 59 | Admitting: Family Medicine

## 2020-10-02 ENCOUNTER — Ambulatory Visit: Payer: 59 | Admitting: Family Medicine

## 2020-10-03 ENCOUNTER — Other Ambulatory Visit: Payer: Self-pay | Admitting: Family Medicine

## 2020-10-08 ENCOUNTER — Encounter: Payer: Self-pay | Admitting: Family Medicine

## 2020-10-08 ENCOUNTER — Telehealth: Payer: Self-pay

## 2020-10-08 ENCOUNTER — Telehealth (INDEPENDENT_AMBULATORY_CARE_PROVIDER_SITE_OTHER): Payer: 59 | Admitting: Family Medicine

## 2020-10-08 ENCOUNTER — Other Ambulatory Visit: Payer: Self-pay

## 2020-10-08 DIAGNOSIS — Z20822 Contact with and (suspected) exposure to covid-19: Secondary | ICD-10-CM

## 2020-10-08 MED ORDER — PREDNISONE 50 MG PO TABS: 50.0000 mg | ORAL_TABLET | Freq: Every day | ORAL | 0 refills | Status: DC

## 2020-10-08 MED ORDER — BENZONATATE 200 MG PO CAPS: 200.0000 mg | ORAL_CAPSULE | Freq: Two times a day (BID) | ORAL | 0 refills | Status: DC | PRN

## 2020-10-08 NOTE — Telephone Encounter (Signed)
Scheduled for 4 today  Copied from [place] [phone]. Topic: General - Other >> Oct 08, 2020 12:42 PM Leward Quan A wrote: Reason for CRM: Patient called in to inquire of Dr Wynetta Emery if she can get something for nausea and cough. Per patient she was diagnosed with Covid today but had symptoms since Monday. Say if she need a visit she can do a phone call but cant do a video, please call  Ph# [phone]

## 2020-10-08 NOTE — Patient Instructions (Signed)
It was great to see you!  Our plans for today:  - See below for self-isolation guidelines. You may end your quarantine if your test is negative or if positive, once you are 10 days from symptom onset and fever free for 24 hours without use of tylenol or ibuprofen.  - We will be referring you for COVID treatment if your test is positive. Someone will be contacting you about scheduling this if you qualify. We are experiencing a national shortage of monoclonal antibody treatment and the oral antivirals as well as a backlog of patients needing treatment so this may cause a delay. - I recommend getting vaccinated once you are healed from your current infection. If you get the antibody infusion, you must wait  3 months before vaccination.  - Certainly, if you are having difficulties breathing or unable to keep down fluids, go to the Emergency Department.   Take care and seek immediate care sooner if you develop any concerns.   Dr. Ky Barban     Person Under Monitoring Name: Meagan York  Location: [place] [address]   Infection Prevention Recommendations for Individuals Confirmed to have, or Being Evaluated for, 2019 Novel Coronavirus (COVID-19) Infection Who Receive Care at Home  Individuals who are confirmed to have, or are being evaluated for, COVID-19 should follow the prevention steps below until a healthcare provider or local or state health department says they can return to normal activities.  Stay home except to get medical care You should restrict activities outside your home, except for getting medical care. Do not go to work, school, or public areas, and do not use public transportation or taxis.  Call ahead before visiting your doctor Before your medical appointment, call the healthcare provider and tell them that you have, or are being evaluated for, COVID-19 infection. This will help the healthcare provider's office take steps to keep other people  from getting infected. Ask your healthcare provider to call the local or state health department.  Monitor your symptoms Seek prompt medical attention if your illness is worsening (e.g., difficulty breathing). Before going to your medical appointment, call the healthcare provider and tell them that you have, or are being evaluated for, COVID-19 infection. Ask your healthcare provider to call the local or state health department.  Wear a facemask You should wear a facemask that covers your nose and mouth when you are in the same room with other people and when you visit a healthcare provider. People who live with or visit you should also wear a facemask while they are in the same room with you.  Separate yourself from other people in your home As much as possible, you should stay in a different room from other people in your home. Also, you should use a separate bathroom, if available.  Avoid sharing household items You should not share dishes, drinking glasses, cups, eating utensils, towels, bedding, or other items with other people in your home. After using these items, you should wash them thoroughly with soap and water.  Cover your coughs and sneezes Cover your mouth and nose with a tissue when you cough or sneeze, or you can cough or sneeze into your sleeve. Throw used tissues in a lined trash can, and immediately wash your hands with soap and water for at least 20 seconds or use an alcohol-based hand rub.  Wash your [facility] your hands often and thoroughly with soap and water for at least 20 seconds. You  can use an alcohol-based hand sanitizer if soap and water are not available and if your hands are not visibly dirty. Avoid touching your eyes, nose, and mouth with unwashed hands.   Prevention Steps for Caregivers and Household Members of Individuals Confirmed to have, or Being Evaluated for, COVID-19 Infection Being Cared for in the Home  If you live with, or provide care  at home for, a person confirmed to have, or being evaluated for, COVID-19 infection please follow these guidelines to prevent infection:  Follow healthcare provider's instructions Make sure that you understand and can help the patient follow any healthcare provider instructions for all care.  Provide for the patient's basic needs You should help the patient with basic needs in the home and provide support for getting groceries, prescriptions, and other personal needs.  Monitor the patient's symptoms If they are getting sicker, call his or her medical provider and tell them that the patient has, or is being evaluated for, COVID-19 infection. This will help the healthcare provider's office take steps to keep other people from getting infected. Ask the healthcare provider to call the local or state health department.  Limit the number of people who have contact with the patient  If possible, have only one caregiver for the patient.  Other household members should stay in another home or place of residence. If this is not possible, they should stay  in another room, or be separated from the patient as much as possible. Use a separate bathroom, if available.  Restrict visitors who do not have an essential need to be in the home.  Keep older adults, very young children, and other sick people away from the patient Keep older adults, very young children, and those who have compromised immune systems or chronic health conditions away from the patient. This includes people with chronic heart, lung, or kidney conditions, diabetes, and cancer.  Ensure good ventilation Make sure that shared spaces in the home have good air flow, such as from an air conditioner or an opened window, weather permitting.  Wash your hands often  Wash your hands often and thoroughly with soap and water for at least 20 seconds. You can use an alcohol based hand sanitizer if soap and water are not available and if your  hands are not visibly dirty.  Avoid touching your eyes, nose, and mouth with unwashed hands.  Use disposable paper towels to dry your hands. If not available, use dedicated cloth towels and replace them when they become wet.  Wear a facemask and gloves  Wear a disposable facemask at all times in the room and gloves when you touch or have contact with the patient's blood, body fluids, and/or secretions or excretions, such as sweat, saliva, sputum, nasal mucus, vomit, urine, or feces.  Ensure the mask fits over your nose and mouth tightly, and do not touch it during use.  Throw out disposable facemasks and gloves after using them. Do not reuse.  Wash your hands immediately after removing your facemask and gloves.  If your personal clothing becomes contaminated, carefully remove clothing and launder. Wash your hands after handling contaminated clothing.  Place all used disposable facemasks, gloves, and other waste in a lined container before disposing them with other household waste.  Remove gloves and wash your hands immediately after handling these items.  Do not share dishes, glasses, or other household items with the patient  Avoid sharing household items. You should not share dishes, drinking glasses, cups, eating utensils, towels,  bedding, or other items with a patient who is confirmed to have, or being evaluated for, COVID-19 infection.  After the person uses these items, you should wash them thoroughly with soap and water.  Wash laundry thoroughly  Immediately remove and wash clothes or bedding that have blood, body fluids, and/or secretions or excretions, such as sweat, saliva, sputum, nasal mucus, vomit, urine, or feces, on them.  Wear gloves when handling laundry from the patient.  Read and follow directions on labels of laundry or clothing items and detergent. In general, wash and dry with the warmest temperatures recommended on the label.  Clean all areas the individual  has used often  Clean all touchable surfaces, such as counters, tabletops, doorknobs, bathroom fixtures, toilets, phones, keyboards, tablets, and bedside tables, every day. Also, clean any surfaces that may have blood, body fluids, and/or secretions or excretions on them.  Wear gloves when cleaning surfaces the patient has come in contact with.  Use a diluted bleach solution (e.g., dilute bleach with 1 part bleach and 10 parts water) or a household disinfectant with a label that says EPA-registered for coronaviruses. To make a bleach solution at home, add 1 tablespoon of bleach to 1 quart (4 cups) of water. For a larger supply, add  cup of bleach to 1 gallon (16 cups) of water.  Read labels of cleaning products and follow recommendations provided on product labels. Labels contain instructions for safe and effective use of the cleaning product including precautions you should take when applying the product, such as wearing gloves or eye protection and making sure you have good ventilation during use of the product.  Remove gloves and wash hands immediately after cleaning.  Monitor yourself for signs and symptoms of illness Caregivers and household members are considered close contacts, should monitor their health, and will be asked to limit movement outside of the home to the extent possible. Follow the monitoring steps for close contacts listed on the symptom monitoring form.   ? If you have additional questions, contact your local health department or call the epidemiologist on call at [phone] (available 24/7). ? This guidance is subject to change. For the most up-to-date guidance from [facility], please refer to their website: [email or web address]

## 2020-10-08 NOTE — Progress Notes (Signed)
Virtual Visit via Video Note  I connected with Meagan York on 10/08/20 at  4:00 PM EST by a video enabled telemedicine application and verified that I am speaking with the correct person using two identifiers.  Location: Patient: home Provider: [facility]   I discussed the limitations of evaluation and management by telemedicine and the availability of in person appointments. The patient expressed understanding and agreed to proceed.  History of Present Illness:  UPPER RESPIRATORY TRACT INFECTION - COVID exposure, son tested positive for COVID - body aches.  - symptom onset 10/06/20 - unvaccinated - not using daily inhalers for COPD, only as needed - hasn't had to use albuterol  Worst symptom: body aches, cough Fever: no Cough: yes Shortness of breath: no Wheezing: no Chest pain: no Chest tightness: no Chest congestion: yes Nasal congestion: yes Runny nose: yes Sore throat: yes Headache: yes Vomiting: no Fatigue: yes Sick contacts: yes Recurrent sinusitis: no Treatments attempted: none    Observations/Objective:  Patient had trouble connecting to video visit, entirety of visit conducted over the phone.  Speaks in full sentences, no respiratory distress. Congested sounding.    Assessment and Plan:  COVID exposure With mild-mod sx. Will test for COVID, would be a candidate for [facility] referral if positive. Will send prednisone burst given COPD and tessalon perles. Reviewed OTC symptom relief, self-quarantine, and emergency precautions.    I discussed the assessment and treatment plan with the patient. The patient was provided an opportunity to ask questions and all were answered. The patient agreed with the plan and demonstrated an understanding of the instructions.   The patient was advised to call back or seek an in-person evaluation if the symptoms worsen or if the condition fails to improve as anticipated.  I provided 11 minutes of non-face-to-face time during this  encounter.   Myles Gip, DO

## 2020-10-09 ENCOUNTER — Other Ambulatory Visit: Payer: Self-pay

## 2020-10-09 ENCOUNTER — Other Ambulatory Visit: Payer: 59

## 2020-10-09 DIAGNOSIS — J069 Acute upper respiratory infection, unspecified: Secondary | ICD-10-CM

## 2020-10-11 LAB — SARS-COV-2, NAA 2 DAY TAT

## 2020-10-11 LAB — NOVEL CORONAVIRUS, NAA: SARS-CoV-2, NAA: DETECTED — AB

## 2020-11-04 ENCOUNTER — Ambulatory Visit: Payer: Self-pay | Admitting: *Deleted

## 2020-11-04 ENCOUNTER — Telehealth: Payer: Self-pay | Admitting: Family Medicine

## 2020-11-04 NOTE — Telephone Encounter (Signed)
Patient states she was diagnosed with COVID 10/09/20. Patient had called to request inhaler for wheezing- but transferred to triage due to symptoms.Patient is still coughing with chest congestion. Patient has returned to work and reports she does feel better- but can't get rid of cough and congestion. Patient reports she is having wheezing off/on. Advised patient she needs follow up appointment- she may need additional treatment and testing. Call to office- no appointment available today/tomorrow. Request note for PCP review. Patient advised to expect call back- she works [employer] and will need morning appointment. Patient states a meesage may be left on her voice mail- she can't take her phone on the floor at work- but she will check her messages for appointment/instructions.  Reason for Disposition . [1] PERSISTING SYMPTOMS OF COVID-19 AND [2] NEW symptom AND [3] could be serious  Answer Assessment - Initial Assessment Questions 1. RESPIRATORY STATUS: "Describe your breathing?" (e.g., wheezing, shortness of breath, unable to speak, severe coughing)      Wheezing, coughing with congestion 2. ONSET: "When did this breathing problem begin?"      2 weeks 3. PATTERN "Does the difficult breathing come and go, or has it been constant since it started?"      Wheezing comes and goes 4. SEVERITY: "How bad is your breathing?" (e.g., mild, moderate, severe)    - MILD: No SOB at rest, mild SOB with walking, speaks normally in sentences, can lay down, no retractions, pulse < 100.    - MODERATE: SOB at rest, SOB with minimal exertion and prefers to sit, cannot lie down flat, speaks in phrases, mild retractions, audible wheezing, pulse 100-120.    - SEVERE: Very SOB at rest, speaks in single words, struggling to breathe, sitting hunched forward, retractions, pulse > 120      mild 5. RECURRENT SYMPTOM: "Have you had difficulty breathing before?" If Yes, ask: "When was the last time?" and "What happened that time?"       COPD hx, COVID 2 weeks ago 6. CARDIAC HISTORY: "Do you have any history of heart disease?" (e.g., heart attack, angina, bypass surgery, angioplasty)      Hx stroke 7. LUNG HISTORY: "Do you have any history of lung disease?"  (e.g., pulmonary embolus, asthma, emphysema)     COPD,COVID 8. CAUSE: "What do you think is causing the breathing problem?"      COVID congestion 9. OTHER SYMPTOMS: "Do you have any other symptoms? (e.g., dizziness, runny nose, cough, chest pain, fever)     Nasal congestion 10. PREGNANCY: "Is there any chance you are pregnant?" "When was your last menstrual period?"       n/a 11. TRAVEL: "Have you traveled out of the country in the last month?" (e.g., travel history, exposures)       COVID diagnosed 2 weeks ago  Answer Assessment - Initial Assessment Questions 1. COVID-19 ONSET: "When did the symptoms of COVID-19 first start?"     Diagnosed 1/27- whole family got COVID 2. DIAGNOSIS CONFIRMATION: "How were you diagnosed?" (e.g., COVID-19 oral or nasal viral test; COVID-19 antibody test; doctor visit)     Viral test at office 3. MAIN SYMPTOM:  "What is your main concern or symptom right now?" (e.g., breathing difficulty, cough, fatigue. loss of smell)     Cough with chest congestion, wheezing has started 4. SYMPTOM ONSET: "When did the  wheezing  start?"     1 week 5. BETTER-SAME-WORSE: "Are you getting better, staying the same, or getting worse over the last  1 to 2 weeks?"    Same- patient feeling better- back at work- but cough continues  6. RECENT MEDICAL VISIT: "Have you been seen by a healthcare provider (doctor, NP, PA) for these persisting COVID-19 symptoms?" If Yes, ask: "When were you seen?" (e.g., date)     1/27 7. COUGH: "Do you have a cough?" If Yes, ask: "How bad is the cough?"       Yes- constant 8. FEVER: "Do you have a fever?" If Yes, ask: "What is your temperature, how was it measured, and when did it start?"     No fever 9. BREATHING DIFFICULTY:  "Are you having any trouble breathing?" If Yes, ask: "How bad is your breathing?" (e.g., mild, moderate, severe)    - MILD: No SOB at rest, mild SOB with walking, speaks normally in sentences, can lie down, no retractions, pulse < 100.    - MODERATE: SOB at rest, SOB with minimal exertion and prefers to sit, cannot lie down flat, speaks in phrases, mild retractions, audible wheezing, pulse 100-120.    - SEVERE: Very SOB at rest, speaks in single words, struggling to breathe, sitting hunched forward, retractions, pulse > 120       Patient states not having breathing difficultly- but can get SOB if very active- she is back at work 10. HIGH RISK DISEASE: "Do you have any chronic medical problems?" (e.g., asthma, heart or lung disease, weak immune system, obesity, etc.)       COPD, heart disease  Protocols used: CORONAVIRUS (COVID-19) PERSISTING SYMPTOMS FOLLOW-UP CALL-A-AH, BREATHING DIFFICULTY-A-AH

## 2020-11-04 NOTE — Telephone Encounter (Signed)
Ok for in-office 

## 2020-11-04 NOTE — Telephone Encounter (Signed)
Pt asked for a medication to help get the stuff out of her chest to cough up / pt doesn't want to end up with pneumonia / pt stated the cough meds she was prescribed has not helped at all / pt is getting over covid / please advise

## 2020-11-04 NOTE — Telephone Encounter (Signed)
This has been requested in nurse triage as well

## 2020-11-04 NOTE — Telephone Encounter (Signed)
Pt stated she has now developed chills and feels like she may have a fever and has a little bit of trouble breathing now, Scheduled pt virtually per provider.

## 2020-11-06 ENCOUNTER — Telehealth (INDEPENDENT_AMBULATORY_CARE_PROVIDER_SITE_OTHER): Payer: 59 | Admitting: Family Medicine

## 2020-11-06 ENCOUNTER — Ambulatory Visit
Admission: RE | Admit: 2020-11-06 | Discharge: 2020-11-06 | Disposition: A | Discharge status: Home / Self Care | Payer: 59 | Attending: Family Medicine | Admitting: Family Medicine

## 2020-11-06 ENCOUNTER — Encounter: Payer: Self-pay | Admitting: Family Medicine

## 2020-11-06 ENCOUNTER — Other Ambulatory Visit: Payer: Self-pay

## 2020-11-06 ENCOUNTER — Ambulatory Visit
Admission: RE | Admit: 2020-11-06 | Discharge: 2020-11-06 | Disposition: A | Discharge status: Home / Self Care | Payer: 59 | Source: Ambulatory Visit | Attending: Family Medicine | Admitting: Family Medicine

## 2020-11-06 DIAGNOSIS — J1282 Pneumonia due to coronavirus disease 2019: Secondary | ICD-10-CM | POA: Diagnosis present

## 2020-11-06 DIAGNOSIS — U071 COVID-19: Secondary | ICD-10-CM | POA: Diagnosis present

## 2020-11-06 MED ORDER — AMOXICILLIN-POT CLAVULANATE 875-125 MG PO TABS: 1.0000 | ORAL_TABLET | Freq: Two times a day (BID) | ORAL | 0 refills | Status: DC

## 2020-11-06 MED ORDER — PREDNISONE 10 MG PO TABS: ORAL_TABLET | ORAL | 0 refills | Status: DC

## 2020-11-06 NOTE — Progress Notes (Signed)
There were no vitals taken for this visit.   Subjective:    Patient ID: Meagan York, female    DOB: 07-08-1956, 65 y.o.   MRN: [id number]  HPI: Meagan York is a 65 y.o. female  Chief Complaint  Patient presents with  . Cough    Pt states she had covid a month ago. States she is still coughing, having chest congestion, and feeling fatigued. States that the tessalon pearls do not work.    UPPER RESPIRATORY TRACT INFECTION Duration: for about the last month since she had COVID Worst symptom: cough, fatigue Fever: no, +chills and sweats Cough: yes Shortness of breath: yes Wheezing: yes Chest pain: yes, with cough Chest tightness: yes Chest congestion: yes Nasal congestion: no Runny nose: yes Post nasal drip: yes Sneezing: no Sore throat: no Swollen glands: no Sinus pressure: no Headache: yes Face pain: no Toothache: no Ear pain: no  Ear pressure: no  Eyes red/itching:no Eye drainage/crusting: no  Vomiting: no Rash: no Fatigue: yes Sick contacts: yes Strep contacts: no  Context: worse Recurrent sinusitis: no Relief with OTC cold/cough medications: no  Treatments attempted: inhalers, nyquil, mucinex, cough meds    Relevant past medical, surgical, family and social history reviewed and updated as indicated. Interim medical history since our last visit reviewed. Allergies and medications reviewed and updated.  Review of Systems  Constitutional: Positive for chills, diaphoresis and fatigue. Negative for activity change, appetite change, fever and unexpected weight change.  HENT: Positive for congestion, postnasal drip and rhinorrhea. Negative for dental problem, drooling, ear discharge, ear pain, facial swelling, hearing loss, mouth sores, nosebleeds, sinus pressure, sinus pain, sneezing, sore throat, tinnitus, trouble swallowing and voice change.   Eyes: Negative.   Respiratory: Positive for cough, shortness of breath and wheezing. Negative for apnea, choking,  chest tightness and stridor.   Cardiovascular: Negative.   Gastrointestinal: Negative.   Psychiatric/Behavioral: Negative.     Per HPI unless specifically indicated above     Objective:    There were no vitals taken for this visit.  Wt Readings from Last 3 Encounters:  08/18/20 193 lb 3.2 oz (87.6 kg)  08/05/20 191 lb (86.6 kg)  07/25/20 190 lb (86.2 kg)    Physical Exam Vitals and nursing note reviewed.  Pulmonary:     Effort: Pulmonary effort is normal. No respiratory distress.     Comments: Speaking in full sentences, coughing throughout Neurological:     Mental Status: She is alert.  Psychiatric:        Mood and Affect: Mood normal.        Behavior: Behavior normal.        Thought Content: Thought content normal.        Judgment: Judgment normal.     Results for orders placed or performed in visit on 10/09/20  Novel Coronavirus, NAA ([facility])   Specimen: Saline  Result Value Ref Range   SARS-CoV-2, NAA Detected (A) Not Detected  SARS-COV-2, NAA 2 DAY TAT  Result Value Ref Range   SARS-CoV-2, NAA 2 DAY TAT Performed       Assessment & Plan:   Problem List Items Addressed This Visit   None   Visit Diagnoses    Pneumonia due to COVID-19 virus    -  Primary   WIll treat with prednisone and augmentin. Checking CXR. Await results. Treat as needed. Recheck 1 week in person.    Relevant Medications   amoxicillin-clavulanate (AUGMENTIN) 875-125 MG tablet   Other  Relevant Orders   DG Chest 2 View       Follow up plan: Return in about 1 week (around 11/13/2020) for in person.   . This visit was completed via telephone due to the restrictions of the COVID-19 pandemic. All issues as above were discussed and addressed but no physical exam was performed. If it was felt that the patient should be evaluated in the office, they were directed there. The patient verbally consented to this visit. Patient was unable to complete an audio/visual visit due to Lack of equipment.  Due to the catastrophic nature of the COVID-19 pandemic, this visit was done through audio contact only. . Location of the patient: home . Location of the provider: work . Those involved with this call:  . Provider: Park Liter, DO . CMA: Louanna Raw, [place] . Front Desk/Registration: Jill Side  . Time spent on call: 21 minutes on the phone discussing health concerns. 30 minutes total spent in review of patient's record and preparation of their chart.

## 2020-11-07 ENCOUNTER — Telehealth: Payer: Self-pay

## 2020-11-07 ENCOUNTER — Telehealth: Payer: 59 | Admitting: Family Medicine

## 2020-11-07 NOTE — Telephone Encounter (Signed)
Routing to provider for when results are available. Not reviewed by a radiologist yet.

## 2020-11-07 NOTE — Telephone Encounter (Signed)
Pt called to follow up on the results, stated she would like to be called once they are in. Pt states she is still unable to use her MyChart.

## 2020-11-07 NOTE — Telephone Encounter (Signed)
Copied from [place] [id number]. Topic: General - Other >> Nov 07, 2020  1:32 PM Tessa Lerner A wrote: Reason for CRM: Patient would like to be notified of Xray results when possible Patient had an imaging procedure on 11/06/20 Patient is having difficulty with MyChart and would prefer contacted by phone Please contact to advise

## 2020-11-10 ENCOUNTER — Telehealth: Payer: Self-pay

## 2020-11-10 NOTE — Telephone Encounter (Signed)
Please advise, pt seen 11/06/20  Copied from [place] #[id number]. Topic: General - Other >> Nov 10, 2020 10:17 AM Celene Kras wrote: Reason for CRM: Pt called and is requesting to have a note stating that she is able to go back to work today. She states that she needs to come into the office around 1:45-2 pm today to pick that up. Please advise.

## 2020-11-10 NOTE — Telephone Encounter (Signed)
Result viewed on Mychart by the patient. Called and LVM with result as well.

## 2020-11-14 ENCOUNTER — Ambulatory Visit: Payer: 59 | Admitting: Family Medicine

## 2020-11-14 ENCOUNTER — Encounter: Payer: Self-pay | Admitting: Family Medicine

## 2020-11-14 NOTE — Telephone Encounter (Signed)
Done

## 2020-11-24 ENCOUNTER — Other Ambulatory Visit: Payer: Self-pay | Admitting: Family Medicine

## 2020-12-01 ENCOUNTER — Other Ambulatory Visit: Payer: Self-pay

## 2020-12-01 ENCOUNTER — Encounter: Payer: Self-pay | Admitting: Family Medicine

## 2020-12-01 ENCOUNTER — Ambulatory Visit: Payer: 59 | Admitting: Family Medicine

## 2020-12-01 VITALS — BP 135/90 | HR 91 | Temp 97.8°F | Wt 201.2 lb

## 2020-12-01 DIAGNOSIS — J301 Allergic rhinitis due to pollen: Secondary | ICD-10-CM

## 2020-12-01 MED ORDER — FLUTICASONE PROPIONATE 50 MCG/ACT NA SUSP: 2.0000 | Freq: Every day | NASAL | 6 refills | Status: DC

## 2020-12-01 MED ORDER — LEVOCETIRIZINE DIHYDROCHLORIDE 5 MG PO TABS: 5.0000 mg | ORAL_TABLET | Freq: Every evening | ORAL | 3 refills | Status: DC

## 2020-12-01 NOTE — Progress Notes (Signed)
BP 135/90   Pulse 91   Temp 97.8 F (36.6 C)   Wt 201 lb 3.2 oz (91.3 kg)   SpO2 98%   BMI 34.54 kg/m    Subjective:    Patient ID: Meagan York, female    DOB: 1956-03-20, 65 y.o.   MRN: [id number]  HPI: Meagan York is a 64 y.o. female  Chief Complaint  Patient presents with  . Cough    Patient states she is still having a feeling in her throat like there is something in there that is itchy. Patient states she is wheezing.    Did not come in after her last appointment due to sons having COVID. She feels like she is still wheezing in her throat. She has finished her medicine. Still using her inhalers. No fevers. No chills. Otherwise feeling well.   Relevant past medical, surgical, family and social history reviewed and updated as indicated. Interim medical history since our last visit reviewed. Allergies and medications reviewed and updated.  Review of Systems  HENT: Positive for congestion. Negative for dental problem, drooling, ear discharge, ear pain, facial swelling, hearing loss, mouth sores, nosebleeds, postnasal drip, rhinorrhea, sinus pressure, sinus pain, sneezing, sore throat, tinnitus, trouble swallowing and voice change.   Respiratory: Positive for cough and wheezing. Negative for apnea, choking, chest tightness, shortness of breath and stridor.   Cardiovascular: Negative.   Gastrointestinal: Negative.   Psychiatric/Behavioral: Negative.     Per HPI unless specifically indicated above     Objective:    BP 135/90   Pulse 91   Temp 97.8 F (36.6 C)   Wt 201 lb 3.2 oz (91.3 kg)   SpO2 98%   BMI 34.54 kg/m   Wt Readings from Last 3 Encounters:  12/01/20 201 lb 3.2 oz (91.3 kg)  08/18/20 193 lb 3.2 oz (87.6 kg)  08/05/20 191 lb (86.6 kg)    Physical Exam Vitals and nursing note reviewed.  Constitutional:      General: She is not in acute distress.    Appearance: Normal appearance. She is not ill-appearing, toxic-appearing or diaphoretic.  HENT:      Head: Normocephalic and atraumatic.     Right Ear: External ear normal.     Left Ear: External ear normal.     Nose: Nose normal.     Mouth/Throat:     Mouth: Mucous membranes are moist.     Pharynx: Oropharynx is clear.  Eyes:     General: No scleral icterus.       Right eye: No discharge.        Left eye: No discharge.     Extraocular Movements: Extraocular movements intact.     Conjunctiva/sclera: Conjunctivae normal.     Pupils: Pupils are equal, round, and reactive to light.  Cardiovascular:     Rate and Rhythm: Normal rate and regular rhythm.     Pulses: Normal pulses.     Heart sounds: Normal heart sounds. No murmur heard. No friction rub. No gallop.   Pulmonary:     Effort: Pulmonary effort is normal. No respiratory distress.     Breath sounds: Normal breath sounds. Stridor present. No wheezing, rhonchi or rales.  Chest:     Chest wall: No tenderness.  Musculoskeletal:        General: Normal range of motion.     Cervical back: Normal range of motion and neck supple.  Skin:    General: Skin is warm and dry.  Capillary Refill: Capillary refill takes less than 2 seconds.     Coloration: Skin is not jaundiced or pale.     Findings: No bruising, erythema, lesion or rash.  Neurological:     General: No focal deficit present.     Mental Status: She is alert and oriented to person, place, and time. Mental status is at baseline.  Psychiatric:        Mood and Affect: Mood normal.        Behavior: Behavior normal.        Thought Content: Thought content normal.        Judgment: Judgment normal.     Results for orders placed or performed in visit on 10/09/20  Novel Coronavirus, NAA ([facility])   Specimen: Saline  Result Value Ref Range   SARS-CoV-2, NAA Detected (A) Not Detected  SARS-COV-2, NAA 2 DAY TAT  Result Value Ref Range   SARS-CoV-2, NAA 2 DAY TAT Performed       Assessment & Plan:   Problem List Items Addressed This Visit   None   Visit Diagnoses     Seasonal allergic rhinitis due to pollen    -  Primary   Will treat with xyzal and flonase. Call if not getting better or getting worse. Continue to monitor.        Follow up plan: Return May for 6 month follow up.

## 2020-12-04 ENCOUNTER — Ambulatory Visit (INDEPENDENT_AMBULATORY_CARE_PROVIDER_SITE_OTHER): Payer: 59 | Admitting: Family Medicine

## 2020-12-04 ENCOUNTER — Ambulatory Visit
Admission: RE | Admit: 2020-12-04 | Discharge: 2020-12-04 | Disposition: A | Discharge status: Home / Self Care | Payer: 59 | Source: Home / Self Care | Attending: Family Medicine | Admitting: Family Medicine

## 2020-12-04 ENCOUNTER — Ambulatory Visit: Payer: Self-pay | Admitting: *Deleted

## 2020-12-04 ENCOUNTER — Ambulatory Visit
Admission: RE | Admit: 2020-12-04 | Discharge: 2020-12-04 | Disposition: A | Discharge status: Home / Self Care | Payer: 59 | Source: Ambulatory Visit | Attending: Family Medicine | Admitting: Family Medicine

## 2020-12-04 ENCOUNTER — Telehealth: Payer: Self-pay | Admitting: Family Medicine

## 2020-12-04 ENCOUNTER — Encounter: Payer: Self-pay | Admitting: Family Medicine

## 2020-12-04 ENCOUNTER — Other Ambulatory Visit: Payer: Self-pay

## 2020-12-04 DIAGNOSIS — J440 Chronic obstructive pulmonary disease with acute lower respiratory infection: Secondary | ICD-10-CM

## 2020-12-04 DIAGNOSIS — J209 Acute bronchitis, unspecified: Secondary | ICD-10-CM

## 2020-12-04 DIAGNOSIS — J301 Allergic rhinitis due to pollen: Secondary | ICD-10-CM | POA: Diagnosis not present

## 2020-12-04 MED ORDER — PREDNISONE 50 MG PO TABS: 50.0000 mg | ORAL_TABLET | Freq: Every day | ORAL | 0 refills | Status: DC

## 2020-12-04 NOTE — Telephone Encounter (Signed)
Patient requesting to get medications changed due to cost . Patient continues to report cough, runny nose, eyes hurting, lightheadedness when standing and turning head, nausea. Breathing difficulties reported and requires using inhaler more often. Patient awaiting virtual visit at 1:00. Please advise. Instructed patient to call back if symptoms worsen or go to UC or ED.

## 2020-12-04 NOTE — Progress Notes (Signed)
There were no vitals taken for this visit.   Subjective:    Patient ID: Meagan York, female    DOB: August 31, 1956, 65 y.o.   MRN: [id number]  HPI: Meagan York is a 65 y.o. female  Chief Complaint  Patient presents with  . Cough    Pt states she is still coughing and wheezing, states her head hurts and feel nauseous. Was seen on 12/01/20.    Has been taking her xyzal and her flonase. Feeling worse. She notes that she has been coughing and her head hurts and she has been nauseous. No fevers. No chills. + SOB, + wheezing. She has been using her inhaler a bit more. She notes that she is otherwise doing OK. No other concerns or complaints at this time.  Relevant past medical, surgical, family and social history reviewed and updated as indicated. Interim medical history since our last visit reviewed. Allergies and medications reviewed and updated.  Review of Systems  Constitutional: Negative.   HENT: Positive for congestion and rhinorrhea. Negative for dental problem, drooling, ear discharge, ear pain, facial swelling, hearing loss, mouth sores, nosebleeds, postnasal drip, sinus pressure, sinus pain, sneezing, sore throat, tinnitus, trouble swallowing and voice change.   Respiratory: Positive for cough, chest tightness, shortness of breath and wheezing. Negative for apnea, choking and stridor.   Cardiovascular: Negative.   Gastrointestinal: Negative.   Musculoskeletal: Negative.   Neurological: Negative.   Psychiatric/Behavioral: Negative.     Per HPI unless specifically indicated above     Objective:    There were no vitals taken for this visit.  Wt Readings from Last 3 Encounters:  12/01/20 201 lb 3.2 oz (91.3 kg)  08/18/20 193 lb 3.2 oz (87.6 kg)  08/05/20 191 lb (86.6 kg)    Physical Exam Vitals and nursing note reviewed.  Pulmonary:     Effort: Pulmonary effort is normal. No respiratory distress.     Comments: Speaking in full sentences Neurological:     Mental  Status: She is alert.  Psychiatric:        Mood and Affect: Mood normal.        Behavior: Behavior normal.        Thought Content: Thought content normal.        Judgment: Judgment normal.     Results for orders placed or performed in visit on 10/09/20  Novel Coronavirus, NAA ([facility])   Specimen: Saline  Result Value Ref Range   SARS-CoV-2, NAA Detected (A) Not Detected  SARS-COV-2, NAA 2 DAY TAT  Result Value Ref Range   SARS-CoV-2, NAA 2 DAY TAT Performed       Assessment & Plan:   Problem List Items Addressed This Visit   None   Visit Diagnoses    Seasonal allergic rhinitis due to pollen    -  Primary   Worsening. Continue xyzal and flonase. Will treat with steroid burst. Call if not getting better or getting worse.    Acute bronchitis with COPD ([place])       Will treat with steroid burst and check CXR. Await results. Treat as needed.    Relevant Medications   predniSONE (DELTASONE) 50 MG tablet   Other Relevant Orders   DG Chest 2 View       Follow up plan: Return in about 1 week (around 12/11/2020).   . This visit was completed via telephone due to the restrictions of the COVID-19 pandemic. All issues as above were discussed and addressed but  no physical exam was performed. If it was felt that the patient should be evaluated in the office, they were directed there. The patient verbally consented to this visit. Patient was unable to complete an audio/visual visit due to Lack of equipment. Due to the catastrophic nature of the COVID-19 pandemic, this visit was done through audio contact only. . Location of the patient: home . Location of the provider: work . Those involved with this call:  . Provider: Park Liter, DO . CMA: Louanna Raw, [place] . Front Desk/Registration: Jill Side  . Time spent on call: 21 minutes on the phone discussing health concerns. 30 minutes total spent in review of patient's record and preparation of their chart.

## 2020-12-04 NOTE — Telephone Encounter (Signed)
Patient was last seen by PCP on 12/01/2020 and states symptoms have not improved and the medication prescribed is not working. Patient currently experiencing nausea, deep cough and head is clogged up and medication prescribed is not working. Patient states she is feeling lightheadedness therefore triage to Nurse Triage. Patient was scheduled for virtual appointment at 1pm due to time being sensitive. patient would like a phone call best # [phone]. Patient also asking for a Dr. Note excusing her from work today.

## 2020-12-05 ENCOUNTER — Telehealth: Payer: Self-pay

## 2020-12-05 NOTE — Telephone Encounter (Signed)
lvm to make this apt.  

## 2020-12-05 NOTE — Telephone Encounter (Signed)
-----   Message from Valerie Roys, DO sent at 12/04/2020  1:34 PM EDT ----- 1 week in person

## 2020-12-08 ENCOUNTER — Telehealth: Payer: Self-pay

## 2020-12-08 ENCOUNTER — Telehealth: Payer: Self-pay | Admitting: *Deleted

## 2020-12-08 ENCOUNTER — Encounter: Payer: Self-pay | Admitting: Family Medicine

## 2020-12-08 NOTE — Telephone Encounter (Signed)
Copied from [place] [id number] [id number]. Topic: General - Other >> Dec 08, 2020  1:05 PM Meagan York wrote: Reason for CRM: Pt would like a call to go over her Xray results/ Pt also stated she was coming in office this afternoon to pick up a letter for work/ Pt asked if the nurse could call her before she goes to work at 2:30pm/ please advise

## 2020-12-08 NOTE — Telephone Encounter (Signed)
Pt was called VM was left that her letter is ready for pickup. Placed in File drawer

## 2020-12-08 NOTE — Telephone Encounter (Signed)
Patient requesting a Doctor note excusing her from work on Thursday 12/04/2020 and Friday 12/05/2020 and would like to pick up note today. Patient did state if PCP can write her out for all last week from  12/01/2020 that would appreciated as well. Also patient is inquiring about her most imaging results and unable to log into MyChart

## 2020-12-08 NOTE — Telephone Encounter (Signed)
Pt came in office today advised of Tiffany's message

## 2020-12-08 NOTE — Telephone Encounter (Signed)
Letter on her chart. Most recent CXR was normal

## 2020-12-08 NOTE — Telephone Encounter (Signed)
Called and left a message letting patient know that her xray was normal.

## 2020-12-08 NOTE — Telephone Encounter (Signed)
LVM letting patient know that letter is ready for pickup.

## 2020-12-08 NOTE — Telephone Encounter (Signed)
Please advise pt seen 3/21 and 3/24

## 2021-01-16 ENCOUNTER — Other Ambulatory Visit: Payer: Self-pay | Admitting: Family Medicine

## 2021-01-30 ENCOUNTER — Ambulatory Visit: Payer: 59 | Admitting: Family Medicine

## 2021-02-06 ENCOUNTER — Other Ambulatory Visit: Payer: Self-pay

## 2021-02-06 ENCOUNTER — Encounter: Payer: Self-pay | Admitting: Emergency Medicine

## 2021-02-06 ENCOUNTER — Ambulatory Visit
Admission: EM | Admit: 2021-02-06 | Discharge: 2021-02-06 | Disposition: A | Discharge status: Home / Self Care | Payer: Worker's Compensation | Attending: Sports Medicine | Admitting: Sports Medicine

## 2021-02-06 DIAGNOSIS — S43422A Sprain of left rotator cuff capsule, initial encounter: Secondary | ICD-10-CM

## 2021-02-06 DIAGNOSIS — M25612 Stiffness of left shoulder, not elsewhere classified: Secondary | ICD-10-CM

## 2021-02-06 DIAGNOSIS — S4992XA Unspecified injury of left shoulder and upper arm, initial encounter: Secondary | ICD-10-CM | POA: Diagnosis not present

## 2021-02-06 DIAGNOSIS — M25512 Pain in left shoulder: Secondary | ICD-10-CM

## 2021-02-06 MED ORDER — PREDNISONE 10 MG (21) PO TBPK: ORAL_TABLET | Freq: Every day | ORAL | 0 refills | Status: DC

## 2021-02-06 NOTE — ED Provider Notes (Signed)
[facility]    CSN: [id number] Arrival date & time: 02/06/21  1742      History   Chief Complaint Chief Complaint  Patient presents with  . Shoulder Pain    left  . Worker's Comp Injury    HPI Meagan York is a 65 y.o. female.   65 year old left-hand-dominant female presents for evaluation of an injury to her left shoulder.  Date of injury was just prior to arrival.  02/06/2021.  This is a workers comp case.  She came after hours and could not be seen in the occupational health department.  She works at [employer] and she runs a [employer].  She reports that around 3:30 PM she went to dump some trash and felt a pop in her left shoulder.  She reported to her supervisor and they advised her to go to the walk-in clinic.  She denies any prior injuries to her shoulder.  No injuries in the past.  She is complaining of pain with movement, decreased range of motion, and stiffness.  She denies any numbness or tingling or neck pain.  She does have a history of COPD, osteoarthritis, and hypertension.  No other issues or problems are offered.  No red flag signs or symptoms elicited on history.     Past Medical History:  Diagnosis Date  . Arthritis   . Asthma   . Bilateral breast cysts   . 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 Complication of anesthesia    pt woke up during breast surgery (1995)  . COPD (chronic obstructive pulmonary disease) ([place])   . Emphysema lung ([place])   . GERD (gastroesophageal reflux disease)   . Headache    sinus  . Hemorrhoids   . Hypertension   . Irritable bowel disease   . 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 Osteoarthritis of spine with radiculopathy, lumbar region 08/18/2020  . Acute midline low back pain without sciatica 08/05/2020  . B12 deficiency 07/28/2020  . History of stroke 03/06/2020   . Hyperlipidemia 11/12/2019  . Depression 11/12/2019  . Right arm weakness 11/02/2019  . Pap smear abnormality of cervix/human papillomavirus (HPV) positive 07/03/2018  . Class 1 obesity due to excess calories with serious comorbidity and body mass index (BMI) of 33.0 to 33.9 in adult 04/20/2018  . 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 Tobacco abuse 11/24/2015  . Essential (primary) hypertension 11/24/2015  . Heel pain, bilateral 10/24/2015  . Pain of right foot 10/24/2015  . Arthritis   . COPD (chronic obstructive pulmonary disease) ([place])   . GERD (gastroesophageal reflux disease)   . Upper GI bleed 07/05/2015  . GI bleed due to NSAIDs 06/14/2015  . History of breast cancer 04/25/2015  . Primary malignant neoplasm ([place]) 04/25/2015    Past Surgical History:  Procedure Laterality Date  . ACHILLES TENDON SURGERY Right 06/07/2019   Procedure: ACHILLES REPAIR SECONDARY RIGHT;  Surgeon: Albertine Patricia, DPM;  Location: [facility];  Service: Podiatry;  Laterality: Right;  general with local  . APPENDECTOMY    . BREAST BIOPSY Right 01-01-15   BIPHASIC FIBROEPITHELIAL LESION with ADH  . BREAST LUMPECTOMY Right 04/25/2015   Procedure: RIGHT BREAST LUMPECTOMY, exicision of skin tag right face;  Surgeon: Christene Lye, MD;  Location: [facility];  Service: General;  Laterality: Right;  .  BREAST MAMMOSITE Right 05-21-15  . BREAST SURGERY Left    cyst removal  . CALCANEAL OSTEOTOMY Right 06/07/2019   Procedure: PARTIAL CALCANECTOMY;  Surgeon: Albertine Patricia, DPM;  Location: [facility];  Service: Podiatry;  Laterality: Right;  . COLONOSCOPY WITH PROPOFOL N/A 07/07/2015   Procedure: COLONOSCOPY WITH PROPOFOL;  Surgeon: Josefine Class, MD;  Location: [facility];  Service: Endoscopy;  Laterality: N/A;  . COLONOSCOPY WITH PROPOFOL N/A 06/12/2018   Procedure: COLONOSCOPY WITH PROPOFOL;  Surgeon: Lin Landsman, MD;  Location: [facility];  Service:  Gastroenterology;  Laterality: N/A;  . cyst lower spine    . ESOPHAGOGASTRODUODENOSCOPY (EGD) WITH PROPOFOL N/A 07/07/2015   Procedure: ESOPHAGOGASTRODUODENOSCOPY (EGD) WITH PROPOFOL;  Surgeon: Josefine Class, MD;  Location: [facility]COPY;  Service: Endoscopy;  Laterality: N/A;  . ESOPHAGOGASTRODUODENOSCOPY (EGD) WITH PROPOFOL N/A 06/12/2018   Procedure: ESOPHAGOGASTRODUODENOSCOPY (EGD) WITH PROPOFOL;  Surgeon: Lin Landsman, MD;  Location: [facility] ENDOSCOPY;  Service: Gastroenterology;  Laterality: N/A;  . KNEE SURGERY    . piondial cyst excision  03/1989    OB History    Gravida  2   Para  2   Term  2   Preterm      AB      Living  2     SAB      IAB      Ectopic      Multiple      Live Births           Obstetric Comments  Menstrual age: 42  Age 1st Pregnancy: 89          Home Medications    Prior to Admission medications   Medication Sig Start Date End Date Taking? Authorizing Provider  amLODipine (NORVASC) 5 MG tablet TAKE 1 TABLET BY MOUTH EVERY DAY 11/24/20  Yes Johnson, Megan P, DO  atorvastatin (LIPITOR) 40 MG tablet TAKE 1 TABLET BY MOUTH DAILY AT 6 PM. 01/16/21  Yes Johnson, Megan P, DO  budesonide-formoterol (SYMBICORT) 160-4.5 MCG/ACT inhaler Inhale 2 puffs into the lungs 2 (two) times daily. 03/06/20  Yes Johnson, Megan P, DO  buPROPion (WELLBUTRIN XL) 300 MG 24 hr tablet Take 1 tablet (300 mg total) by mouth daily. 03/06/20  Yes Johnson, Megan P, DO  celecoxib (CELEBREX) 100 MG capsule Take 1 capsule (100 mg total) by mouth 2 (two) times daily. 07/25/20  Yes Johnson, Megan P, DO  Cholecalciferol (VITAMIN D3) 1000 units CAPS Take 1,000 Units by mouth daily.   Yes [provider]  clopidogrel (PLAVIX) 75 MG tablet Take 1 tablet (75 mg total) by mouth daily. 03/06/20  Yes Johnson, Megan P, DO  fluticasone (FLONASE) 50 MCG/ACT nasal spray Place 2 sprays into both nostrils daily. 12/01/20  Yes Johnson, Megan P, DO  gabapentin (NEURONTIN) 100  MG capsule Take 1 capsule (100 mg total) by mouth 3 (three) times daily. 08/18/20  Yes Johnson, Megan P, DO  omeprazole (PRILOSEC) 40 MG capsule Take 1 capsule (40 mg total) by mouth daily. 03/06/20  Yes Johnson, Megan P, DO  predniSONE (STERAPRED UNI-PAK 21 TAB) 10 MG (21) TBPK tablet Take by mouth daily. Take 6 tabs by mouth daily  for 2 days, then 5 tabs for 2 days, then 4 tabs for 2 days, then 3 tabs for 2 days, 2 tabs for 2 days, then 1 tab by mouth daily for 2 days 02/06/21  Yes Verda Cumins, MD  telmisartan (MICARDIS) 80 MG tablet Take 1 tablet (80 mg total)  by mouth daily. 03/06/20  Yes Johnson, Megan P, DO  traZODone (DESYREL) 50 MG tablet Take 1-2 tablets (50-100 mg total) by mouth at bedtime as needed for sleep. 03/06/20  Yes Johnson, Megan P, DO  albuterol (PROAIR HFA) 108 (90 Base) MCG/ACT inhaler Inhale 1-2 puffs into the lungs every 4 (four) hours as needed for wheezing or shortness of breath. 03/06/20   Wynetta Emery, Megan P, DO  levocetirizine (XYZAL) 5 MG tablet Take 1 tablet (5 mg total) by mouth every evening. 12/01/20   Johnson, Megan P, DO  ondansetron (ZOFRAN ODT) 4 MG disintegrating tablet Take 1 tablet (4 mg total) by mouth every 8 (eight) hours as needed for nausea or vomiting. 12/28/19   Valerie Roys, DO  Spacer/Aero-Holding Chambers DEVI 1 each by Does not apply route daily. 09/07/18   Tyler Pita, MD  Tiotropium Bromide Monohydrate (SPIRIVA RESPIMAT) 2.5 MCG/ACT AERS Inhale 1 puff into the lungs daily. 11/12/19   Volney American, PA-C    Family History Family History  Problem Relation Age of Onset  . Colon cancer Father        age 56  . Anemia Father   . Diabetes Father   . Hypertension Father   . Diverticulosis Mother   . COPD Mother   . Hypertension Brother   . Alzheimer's disease Paternal Grandfather     Social History Social History   Tobacco Use  . Smoking status: Former Smoker    Packs/day: 0.25    Years: 44.00    Pack years: 11.00    Types:  Cigarettes  . Smokeless tobacco: Never Used  Vaping Use  . Vaping Use: Never used  Substance Use Topics  . Alcohol use: No    Alcohol/week: 0.0 standard drinks  . Drug use: No     Allergies   Acetaminophen, Aspirin, Oxycodone-acetaminophen, and Tramadol   Review of Systems Review of Systems  Constitutional: Positive for activity change. Negative for appetite change, chills, diaphoresis, fatigue and fever.  HENT: Negative for congestion, ear pain, postnasal drip, rhinorrhea, sinus pressure, sinus pain, sneezing and sore throat.   Eyes: Negative for pain.  Respiratory: Negative for cough, chest tightness and shortness of breath.   Cardiovascular: Negative for chest pain and palpitations.  Gastrointestinal: Negative for abdominal pain, diarrhea, nausea and vomiting.  Genitourinary: Negative for dysuria.  Musculoskeletal: Positive for arthralgias. Negative for back pain, joint swelling, myalgias, neck pain and neck stiffness.  Skin: Negative for color change, pallor, rash and wound.  Neurological: Negative for dizziness, light-headedness, numbness and headaches.  All other systems reviewed and are negative.    Physical Exam Triage Vital Signs ED Triage Vitals  Enc Vitals Group     BP --      Pulse --      Resp --      Temp --      Temp src --      SpO2 --      Weight 02/06/21 1834 200 lb (90.7 kg)     Height 02/06/21 1834 5\' 4"  (1.626 m)     Head Circumference --      Peak Flow --      Pain Score 02/06/21 1833 8     Pain Loc --      Pain Edu? --      Excl. in [place]? --    No data found.  Updated Vital Signs BP 113/76 (BP Location: Right Arm)   Pulse 86   Temp 98.2 F (  36.8 C) (Oral)   Resp 15   Ht 5\' 4"  (1.626 m)   Wt 90.7 kg   SpO2 100%   BMI 34.33 kg/m   Visual Acuity Right Eye Distance:   Left Eye Distance:   Bilateral Distance:    Right Eye Near:   Left Eye Near:    Bilateral Near:     Physical Exam Vitals and nursing note reviewed.   Constitutional:      General: She is not in acute distress.    Appearance: Normal appearance. She is not ill-appearing, toxic-appearing or diaphoretic.     Comments: She is uncomfortable appearing due to the pain.  HENT:     Head: Normocephalic and atraumatic.     Nose: Nose normal.     Mouth/Throat:     Mouth: Mucous membranes are moist.  Eyes:     General: No scleral icterus.       Right eye: No discharge.        Left eye: No discharge.     Extraocular Movements: Extraocular movements intact.     Conjunctiva/sclera: Conjunctivae normal.     Pupils: Pupils are equal, round, and reactive to light.  Cardiovascular:     Rate and Rhythm: Normal rate and regular rhythm.     Pulses: Normal pulses.     Heart sounds: Normal heart sounds. No murmur heard. No friction rub. No gallop.   Pulmonary:     Effort: Pulmonary effort is normal.     Breath sounds: Normal breath sounds. No stridor. No wheezing, rhonchi or rales.  Musculoskeletal:     Right shoulder: Normal.     Cervical back: Normal range of motion and neck supple. No rigidity.     Comments: Right shoulder has normal range of motion and strength.  No focal findings.  Left shoulder: No obvious bony abnormality ecchymosis erythema soft tissue swelling.  Is a very limited exam secondary to her pain.  She has decreased range of motion in all planes.  She can get about 90 degrees of abduction and forward flexion.  Internal rotation to about the iliac crest.  She can put her arm behind her head so limitations in external rotation as well.  Strength testing is very limited but I do not appreciate any significant weakness.  No large rotator cuff weakness.  Due to the patient's discomfort I did not push the physical exam.  Cervical spine: She has good range of motion.  Spurling's test is negative.  No evidence of any cervical radiculopathy.  Skin:    General: Skin is warm and dry.     Capillary Refill: Capillary refill takes less than 2  seconds.     Findings: No bruising, erythema, lesion or rash.  Neurological:     General: No focal deficit present.     Mental Status: She is alert and oriented to person, place, and time.      UC Treatments / Results  Labs (all labs ordered are listed, but only abnormal results are displayed) Labs Reviewed - No data to display  EKG   Radiology No results found.  Procedures Procedures (including critical care time)  Medications Ordered in UC Medications - No data to display  Initial Impression / Assessment and Plan / UC Course  I have reviewed the triage vital signs and the nursing notes.  Pertinent labs & imaging results that were available during my care of the patient were reviewed by me and considered in my medical decision making (see  chart for details).  Clinical impression: Left shoulder injury with what appears to be a strain of the rotator cuff capsule.  Limited examination with decreased range of motion.  That said I do not appreciate any significant weakness on the limited exam that I am able to perform.  Treatment plan: 1.  The findings and treatment plan were discussed in detail with the patient.  Patient was in agreement. 2.  As our occupational health office was closed we will go ahead and treat her and have her follow-up with occupational health when they open up on Tuesday, May 31. 3.  She has multiple allergies and cannot take narcotics, tramadol, or acetaminophen.  She has a GI bleed issue in the past and has been told not to take NSAIDs.  This is left me very little in terms of treating her acute pain.  I did give her a prednisone taper. 4.  I completed the Worker's Comp. paperwork. 5.  Indicated to her if her symptoms were to worsen in any way over the weekend or pain was not well controlled then she needed to seek out care in the emergency room as there is little more that we can offer here in the urgent care.  She voiced verbal understanding. 6.   Educational handouts were provided. 7.  Of note no drug screen was requested and we did not have the ability to do that here in the urgent care. 8.  She was discharged from care and she will follow-up with occupational health when they reopen after the long weekend.  She was stable on discharge.  Greater than 45 minutes was spent reviewing the patient's chart, assessing the patient, doing a thorough review of systems and physical exam, completing the detailed Worker's Comp. paperwork, coming up with a treatment plan, reviewing her allergies and prescribing medications, and discussing her presentation with her in detail.  All questions were encouraged and answered.  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      Final Clinical Impressions(s) / UC Diagnoses   Final diagnoses:  Sprain of left rotator cuff capsule, initial encounter  Acute pain of left shoulder  Injury of left shoulder, initial encounter  Stiff shoulder, left     Discharge Instructions     As we discussed, you have a left shoulder strain.  Cannot fully rule out a rotator cuff tear, but seems consistent with just a strain of the rotator cuff capsule. Since you have an acetaminophen allergy and you have a history of GI bleeds, I am going to give you a prednisone taper.  No narcotics. I completed the Worker's Comp. paperwork.  Since it is a long weekend, and you report that your workplace will be closed, you should follow-up at occupational health on Tuesday, May 31 at [facility] for reevaluation and see if the medication has helped your situation. If your situation does get worse over the weekend please go to the emergency room.    ED Prescriptions    Medication Sig Dispense Auth. Provider   predniSONE (STERAPRED UNI-PAK 21 TAB) 10 MG (21) TBPK tablet Take by mouth daily. Take 6 tabs by mouth daily  for 2 days, then 5  tabs for 2 days, then 4 tabs for 2 days, then 3 tabs for 2 days, 2 tabs for 2 days, then 1 tab by mouth daily for 2 days 42 tablet Drema Dallas,  Chrissie Noa, MD     PDMP not reviewed this encounter.   Verda Cumins, MD 02/07/21 [phone]

## 2021-02-06 NOTE — ED Triage Notes (Signed)
Patient states that she picked up a bin at work and felt a pop in her left shoulder today.  Patient states that this is a worker's comp injury.

## 2021-02-06 NOTE — Discharge Instructions (Signed)
As we discussed, you have a left shoulder strain.  Cannot fully rule out a rotator cuff tear, but seems consistent with just a strain of the rotator cuff capsule. Since you have an acetaminophen allergy and you have a history of GI bleeds, I am going to give you a prednisone taper.  No narcotics. I completed the Worker's Comp. paperwork.  Since it is a long weekend, and you report that your workplace will be closed, you should follow-up at occupational health on Tuesday, May 31 at [facility] for reevaluation and see if the medication has helped your situation. If your situation does get worse over the weekend please go to the emergency room.

## 2021-02-07 ENCOUNTER — Other Ambulatory Visit: Payer: Self-pay | Admitting: Family Medicine

## 2021-02-07 NOTE — Telephone Encounter (Signed)
Requested medication (s) are due for refill today: yes  Requested medication (s) are on the active medication list: yes  Last refill:  03/06/20 #90 1 RF  Future visit scheduled: no  Notes to clinic:  overdue lab work   Requested Prescriptions  Pending Prescriptions Disp Refills   telmisartan ([place]) 80 MG tablet [Pharmacy Med Name: TELMISARTAN 80 MG TABLET] 30 tablet 5    Sig: TAKE 1 TABLET BY MOUTH EVERY DAY      Cardiovascular:  Angiotensin Receptor Blockers Failed - 02/07/2021  8:41 AM      Failed - Cr in normal range and within 180 days    Creatinine  Date Value Ref Range Status  06/19/2014 0.73 0.60 - 1.30 mg/dL Final   Creatinine, Ser  Date Value Ref Range Status  07/25/2020 0.78 0.57 - 1.00 mg/dL Final          Failed - K in normal range and within 180 days    Potassium  Date Value Ref Range Status  07/25/2020 3.6 3.5 - 5.2 mmol/L Final  06/19/2014 3.1 (L) 3.5 - 5.1 mmol/L Final          Passed - Patient is not pregnant      Passed - Last BP in normal range    BP Readings from Last 1 Encounters:  02/06/21 113/76          Passed - Valid encounter within last 6 months    Recent Outpatient Visits           2 months ago Seasonal allergic rhinitis due to pollen   [facility], Megan P, DO   2 months ago Seasonal allergic rhinitis due to pollen   [facility], Megan P, DO   3 months ago Pneumonia due to COVID-19 virus   [facility], Megan P, DO   4 months ago Close exposure to COVID-19 virus   [place], DO   5 months ago Osteoarthritis of spine with radiculopathy, lumbar region   [facility], [place], DO

## 2021-02-09 ENCOUNTER — Emergency Department
Admission: EM | Admit: 2021-02-09 | Discharge: 2021-02-09 | Disposition: A | Discharge status: Home / Self Care | Payer: 59 | Attending: Emergency Medicine | Admitting: Emergency Medicine

## 2021-02-09 ENCOUNTER — Emergency Department: Payer: 59

## 2021-02-09 ENCOUNTER — Other Ambulatory Visit: Payer: Self-pay

## 2021-02-09 DIAGNOSIS — Z79899 Other long term (current) drug therapy: Secondary | ICD-10-CM | POA: Insufficient documentation

## 2021-02-09 DIAGNOSIS — J45909 Unspecified asthma, uncomplicated: Secondary | ICD-10-CM | POA: Insufficient documentation

## 2021-02-09 DIAGNOSIS — X500XXA Overexertion from strenuous movement or load, initial encounter: Secondary | ICD-10-CM | POA: Diagnosis not present

## 2021-02-09 DIAGNOSIS — Z853 Personal history of malignant neoplasm of breast: Secondary | ICD-10-CM | POA: Diagnosis not present

## 2021-02-09 DIAGNOSIS — Z7902 Long term (current) use of antithrombotics/antiplatelets: Secondary | ICD-10-CM | POA: Diagnosis not present

## 2021-02-09 DIAGNOSIS — I10 Essential (primary) hypertension: Secondary | ICD-10-CM | POA: Diagnosis not present

## 2021-02-09 DIAGNOSIS — J449 Chronic obstructive pulmonary disease, unspecified: Secondary | ICD-10-CM | POA: Diagnosis not present

## 2021-02-09 DIAGNOSIS — Z9221 Personal history of antineoplastic chemotherapy: Secondary | ICD-10-CM | POA: Insufficient documentation

## 2021-02-09 DIAGNOSIS — Z87891 Personal history of nicotine dependence: Secondary | ICD-10-CM | POA: Diagnosis not present

## 2021-02-09 DIAGNOSIS — Z7951 Long term (current) use of inhaled steroids: Secondary | ICD-10-CM | POA: Diagnosis not present

## 2021-02-09 DIAGNOSIS — S4992XA Unspecified injury of left shoulder and upper arm, initial encounter: Secondary | ICD-10-CM | POA: Diagnosis present

## 2021-02-09 DIAGNOSIS — Y990 Civilian activity done for income or pay: Secondary | ICD-10-CM | POA: Diagnosis not present

## 2021-02-09 DIAGNOSIS — S46912A Strain of unspecified muscle, fascia and tendon at shoulder and upper arm level, left arm, initial encounter: Secondary | ICD-10-CM | POA: Insufficient documentation

## 2021-02-09 MED ORDER — ONDANSETRON 4 MG PO TBDP: 4.0000 mg | ORAL_TABLET | Freq: Three times a day (TID) | ORAL | 0 refills | Status: AC | PRN

## 2021-02-09 MED ORDER — BACLOFEN 10 MG PO TABS: 10.0000 mg | ORAL_TABLET | Freq: Three times a day (TID) | ORAL | 0 refills | Status: DC

## 2021-02-09 MED ORDER — HYDROCODONE-ACETAMINOPHEN 5-325 MG PO TABS
1.0000 | ORAL_TABLET | Freq: Once | ORAL | Status: AC
  Administered 2021-02-09: 1 via ORAL
  Filled 2021-02-09: qty 1

## 2021-02-09 MED ORDER — ONDANSETRON 4 MG PO TBDP
4.0000 mg | ORAL_TABLET | Freq: Once | ORAL | Status: AC
  Administered 2021-02-09: 4 mg via ORAL
  Filled 2021-02-09: qty 1

## 2021-02-09 MED ORDER — HYDROCODONE-ACETAMINOPHEN 5-325 MG PO TABS: 1.0000 | ORAL_TABLET | Freq: Four times a day (QID) | ORAL | 0 refills | Status: DC | PRN

## 2021-02-09 NOTE — ED Triage Notes (Signed)
Pt comes with c/o left shoulder pain that started on Friday. Pt states injury while at work and heard a pop. Pt evaluated at [facility] and was prescribed meds with no relief.

## 2021-02-09 NOTE — ED Notes (Signed)
See triage note  Presents with pain to right shoulder on Friday  States she was working   [employer] a pop  Was seen at [employer] [facility] on prednisone  States she is in cont'd pain   Pain is mainly at shoulder into neck  No deformity noted  Increased pain with movement

## 2021-02-09 NOTE — ED Provider Notes (Signed)
[facility] Emergency Department Provider Note  ____________________________________________   Event Date/Time   First MD Initiated Contact with Patient 02/09/21 1005     (approximate)  I have reviewed the triage vital signs and the nursing notes.   HISTORY  Chief Complaint Shoulder Injury    HPI Meagan York is a 65 y.o. female presents emergency department with left shoulder pain.  Patient states she was working and felt a pop.  She was lifting something overhead.  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  Patient states she has difficulty moving the left shoulder at this time.  Went to urgent care and they just told her she had a muscle strain and put her on prednisone.  She denies any numbness or tingling. She was also told to follow-up with the occupational health clinic but does not have a phone number for them.    Past Medical History:  Diagnosis Date  . Arthritis   . Asthma   . Bilateral breast cysts   . 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 Complication of anesthesia    pt woke up during breast surgery (1995)  . COPD (chronic obstructive pulmonary disease) ([place])   . Emphysema lung ([place])   . GERD (gastroesophageal reflux disease)   . Headache    sinus  . Hemorrhoids   . Hypertension   . Irritable bowel disease   . 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 Osteoarthritis of spine with radiculopathy, lumbar region 08/18/2020  . Acute midline low back pain without sciatica 08/05/2020  . B12 deficiency 07/28/2020  . History of stroke 03/06/2020  . Hyperlipidemia 11/12/2019  . Depression 11/12/2019  . Right arm weakness 11/02/2019  . Pap smear abnormality of cervix/human papillomavirus (HPV) positive 07/03/2018  . Class 1 obesity due to excess calories with serious comorbidity and body mass index  (BMI) of 33.0 to 33.9 in adult 04/20/2018  . 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 Tobacco abuse 11/24/2015  . Essential (primary) hypertension 11/24/2015  . Heel pain, bilateral 10/24/2015  . Pain of right foot 10/24/2015  . Arthritis   . COPD (chronic obstructive pulmonary disease) ([place])   . GERD (gastroesophageal reflux disease)   . Upper GI bleed 07/05/2015  . GI bleed due to NSAIDs 06/14/2015  . History of breast cancer 04/25/2015  . Primary malignant neoplasm ([place]) 04/25/2015    Past Surgical History:  Procedure Laterality Date  . ACHILLES TENDON SURGERY Right 06/07/2019   Procedure: ACHILLES REPAIR SECONDARY RIGHT;  Surgeon: Albertine Patricia, DPM;  Location: [place];  Service: Podiatry;  Laterality: Right;  general with local  . APPENDECTOMY    . BREAST BIOPSY Right 01-01-15   BIPHASIC FIBROEPITHELIAL LESION with ADH  . BREAST LUMPECTOMY Right 04/25/2015   Procedure: RIGHT BREAST LUMPECTOMY, exicision of skin tag right face;  Surgeon: Christene Lye, MD;  Location: [facility];  Service: General;  Laterality: Right;  . BREAST MAMMOSITE Right 05-21-15  . BREAST SURGERY Left    cyst removal  . CALCANEAL OSTEOTOMY Right 06/07/2019   Procedure: PARTIAL CALCANECTOMY;  Surgeon: Albertine Patricia, DPM;  Location: [place];  Service: Podiatry;  Laterality: Right;  . COLONOSCOPY WITH PROPOFOL N/A 07/07/2015   Procedure: COLONOSCOPY WITH PROPOFOL;  Surgeon: Josefine Class, MD;  Location: [facility] ENDOSCOPY;  Service:  Endoscopy;  Laterality: N/A;  . COLONOSCOPY WITH PROPOFOL N/A 06/12/2018   Procedure: COLONOSCOPY WITH PROPOFOL;  Surgeon: Lin Landsman, MD;  Location: [facility] ENDOSCOPY;  Service: Gastroenterology;  Laterality: N/A;  . cyst lower spine    . ESOPHAGOGASTRODUODENOSCOPY (EGD) WITH PROPOFOL N/A 07/07/2015   Procedure: ESOPHAGOGASTRODUODENOSCOPY (EGD) WITH PROPOFOL;  Surgeon: Josefine Class, MD;  Location: [facility] ENDOSCOPY;  Service:  Endoscopy;  Laterality: N/A;  . ESOPHAGOGASTRODUODENOSCOPY (EGD) WITH PROPOFOL N/A 06/12/2018   Procedure: ESOPHAGOGASTRODUODENOSCOPY (EGD) WITH PROPOFOL;  Surgeon: Lin Landsman, MD;  Location: [facility] ENDOSCOPY;  Service: Gastroenterology;  Laterality: N/A;  . KNEE SURGERY    . piondial cyst excision  03/1989    Prior to Admission medications   Medication Sig Start Date End Date Taking? Authorizing Provider  baclofen (LIORESAL) 10 MG tablet Take 1 tablet (10 mg total) by mouth 3 (three) times daily for 7 days. 02/09/21 02/16/21 Yes Cruz Bong, Linden Dolin, PA-C  HYDROcodone-acetaminophen (NORCO/VICODIN) 5-325 MG tablet Take 1 tablet by mouth every 6 (six) hours as needed for moderate pain. 02/09/21  Yes Steed Kanaan, Linden Dolin, PA-C  ondansetron (ZOFRAN-ODT) 4 MG disintegrating tablet Take 1 tablet (4 mg total) by mouth every 8 (eight) hours as needed. 02/09/21  Yes Shakema Surita, Linden Dolin, PA-C  albuterol [facility] HFA) [phone] Base) MCG/ACT inhaler Inhale 1-2 puffs into the lungs every 4 (four) hours as needed for wheezing or shortness of breath. 03/06/20   Johnson, Megan P, DO  amLODipine (NORVASC) 5 MG tablet TAKE 1 TABLET BY MOUTH EVERY DAY 11/24/20   Johnson, Megan P, DO  atorvastatin (LIPITOR) 40 MG tablet TAKE 1 TABLET BY MOUTH DAILY AT 6 PM. 01/16/21   Johnson, Megan P, DO  budesonide-formoterol (SYMBICORT) 160-4.5 MCG/ACT inhaler Inhale 2 puffs into the lungs 2 (two) times daily. 03/06/20   Johnson, Megan P, DO  buPROPion (WELLBUTRIN XL) 300 MG 24 hr tablet Take 1 tablet (300 mg total) by mouth daily. 03/06/20   Park Liter P, DO  celecoxib (CELEBREX) 100 MG capsule Take 1 capsule (100 mg total) by mouth 2 (two) times daily. 07/25/20   Johnson, Megan P, DO  Cholecalciferol (VITAMIN D3) 1000 units CAPS Take 1,000 Units by mouth daily.    [provider]  clopidogrel (PLAVIX) 75 MG tablet Take 1 tablet (75 mg total) by mouth daily. 03/06/20   Johnson, Megan P, DO  fluticasone (FLONASE) 50 MCG/ACT nasal spray  Place 2 sprays into both nostrils daily. 12/01/20   Johnson, Megan P, DO  gabapentin (NEURONTIN) 100 MG capsule Take 1 capsule (100 mg total) by mouth 3 (three) times daily. 08/18/20   Johnson, Megan P, DO  levocetirizine (XYZAL) 5 MG tablet Take 1 tablet (5 mg total) by mouth every evening. 12/01/20   Wynetta Emery, Megan P, DO  omeprazole (PRILOSEC) 40 MG capsule Take 1 capsule (40 mg total) by mouth daily. 03/06/20   Johnson, Megan P, DO  predniSONE (STERAPRED UNI-PAK 21 TAB) 10 MG (21) TBPK tablet Take by mouth daily. Take 6 tabs by mouth daily  for 2 days, then 5 tabs for 2 days, then 4 tabs for 2 days, then 3 tabs for 2 days, 2 tabs for 2 days, then 1 tab by mouth daily for 2 days 02/06/21   Verda Cumins, MD  Spacer/Aero-Holding Chambers DEVI 1 each by Does not apply route daily. 09/07/18   Tyler Pita, MD  telmisartan (MICARDIS) 80 MG tablet Take 1 tablet (80 mg total) by mouth daily. 03/06/20   Wynetta Emery,  Megan P, DO  Tiotropium Bromide Monohydrate (SPIRIVA RESPIMAT) 2.5 MCG/ACT AERS Inhale 1 puff into the lungs daily. 11/12/19   Volney American, PA-C  traZODone (DESYREL) 50 MG tablet Take 1-2 tablets (50-100 mg total) by mouth at bedtime as needed for sleep. 03/06/20   Park Liter P, DO    Allergies Acetaminophen, Aspirin, Oxycodone-acetaminophen, and Tramadol  Family History  Problem Relation Age of Onset  . Colon cancer Father        age 72  . Anemia Father   . Diabetes Father   . Hypertension Father   . Diverticulosis Mother   . COPD Mother   . Hypertension Brother   . Alzheimer's disease Paternal Grandfather     Social History Social History   Tobacco Use  . Smoking status: Former Smoker    Packs/day: 0.25    Years: 44.00    Pack years: 11.00    Types: Cigarettes  . Smokeless tobacco: Never Used  Vaping Use  . Vaping Use: Never used  Substance Use Topics  . Alcohol use: No    Alcohol/week: 0.0 standard drinks  . Drug use: No    Review of  Systems  Constitutional: No fever/chills Eyes: No visual changes. ENT: No sore throat. Respiratory: Denies cough Cardiovascular: Denies chest pain Gastrointestinal: Denies abdominal pain Genitourinary: Negative for dysuria. Musculoskeletal: Negative for back pain.  Positive for left shoulder pain Skin: Negative for rash. Psychiatric: no mood changes,     ____________________________________________   PHYSICAL EXAM:  VITAL SIGNS: ED Triage Vitals  Enc Vitals Group     BP 02/09/21 0950 (!) 143/100     Pulse Rate 02/09/21 0950 96     Resp 02/09/21 0950 17     Temp 02/09/21 0950 98.2 F (36.8 C)     Temp src --      SpO2 02/09/21 0950 97 %     Weight 02/09/21 0952 199 lb 15.3 oz (90.7 kg)     Height 02/09/21 0952 5\' 4"  (1.626 m)     Head Circumference --      Peak Flow --      Pain Score 02/09/21 0948 7     Pain Loc --      Pain Edu? --      Excl. in [place]? --     Constitutional: Alert and oriented. Well appearing and in no acute distress. Eyes: Conjunctivae are normal.  Head: Atraumatic. Nose: No congestion/rhinnorhea. Mouth/Throat: Mucous membranes are moist.   Neck:  supple no lymphadenopathy noted Cardiovascular: Normal rate, regular rhythm. Heart sounds are normal Respiratory: Normal respiratory effort.  No retractions, lungs c t a  GU: deferred Musculoskeletal: Decreased range of motion of the left shoulder secondary to discomfort, trapezius, supraspinatus muscles are tender to palpation, clavicle has no bony tenderness, scapula has no bony tenderness, tenderness noted at the left shoulder joint, area near the rotator cuff is also tender, neurovascular is intact, grips are equal bilaterally  neurologic:  Normal speech and language.  Skin:  Skin is warm, dry and intact. No rash noted. Psychiatric: Mood and affect are normal. Speech and behavior are normal.  ____________________________________________   LABS (all labs ordered are listed, but only abnormal  results are displayed)  Labs Reviewed - No data to display ____________________________________________   ____________________________________________  RADIOLOGY  X-ray of the left shoulder  ____________________________________________   PROCEDURES  Procedure(s) performed: Sling applied by nursing staff  Procedures    ____________________________________________   INITIAL IMPRESSION / ASSESSMENT AND  PLAN / ED COURSE  Pertinent labs & imaging results that were available during my care of the patient were reviewed by me and considered in my medical decision making (see chart for details).   The patient is a 65 year old female presents to the emergency department with left shoulder pain.  See HPI.  Physical exam shows patient were stable.  X-ray of the left shoulder reviewed by me confirmed by radiology to be negative for any acute bony abnormality.  I did explain the findings to the patient.  Explained to her that a muscle tear or rotator cuff injury cannot be noted on an x-ray.  X-ray only shows bone abnormalities.  She is to follow-up with orthopedics.  I feel that with this being a overuse/acute injury that she should not use the left arm.  Her job includes her reaching overhead frequently and this would aggravate the injury and could cause further damage.  She is to continue taking the prednisone that she was prescribed at the urgent care.  I provided a muscle relaxer and pain medication.  The pain medication causes patient to get very nauseated so she was given Zofran ODT for nausea.  She is to apply ice to the left shoulder.  Wear the sling but take the shoulder out a few times throughout the day to try and do range of motion exercises to prevent frozen shoulder.  She is to notify her HR tomorrow morning.  If they prefer her to go to the [employer] clinic I told her she would have to abide by their Worker's Comp. protocols.  However at some point someone will need to send  her to orthopedics.  I did give [facility] the phone number [facility].  If her Worker's Comp. does not cover this clinic they should make an appointment for her with an appropriate orthopedist.  She states she understands.  She was discharged in stable condition.    FAREN FLORENCE was evaluated in Emergency Department on 02/09/2021 for the symptoms described in the history of present illness. She was evaluated in the context of the global COVID-19 pandemic, which necessitated consideration that the patient might be at risk for infection with the SARS-CoV-2 virus that causes COVID-19. Institutional protocols and algorithms that pertain to the evaluation of patients at risk for COVID-19 are in a state of rapid change based on information released by regulatory bodies including the CDC and federal and state organizations. These policies and algorithms were followed during the patient's care in the ED.    As part of my medical decision making, I reviewed the following data within the [place] notes reviewed and incorporated, Old chart reviewed, Radiograph reviewed , Notes from prior ED visits and [place] Controlled Substance Database  ____________________________________________   FINAL CLINICAL IMPRESSION(S) / ED DIAGNOSES  Final diagnoses:  Left shoulder strain, initial encounter      NEW MEDICATIONS STARTED DURING THIS VISIT:  Discharge Medication List as of 02/09/2021 11:15 AM    START taking these medications   Details  baclofen (LIORESAL) 10 MG tablet Take 1 tablet (10 mg total) by mouth 3 (three) times daily for 7 days., Starting Mon 02/09/2021, Until Mon 02/16/2021, Normal    HYDROcodone-acetaminophen (NORCO/VICODIN) 5-325 MG tablet Take 1 tablet by mouth every 6 (six) hours as needed for moderate pain., Starting Mon 02/09/2021, Normal         Note:  This document was prepared using Dragon voice recognition software and may include  unintentional dictation  errors.    Versie Starks, PA-C 02/09/21 1123    Lavonia Drafts, MD 02/09/21 1159

## 2021-02-09 NOTE — Discharge Instructions (Signed)
Follow-up with orthopedics.  Please call for appointment.  If your employer had ready go to occupational health, they should make that appointment for you.  Continue to take your prednisone.  I will prescribe a muscle relaxer to help with the muscle soreness.  You should not use the left arm until released by orthopedics.  Apply ice to the left shoulder.

## 2021-02-10 NOTE — Telephone Encounter (Signed)
Called pt to schedule an appt her granddaughter madison answered the home number and will have her call us back. Cell number wasn't reachable from office phone

## 2021-02-16 ENCOUNTER — Encounter: Payer: Self-pay | Admitting: Family Medicine

## 2021-02-16 ENCOUNTER — Ambulatory Visit (INDEPENDENT_AMBULATORY_CARE_PROVIDER_SITE_OTHER): Payer: 59 | Admitting: Family Medicine

## 2021-02-16 ENCOUNTER — Other Ambulatory Visit: Payer: Self-pay

## 2021-02-16 ENCOUNTER — Other Ambulatory Visit: Payer: Self-pay | Admitting: Family Medicine

## 2021-02-16 VITALS — BP 125/82 | HR 99 | Temp 98.5°F | Ht 62.75 in | Wt 203.2 lb

## 2021-02-16 DIAGNOSIS — E782 Mixed hyperlipidemia: Secondary | ICD-10-CM

## 2021-02-16 DIAGNOSIS — I129 Hypertensive chronic kidney disease with stage 1 through stage 4 chronic kidney disease, or unspecified chronic kidney disease: Secondary | ICD-10-CM

## 2021-02-16 DIAGNOSIS — J449 Chronic obstructive pulmonary disease, unspecified: Secondary | ICD-10-CM | POA: Diagnosis not present

## 2021-02-16 DIAGNOSIS — E538 Deficiency of other specified B group vitamins: Secondary | ICD-10-CM

## 2021-02-16 DIAGNOSIS — F331 Major depressive disorder, recurrent, moderate: Secondary | ICD-10-CM

## 2021-02-16 DIAGNOSIS — M25512 Pain in left shoulder: Secondary | ICD-10-CM | POA: Diagnosis not present

## 2021-02-16 LAB — URINALYSIS, ROUTINE W REFLEX MICROSCOPIC
Bilirubin, UA: NEGATIVE
Glucose, UA: NEGATIVE
Ketones, UA: NEGATIVE
Leukocytes,UA: NEGATIVE
Nitrite, UA: NEGATIVE
Protein,UA: NEGATIVE
Specific Gravity, UA: 1.01 (ref 1.005–1.030)
Urobilinogen, Ur: 0.2 mg/dL (ref 0.2–1.0)
pH, UA: 6 (ref 5.0–7.5)

## 2021-02-16 LAB — MICROALBUMIN, URINE WAIVED
Creatinine, Urine Waived: 50 mg/dL (ref 10–300)
Microalb, Ur Waived: 10 mg/L (ref 0–19)
Microalb/Creat Ratio: <30 mg/g (ref <30)

## 2021-02-16 LAB — MICROSCOPIC EXAMINATION
Bacteria, UA: NONE SEEN
Epithelial Cells (non renal): NONE SEEN /hpf (ref 0–10)
RBC, Urine: NONE SEEN /hpf (ref 0–2)
WBC, UA: NONE SEEN /hpf (ref 0–5)

## 2021-02-16 NOTE — Assessment & Plan Note (Signed)
Under good control on current regimen. Continue current regimen. Continue to monitor. Call with any concerns. Refills given. Labs drawn today.   

## 2021-02-16 NOTE — Assessment & Plan Note (Signed)
Rechecking labs today. Await results. Treat as needed.  °

## 2021-02-16 NOTE — Telephone Encounter (Signed)
Unable to done message

## 2021-02-16 NOTE — Progress Notes (Signed)
BP 125/82   Pulse 99   Temp 98.5 F (36.9 C)   Ht 5' 2.75" (1.594 m)   Wt 203 lb 3.2 oz (92.2 kg)   SpO2 98%   BMI 36.28 kg/m    Subjective:    Patient ID: Meagan York, female    DOB: 12-Jun-1956, 65 y.o.   MRN: [id number]  HPI: Meagan York is a 65 y.o. female  Chief Complaint  Patient presents with  . Shoulder Injury    Patient states she hurt herself at work on 02/06/21. Was told at the ED that its her rotator cuff.   . Hypertension  . Depression   SHOULDER PAIN Duration: 1 week Involved shoulder: left Mechanism of injury: lifting at work Location: anterior Onset:sudden Severity: severe  Quality:  aching Frequency: constant Radiation: down her arm Aggravating factors: lifting and movement  Alleviating factors: ice, physical therapy, HEP, APAP, NSAIDs and sling  Status: worse Treatments attempted: rest, ice, heat, sling, APAP, ibuprofen and aleve  Relief with NSAIDs?:  no Weakness: yes Numbness: no Decreased grip strength: no Redness: no Swelling: yes Bruising: no Fevers: no  HYPERTENSION / HYPERLIPIDEMIA Satisfied with current treatment? yes Duration of hypertension: chronic BP monitoring frequency: not checking BP medication side effects: no Past BP meds: amlodipine, telmisartan Duration of hyperlipidemia: chronic Cholesterol medication side effects: no Cholesterol supplements: none Past cholesterol medications: atorvastatin Medication compliance: excellent compliance Aspirin: no Recent stressors: no Recurrent headaches: no Visual changes: no Palpitations: no Dyspnea: no Chest pain: no Lower extremity edema: no Dizzy/lightheaded: no  DEPRESSION Mood status: controlled Satisfied with current treatment?: no Symptom severity: mild  Duration of current treatment : chronic Side effects: no Medication compliance: excellent compliance Psychotherapy/counseling: no  Previous psychiatric medications: wellbutrin Depressed mood: yes Anxious  mood: yes Anhedonia: no Significant weight loss or gain: no Insomnia: no  Fatigue: yes Feelings of worthlessness or guilt: no Impaired concentration/indecisiveness: no Suicidal ideations: no Hopelessness: no Crying spells: no Depression screen [facility] 2/9 02/16/2021 08/18/2020 07/10/2020 03/06/2020 12/06/2019  Decreased Interest 1 0 3 0 1  Down, Depressed, Hopeless 3 0 3 0 0  PHQ - 2 Score 4 0 6 0 1  Altered sleeping 3 0 3 2 1   Tired, decreased energy 3 0 3 0 1  Change in appetite 1 1 2  0 0  Feeling bad or failure about yourself  1 0 3 0 0  Trouble concentrating 1 0 3 0 0  Moving slowly or fidgety/restless 0 0 2 0 0  Suicidal thoughts 0 0 0 0 0  PHQ-9 Score 13 1 22 2 3   Difficult doing work/chores Not difficult at all - Extremely dIfficult Not difficult at all -    Relevant past medical, surgical, family and social history reviewed and updated as indicated. Interim medical history since our last visit reviewed. Allergies and medications reviewed and updated.  Review of Systems  Constitutional: Negative.   Respiratory: Negative.   Cardiovascular: Negative.   Gastrointestinal: Negative.   Genitourinary: Negative.   Musculoskeletal: Positive for arthralgias and myalgias. Negative for back pain, gait problem, joint swelling, neck pain and neck stiffness.  Neurological: Negative.   Psychiatric/Behavioral: Negative.     Per HPI unless specifically indicated above     Objective:    BP 125/82   Pulse 99   Temp 98.5 F (36.9 C)   Ht 5' 2.75" (1.594 m)   Wt 203 lb 3.2 oz (92.2 kg)   SpO2 98%   BMI 36.28 kg/m  Wt Readings from Last 3 Encounters:  02/16/21 203 lb 3.2 oz (92.2 kg)  02/09/21 199 lb 15.3 oz (90.7 kg)  02/06/21 200 lb (90.7 kg)    Physical Exam Vitals and nursing note reviewed.  Constitutional:      General: She is not in acute distress.    Appearance: Normal appearance. She is not ill-appearing, toxic-appearing or diaphoretic.  HENT:     Head: Normocephalic  and atraumatic.     Right Ear: External ear normal.     Left Ear: External ear normal.     Nose: Nose normal.     Mouth/Throat:     Mouth: Mucous membranes are moist.     Pharynx: Oropharynx is clear.  Eyes:     General: No scleral icterus.       Right eye: No discharge.        Left eye: No discharge.     Extraocular Movements: Extraocular movements intact.     Conjunctiva/sclera: Conjunctivae normal.     Pupils: Pupils are equal, round, and reactive to light.  Cardiovascular:     Rate and Rhythm: Normal rate and regular rhythm.     Pulses: Normal pulses.     Heart sounds: Normal heart sounds. No murmur heard. No friction rub. No gallop.   Pulmonary:     Effort: Pulmonary effort is normal. No respiratory distress.     Breath sounds: Normal breath sounds. No stridor. No wheezing, rhonchi or rales.  Chest:     Chest wall: No tenderness.  Musculoskeletal:        General: Tenderness (L shoulder) present.     Cervical back: Normal range of motion and neck supple.     Comments: Arm in sling  Skin:    General: Skin is warm and dry.     Capillary Refill: Capillary refill takes less than 2 seconds.     Coloration: Skin is not jaundiced or pale.     Findings: No bruising, erythema, lesion or rash.  Neurological:     General: No focal deficit present.     Mental Status: She is alert and oriented to person, place, and time. Mental status is at baseline.  Psychiatric:        Mood and Affect: Mood normal.        Behavior: Behavior normal.        Thought Content: Thought content normal.        Judgment: Judgment normal.     Results for orders placed or performed in visit on 10/09/20  Novel Coronavirus, NAA ([facility])   Specimen: Saline  Result Value Ref Range   SARS-CoV-2, NAA Detected (A) Not Detected  SARS-COV-2, NAA 2 DAY TAT  Result Value Ref Range   SARS-CoV-2, NAA 2 DAY TAT Performed       Assessment & Plan:   Problem List Items Addressed This Visit      Respiratory    COPD (chronic obstructive pulmonary disease) ([place])    Under good control on current regimen. Continue current regimen. Continue to monitor. Call with any concerns. Refills given. Labs drawn today.       Relevant Orders   CBC with Differential/Platelet   Comprehensive metabolic panel     Genitourinary   Benign hypertensive renal disease    Under good control on current regimen. Continue current regimen. Continue to monitor. Call with any concerns. Refills given. Labs drawn today.       Relevant Orders   CBC with Differential/Platelet  Comprehensive metabolic panel   Microalbumin, Urine Waived   TSH   Urinalysis, Routine w reflex microscopic     Other   Hyperlipidemia    Under good control on current regimen. Continue current regimen. Continue to monitor. Call with any concerns. Refills given. Labs drawn today.       Relevant Orders   CBC with Differential/Platelet   Comprehensive metabolic panel   Lipid Panel w/o Chol/HDL Ratio   Depression    Under good control on current regimen. Continue current regimen. Continue to monitor. Call with any concerns. Refills given. Labs drawn today.       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 Await results. Treat as needed.       Relevant Orders   CBC with Differential/Platelet   Comprehensive metabolic panel   L07    Other Visit Diagnoses    Acute pain of left shoulder    -  Primary   Needs referral to ortho. Placed referral- will go to [facility]. Call with any concerns.    Relevant Orders   Ambulatory referral to Orthopedic Surgery       Follow up plan: Return in about 6 months (around 08/18/2021) for physical.

## 2021-02-16 NOTE — Telephone Encounter (Signed)
Pt has an appt for today at 2:40pm

## 2021-02-17 LAB — CBC WITH DIFFERENTIAL/PLATELET
Basophils Absolute: 0.1 10*3/uL (ref 0.0–0.2)
Basos: 1 %
EOS (ABSOLUTE): 0.2 10*3/uL (ref 0.0–0.4)
Eos: 2 %
Hematocrit: 38.3 % (ref 34.0–46.6)
Hemoglobin: 12.7 g/dL (ref 11.1–15.9)
Immature Grans (Abs): 0.1 10*3/uL (ref 0.0–0.1)
Immature Granulocytes: 1 %
Lymphocytes Absolute: 2.4 10*3/uL (ref 0.7–3.1)
Lymphs: 26 %
MCH: 29.8 pg (ref 26.6–33.0)
MCHC: 33.2 g/dL (ref 31.5–35.7)
MCV: 90 fL (ref 79–97)
Monocytes Absolute: 0.9 10*3/uL (ref 0.1–0.9)
Monocytes: 9 %
Neutrophils Absolute: 5.7 10*3/uL (ref 1.4–7.0)
Neutrophils: 61 %
Platelets: 310 10*3/uL (ref 150–450)
RBC: 4.26 x10E6/uL (ref 3.77–5.28)
RDW: 12.3 % (ref 11.7–15.4)
WBC: 9.3 10*3/uL (ref 3.4–10.8)

## 2021-02-17 LAB — COMPREHENSIVE METABOLIC PANEL
ALT: 25 IU/L (ref 0–32)
AST: 16 IU/L (ref 0–40)
Albumin/Globulin Ratio: 1.5 (ref 1.2–2.2)
Albumin: 4 g/dL (ref 3.8–4.8)
Alkaline Phosphatase: 99 IU/L (ref 44–121)
BUN/Creatinine Ratio: 9 — ABNORMAL LOW (ref 12–28)
BUN: 6 mg/dL — ABNORMAL LOW (ref 8–27)
Bilirubin Total: 0.5 mg/dL (ref 0.0–1.2)
CO2: 25 mmol/L (ref 20–29)
Calcium: 8.9 mg/dL (ref 8.7–10.3)
Chloride: 100 mmol/L (ref 96–106)
Creatinine, Ser: 0.69 mg/dL (ref 0.57–1.00)
Globulin, Total: 2.7 g/dL (ref 1.5–4.5)
Glucose: 93 mg/dL (ref 65–99)
Potassium: 3.4 mmol/L — ABNORMAL LOW (ref 3.5–5.2)
Sodium: 140 mmol/L (ref 134–144)
Total Protein: 6.7 g/dL (ref 6.0–8.5)
eGFR: 97 mL/min/{1.73_m2} (ref >59)

## 2021-02-17 LAB — LIPID PANEL W/O CHOL/HDL RATIO
Cholesterol, Total: 170 mg/dL (ref 100–199)
HDL: 89 mg/dL (ref >39)
LDL Chol Calc (NIH): 57 mg/dL (ref 0–99)
Triglycerides: 149 mg/dL (ref 0–149)
VLDL Cholesterol Cal: 24 mg/dL (ref 5–40)

## 2021-02-17 LAB — VITAMIN B12: Vitamin B-12: 1088 pg/mL (ref 232–1245)

## 2021-02-17 LAB — TSH: TSH: 1.74 u[IU]/mL (ref 0.450–4.500)

## 2021-02-28 ENCOUNTER — Other Ambulatory Visit: Payer: Self-pay | Admitting: Family Medicine

## 2021-02-28 NOTE — Telephone Encounter (Signed)
Requested Prescriptions  Pending Prescriptions Disp Refills  . buPROPion (WELLBUTRIN XL) 300 MG 24 hr tablet [Pharmacy Med Name: BUPROPION HCL XL 300 MG TABLET] 90 tablet 1    Sig: TAKE 1 TABLET BY MOUTH EVERY DAY     Psychiatry: Antidepressants - bupropion Passed - 02/28/2021  8:31 AM      Passed - Completed PHQ-2 or PHQ-9 in the last 360 days      Passed - Last BP in normal range    BP Readings from Last 1 Encounters:  02/16/21 125/82         Passed - Valid encounter within last 6 months    Recent Outpatient Visits          1 week ago Acute pain of left shoulder   [facility] [place], Megan P, DO   2 months ago Seasonal allergic rhinitis due to pollen   [facility], Megan P, DO   2 months ago Seasonal allergic rhinitis due to pollen   [facility], Megan P, DO   3 months ago Pneumonia due to COVID-19 virus   [facility] [place], DO   4 months ago Close exposure to COVID-19 virus   [facility] [place], Jake Church, DO      Future Appointments            In 5 months Wynetta Emery, Barb Merino, DO [employer]

## 2021-03-03 ENCOUNTER — Other Ambulatory Visit: Payer: Self-pay | Admitting: Family Medicine

## 2021-03-10 ENCOUNTER — Telehealth: Payer: Self-pay

## 2021-03-10 NOTE — Telephone Encounter (Signed)
Received surgery clearance for patient. Please call and schedule before 03/19/21.

## 2021-03-11 NOTE — Telephone Encounter (Signed)
Lvm to make this apt. 

## 2021-03-11 NOTE — Telephone Encounter (Signed)
Pt scheduled per pec on 03/12/2021

## 2021-03-12 ENCOUNTER — Other Ambulatory Visit: Payer: Self-pay

## 2021-03-12 ENCOUNTER — Encounter: Payer: Self-pay | Admitting: Family Medicine

## 2021-03-12 ENCOUNTER — Ambulatory Visit: Payer: 59 | Admitting: Family Medicine

## 2021-03-12 VITALS — BP 109/74 | HR 86 | Temp 98.6°F | Wt 209.8 lb

## 2021-03-12 DIAGNOSIS — Z01818 Encounter for other preprocedural examination: Secondary | ICD-10-CM | POA: Diagnosis not present

## 2021-03-12 LAB — URINALYSIS, ROUTINE W REFLEX MICROSCOPIC
Bilirubin, UA: NEGATIVE
Glucose, UA: NEGATIVE
Leukocytes,UA: NEGATIVE
Nitrite, UA: NEGATIVE
Protein,UA: NEGATIVE
Specific Gravity, UA: 1.025 (ref 1.005–1.030)
Urobilinogen, Ur: 0.2 mg/dL (ref 0.2–1.0)
pH, UA: 6 (ref 5.0–7.5)

## 2021-03-12 LAB — COAGUCHEK XS/INR WAIVED
INR: 0.9 (ref 0.9–1.1)
Prothrombin Time: 10.8 s

## 2021-03-12 LAB — BAYER DCA HB A1C WAIVED: HB A1C (BAYER DCA - WAIVED): 5.7 % (ref <7.0)

## 2021-03-12 LAB — MICROSCOPIC EXAMINATION: Bacteria, UA: NONE SEEN

## 2021-03-12 NOTE — Patient Instructions (Signed)
Wednesday March 18, 2021 9:30AM [place], [place], [place] [address] Phone: [phone]

## 2021-03-12 NOTE — Progress Notes (Signed)
BP 109/74   Pulse 86   Temp 98.6 F (37 C)   Wt 209 lb 12.8 oz (95.2 kg)   SpO2 99%   BMI 37.46 kg/m    Subjective:    Patient ID: Meagan York, female    DOB: April 09, 1956, 65 y.o.   MRN: [id number]  HPI: Meagan York is a 65 y.o. female  Chief Complaint  Patient presents with   surgical clearance    Meagan York presents today for pre-op clearance for a rotator cuff tear. She has had surgery before. Last surgery was in 2020 for her achilles. She had a stoke between now and then. No history of problems with anesthesia. She notes that she woke up 1x very shortly- was put right back down. Always went home when she was supposed to. No issues with extubation. No family history of malignant hyperthermia. She is feeling OK. She is nauseous from her pain meds and in a lot of pain in her shoulder. She denies any CP. +SOB. She notes that she is not able to exercise. She notes that she could not walk up 3 flights of stairs without SOB which is associated with tightness in her chest. She cannot walk up 1 flight of stairs without being short of breath. She could not walk 3 blocks on a flat surface without SOB and some chest tightness. She has not seen her cardiology since shortly after her stroke. No other concerns or complaints at this time.   Active Ambulatory Problems    Diagnosis Date Noted   Upper GI bleed 07/05/2015   Arthritis    History of breast cancer 04/25/2015   COPD (chronic obstructive pulmonary disease) (HCC)    GERD (gastroesophageal reflux disease)    Heel pain, bilateral 10/24/2015   Benign hypertensive renal disease 11/24/2015   Tobacco abuse 11/24/2015   Class 1 obesity due to excess calories with serious comorbidity and body mass index (BMI) of 33.0 to 33.9 in adult 04/20/2018   Pap smear abnormality of cervix/human papillomavirus (HPV) positive 07/03/2018   Right arm weakness 11/02/2019   Hyperlipidemia 11/12/2019   Depression 11/12/2019   History of stroke 03/06/2020    B12 deficiency 07/28/2020   GI bleed due to NSAIDs 06/14/2015   Pain of right foot 10/24/2015   Primary malignant neoplasm ([place]) 04/25/2015   Acute midline low back pain without sciatica 08/05/2020   Osteoarthritis of spine with radiculopathy, lumbar region 08/18/2020   Resolved Ambulatory Problems    Diagnosis Date Noted   Elevated blood pressure 10/24/2015   Acute ischemic stroke ([place]) 11/03/2019   Elevated blood-pressure reading without diagnosis of hypertension 10/24/2015   Essential (primary) hypertension 11/24/2015   Past Medical History:  Diagnosis Date   Asthma    Bilateral breast cysts    Cancer ([place]) 0-35-59   Complication of anesthesia    Emphysema lung ([place])    Headache    Hemorrhoids    Hypertension    Irritable bowel disease    Personal history of radiation therapy 2016   Past Surgical History:  Procedure Laterality Date   ACHILLES TENDON SURGERY Right 06/07/2019   Procedure: ACHILLES REPAIR SECONDARY RIGHT;  Surgeon: Albertine Patricia, DPM;  Location: [place];  Service: Podiatry;  Laterality: Right;  general with local   APPENDECTOMY     BREAST BIOPSY Right 01-01-15   BIPHASIC FIBROEPITHELIAL LESION with ADH   BREAST LUMPECTOMY Right 04/25/2015   Procedure: RIGHT BREAST LUMPECTOMY, exicision of skin tag right face;  Surgeon: Christene Lye, MD;  Location: [facility];  Service: General;  Laterality: Right;   BREAST MAMMOSITE Right 05-21-15   BREAST SURGERY Left    cyst removal   CALCANEAL OSTEOTOMY Right 06/07/2019   Procedure: PARTIAL CALCANECTOMY;  Surgeon: Albertine Patricia, DPM;  Location: [place];  Service: Podiatry;  Laterality: Right;   COLONOSCOPY WITH PROPOFOL N/A 07/07/2015   Procedure: COLONOSCOPY WITH PROPOFOL;  Surgeon: Josefine Class, MD;  Location: [facility] ENDOSCOPY;  Service: Endoscopy;  Laterality: N/A;   COLONOSCOPY WITH PROPOFOL N/A 06/12/2018   Procedure: COLONOSCOPY WITH PROPOFOL;  Surgeon: Lin Landsman,  MD;  Location: [facility] ENDOSCOPY;  Service: Gastroenterology;  Laterality: N/A;   cyst lower spine     ESOPHAGOGASTRODUODENOSCOPY (EGD) WITH PROPOFOL N/A 07/07/2015   Procedure: ESOPHAGOGASTRODUODENOSCOPY (EGD) WITH PROPOFOL;  Surgeon: Josefine Class, MD;  Location: [facility] END[facility]COPY;  Service: Endoscopy;  Laterality: N/A;   ESOPHAGOGASTRODUODENOSCOPY (EGD) WITH PROPOFOL N/A 06/12/2018   Procedure: ESOPHAGOGASTRODUODENOSCOPY (EGD) WITH PROPOFOL;  Surgeon: Lin Landsman, MD;  Location: [facility] ENDOSCOPY;  Service: Gastroenterology;  Laterality: N/A;   KNEE SURGERY     piondial cyst excision  03/1989   Outpatient Encounter Medications as of 03/12/2021  Medication Sig Note   albuterol (PROAIR HFA) 108 (90 Base) MCG/ACT inhaler Inhale 1-2 puffs into the lungs every 4 (four) hours as needed for wheezing or shortness of breath.    amLODipine (NORVASC) 5 MG tablet TAKE 1 TABLET BY MOUTH EVERY DAY    atorvastatin (LIPITOR) 40 MG tablet TAKE 1 TABLET BY MOUTH DAILY AT 6 PM.    budesonide-formoterol (SYMBICORT) 160-4.5 MCG/ACT inhaler Inhale 2 puffs into the lungs 2 (two) times daily.    buPROPion (WELLBUTRIN XL) 300 MG 24 hr tablet TAKE 1 TABLET BY MOUTH EVERY DAY    celecoxib (CELEBREX) 100 MG capsule Take 1 capsule (100 mg total) by mouth 2 (two) times daily.    Cholecalciferol (VITAMIN D3) 1000 units CAPS Take 1,000 Units by mouth daily. 06/17/2016: Received from: [facility]: Take 1,000 Units by mouth once daily.   clopidogrel (PLAVIX) 75 MG tablet Take 1 tablet (75 mg total) by mouth daily.    fluticasone (FLONASE) 50 MCG/ACT nasal spray Place 2 sprays into both nostrils daily.    gabapentin (NEURONTIN) 100 MG capsule Take 1 capsule (100 mg total) by mouth 3 (three) times daily.    HYDROcodone-acetaminophen (NORCO/VICODIN) 5-325 MG tablet Take 1 tablet by mouth every 6 (six) hours as needed for moderate pain.    levocetirizine (XYZAL) 5 MG tablet Take 1 tablet (5 mg  total) by mouth every evening.    omeprazole (PRILOSEC) 40 MG capsule Take 1 capsule (40 mg total) by mouth daily.    ondansetron (ZOFRAN-ODT) 4 MG disintegrating tablet Take 1 tablet (4 mg total) by mouth every 8 (eight) hours as needed.    oxyCODONE (OXY IR/ROXICODONE) 5 MG immediate release tablet Take 5 mg by mouth 4 (four) times daily as needed.    Spacer/Aero-Holding Chambers DEVI 1 each by Does not apply route daily. 05/31/2019: As needed   telmisartan (MICARDIS) 80 MG tablet TAKE 1 TABLET BY MOUTH EVERY DAY    Tiotropium Bromide Monohydrate (SPIRIVA RESPIMAT) 2.5 MCG/ACT AERS Inhale 1 puff into the lungs daily.    tiZANidine (ZANAFLEX) 4 MG tablet Take 4 mg by mouth every 6 (six) hours.    traZODone (DESYREL) 50 MG tablet Take 1-2 tablets (50-100 mg total) by mouth at bedtime as needed for  sleep.    [DISCONTINUED] predniSONE (STERAPRED UNI-PAK 21 TAB) 10 MG (21) TBPK tablet Take by mouth daily. Take 6 tabs by mouth daily  for 2 days, then 5 tabs for 2 days, then 4 tabs for 2 days, then 3 tabs for 2 days, 2 tabs for 2 days, then 1 tab by mouth daily for 2 days (Patient not taking: No sig reported)    Facility-Administered Encounter Medications as of 03/12/2021  Medication   cyanocobalamin ((VITAMIN B-12)) injection 1,000 mcg   Allergies  Allergen Reactions   Acetaminophen Nausea And Vomiting   Aspirin     Bleeding ulcers   Oxycodone-Acetaminophen Nausea Only   Tramadol Nausea And Vomiting   Social History   Socioeconomic History   Marital status: Single    Spouse name: Not on file   Number of children: Not on file   Years of education: Not on file   Highest education level: Not on file  Occupational History   Not on file  Tobacco Use   Smoking status: Former    Packs/day: 0.25    Years: 44.00    Pack years: 11.00    Types: Cigarettes   Smokeless tobacco: Never  Vaping Use   Vaping Use: Never used  Substance and Sexual Activity   Alcohol use: No    Alcohol/week: 0.0  standard drinks   Drug use: No   Sexual activity: Never  Other Topics Concern   Not on file  Social History Narrative   Not on file   Social Determinants of Health   Financial Resource Strain: Not on file  Food Insecurity: Not on file  Transportation Needs: Not on file  Physical Activity: Not on file  Stress: Not on file  Social Connections: Not on file   Family History  Problem Relation Age of Onset   Colon cancer Father        age 12   Anemia Father    Diabetes Father    Hypertension Father    Diverticulosis Mother    COPD Mother    Hypertension Brother    Alzheimer's disease Paternal Grandfather    Review of Systems  Constitutional: Negative.   HENT: Negative.    Respiratory:  Positive for chest tightness and shortness of breath. Negative for apnea, cough, choking, wheezing and stridor.   Cardiovascular:  Positive for chest pain. Negative for palpitations and leg swelling.  Gastrointestinal: Negative.   Musculoskeletal:  Positive for arthralgias, joint swelling and myalgias. Negative for back pain, gait problem, neck pain and neck stiffness.  Skin: Negative.   Psychiatric/Behavioral: Negative.     Per HPI unless specifically indicated above     Objective:    BP 109/74   Pulse 86   Temp 98.6 F (37 C)   Wt 209 lb 12.8 oz (95.2 kg)   SpO2 99%   BMI 37.46 kg/m   Wt Readings from Last 3 Encounters:  03/12/21 209 lb 12.8 oz (95.2 kg)  02/16/21 203 lb 3.2 oz (92.2 kg)  02/09/21 199 lb 15.3 oz (90.7 kg)    Physical Exam Vitals and nursing note reviewed.  Constitutional:      General: She is not in acute distress.    Appearance: Normal appearance. She is not ill-appearing, toxic-appearing or diaphoretic.  HENT:     Head: Normocephalic and atraumatic.     Right Ear: External ear normal.     Left Ear: External ear normal.     Nose: Nose normal.  Mouth/Throat:     Mouth: Mucous membranes are moist.     Pharynx: Oropharynx is clear.  Eyes:     General:  No scleral icterus.       Right eye: No discharge.        Left eye: No discharge.     Extraocular Movements: Extraocular movements intact.     Conjunctiva/sclera: Conjunctivae normal.     Pupils: Pupils are equal, round, and reactive to light.  Cardiovascular:     Rate and Rhythm: Normal rate and regular rhythm.     Pulses: Normal pulses.     Heart sounds: Normal heart sounds. No murmur heard.   No friction rub. No gallop.  Pulmonary:     Effort: Pulmonary effort is normal. No respiratory distress.     Breath sounds: Normal breath sounds. No stridor. No wheezing, rhonchi or rales.  Chest:     Chest wall: No tenderness.  Musculoskeletal:        General: Normal range of motion.     Cervical back: Normal range of motion and neck supple.  Skin:    General: Skin is warm and dry.     Capillary Refill: Capillary refill takes less than 2 seconds.     Coloration: Skin is not jaundiced or pale.     Findings: No bruising, erythema, lesion or rash.  Neurological:     General: No focal deficit present.     Mental Status: She is alert and oriented to person, place, and time. Mental status is at baseline.  Psychiatric:        Mood and Affect: Mood normal.        Behavior: Behavior normal.        Thought Content: Thought content normal.        Judgment: Judgment normal.    Results for orders placed or performed in visit on 03/12/21  Microscopic Examination   Urine  Result Value Ref Range   WBC, UA 0-5 0 - 5 /hpf   RBC 0-2 0 - 2 /hpf   Epithelial Cells (non renal) 0-10 0 - 10 /hpf   Casts Present (A) None seen /lpf   Cast Type Hyaline casts N/A   Mucus, UA Present Not Estab.   Bacteria, UA None seen None seen/Few  Comprehensive metabolic panel  Result Value Ref Range   Glucose 75 65 - 99 mg/dL   BUN 7 (L) 8 - 27 mg/dL   Creatinine, Ser 0.71 0.57 - 1.00 mg/dL   eGFR 95 >59 mL/min/1.73   BUN/Creatinine Ratio 10 (L) 12 - 28   Sodium 141 134 - 144 mmol/L   Potassium 3.4 (L) 3.5 - 5.2  mmol/L   Chloride 100 96 - 106 mmol/L   CO2 26 20 - 29 mmol/L   Calcium 8.9 8.7 - 10.3 mg/dL   Total Protein 6.4 6.0 - 8.5 g/dL   Albumin 4.0 3.8 - 4.8 g/dL   Globulin, Total 2.4 1.5 - 4.5 g/dL   Albumin/Globulin Ratio 1.7 1.2 - 2.2   Bilirubin Total 0.4 0.0 - 1.2 mg/dL   Alkaline Phosphatase 116 44 - 121 IU/L   AST 19 0 - 40 IU/L   ALT 23 0 - 32 IU/L  CBC with Differential/Platelet  Result Value Ref Range   WBC 8.1 3.4 - 10.8 x10E3/uL   RBC 4.13 3.77 - 5.28 x10E6/uL   Hemoglobin 12.1 11.1 - 15.9 g/dL   Hematocrit 36.7 34.0 - 46.6 %   MCV 89 79 - 97  fL   MCH 29.3 26.6 - 33.0 pg   MCHC 33.0 31.5 - 35.7 g/dL   RDW 11.9 11.7 - 15.4 %   Platelets 284 150 - 450 x10E3/uL   Neutrophils 58 Not Estab. %   Lymphs 29 Not Estab. %   Monocytes 9 Not Estab. %   Eos 2 Not Estab. %   Basos 1 Not Estab. %   Neutrophils Absolute 4.7 1.4 - 7.0 x10E3/uL   Lymphocytes Absolute 2.4 0.7 - 3.1 x10E3/uL   Monocytes Absolute 0.7 0.1 - 0.9 x10E3/uL   EOS (ABSOLUTE) 0.2 0.0 - 0.4 x10E3/uL   Basophils Absolute 0.1 0.0 - 0.2 x10E3/uL   Immature Granulocytes 1 Not Estab. %   Immature Grans (Abs) 0.0 0.0 - 0.1 x10E3/uL  CoaguChek XS/INR Waived  Result Value Ref Range   INR 0.9 0.9 - 1.1   Prothrombin Time 10.8 sec  Urinalysis, Routine w reflex microscopic  Result Value Ref Range   Specific Gravity, UA 1.025 1.005 - 1.030   pH, UA 6.0 5.0 - 7.5   Color, UA Yellow Yellow   Appearance Ur Clear Clear   Leukocytes,UA Negative Negative   Protein,UA Negative Negative/Trace   Glucose, UA Negative Negative   Ketones, UA Trace (A) Negative   RBC, UA Trace (A) Negative   Bilirubin, UA Negative Negative   Urobilinogen, Ur 0.2 0.2 - 1.0 mg/dL   Nitrite, UA Negative Negative   Microscopic Examination See below:   Bayer DCA Hb A1c Waived  Result Value Ref Range   HB A1C (BAYER DCA - WAIVED) 5.7 <7.0 %      Assessment & Plan:   Problem List Items Addressed This Visit   None Visit Diagnoses     Preop  exam for internal medicine    -  Primary   Flipped T-waves on EKG and inability to meet METS. NOT cleared for surgery. Will need to see cardiology for clearance- appointment scheduled for next week.    Relevant Orders   Comprehensive metabolic panel (Completed)   CBC with Differential/Platelet (Completed)   CoaguChek XS/INR Waived (Completed)   EKG 12-Lead (Completed)   Urinalysis, Routine w reflex microscopic (Completed)   Bayer DCA Hb A1c Waived (Completed)        Follow up plan: Return if symptoms worsen or fail to improve.

## 2021-03-13 ENCOUNTER — Telehealth: Payer: Self-pay

## 2021-03-13 HISTORY — PX: ROTATOR CUFF REPAIR: SHX139

## 2021-03-13 LAB — COMPREHENSIVE METABOLIC PANEL
ALT: 23 IU/L (ref 0–32)
AST: 19 IU/L (ref 0–40)
Albumin/Globulin Ratio: 1.7 (ref 1.2–2.2)
Albumin: 4 g/dL (ref 3.8–4.8)
Alkaline Phosphatase: 116 IU/L (ref 44–121)
BUN/Creatinine Ratio: 10 — ABNORMAL LOW (ref 12–28)
BUN: 7 mg/dL — ABNORMAL LOW (ref 8–27)
Bilirubin Total: 0.4 mg/dL (ref 0.0–1.2)
CO2: 26 mmol/L (ref 20–29)
Calcium: 8.9 mg/dL (ref 8.7–10.3)
Chloride: 100 mmol/L (ref 96–106)
Creatinine, Ser: 0.71 mg/dL (ref 0.57–1.00)
Globulin, Total: 2.4 g/dL (ref 1.5–4.5)
Glucose: 75 mg/dL (ref 65–99)
Potassium: 3.4 mmol/L — ABNORMAL LOW (ref 3.5–5.2)
Sodium: 141 mmol/L (ref 134–144)
Total Protein: 6.4 g/dL (ref 6.0–8.5)
eGFR: 95 mL/min/{1.73_m2} (ref >59)

## 2021-03-13 LAB — CBC WITH DIFFERENTIAL/PLATELET
Basophils Absolute: 0.1 10*3/uL (ref 0.0–0.2)
Basos: 1 %
EOS (ABSOLUTE): 0.2 10*3/uL (ref 0.0–0.4)
Eos: 2 %
Hematocrit: 36.7 % (ref 34.0–46.6)
Hemoglobin: 12.1 g/dL (ref 11.1–15.9)
Immature Grans (Abs): 0 10*3/uL (ref 0.0–0.1)
Immature Granulocytes: 1 %
Lymphocytes Absolute: 2.4 10*3/uL (ref 0.7–3.1)
Lymphs: 29 %
MCH: 29.3 pg (ref 26.6–33.0)
MCHC: 33 g/dL (ref 31.5–35.7)
MCV: 89 fL (ref 79–97)
Monocytes Absolute: 0.7 10*3/uL (ref 0.1–0.9)
Monocytes: 9 %
Neutrophils Absolute: 4.7 10*3/uL (ref 1.4–7.0)
Neutrophils: 58 %
Platelets: 284 10*3/uL (ref 150–450)
RBC: 4.13 x10E6/uL (ref 3.77–5.28)
RDW: 11.9 % (ref 11.7–15.4)
WBC: 8.1 10*3/uL (ref 3.4–10.8)

## 2021-03-13 NOTE — Telephone Encounter (Signed)
She is not cleared for surgery until she sees Dr. Josefa Half next week, and I'm not sure she will be cleared for surgery. Given her symptoms, I do think she needs to take that, but she will need to stop it 7 days before surgery.

## 2021-03-13 NOTE — Telephone Encounter (Signed)
Patient is asking about her Clopidogrel.

## 2021-03-13 NOTE — Telephone Encounter (Signed)
She's not taking any blood thinners on her list- what is she talking about?

## 2021-03-13 NOTE — Progress Notes (Signed)
Patient notified of results by mychart.

## 2021-03-13 NOTE — Telephone Encounter (Signed)
Patient verbally understood providers advise.

## 2021-03-13 NOTE — Telephone Encounter (Signed)
Patient checking on the status of message and wanted PCP to know she is taking clopidogrel (PLAVIX) 75 MG tablet which is a blood thinner, patient would like a follow up call today best # [phone]

## 2021-03-13 NOTE — Telephone Encounter (Signed)
Copied from [place] [id number]. Topic: General - Other >> Mar 13, 2021 11:56 AM Pawlus, Brayton Layman A wrote: Reason for CRM: Pt called in stating she has upcoming surgery and wanted to know if she is supposed to stop taking her blood thinners. Please advise.

## 2021-03-16 ENCOUNTER — Encounter: Payer: Self-pay | Admitting: Family Medicine

## 2021-03-17 NOTE — Progress Notes (Signed)
Interpreted by me on 03/12/21. NSR at 86 bpm, no ST segment changes

## 2021-03-18 ENCOUNTER — Other Ambulatory Visit: Payer: Self-pay

## 2021-03-18 DIAGNOSIS — Z1231 Encounter for screening mammogram for malignant neoplasm of breast: Secondary | ICD-10-CM

## 2021-05-21 ENCOUNTER — Ambulatory Visit (INDEPENDENT_AMBULATORY_CARE_PROVIDER_SITE_OTHER): Payer: 59 | Admitting: Family Medicine

## 2021-05-21 ENCOUNTER — Other Ambulatory Visit: Payer: Self-pay

## 2021-05-21 ENCOUNTER — Encounter: Payer: Self-pay | Admitting: Family Medicine

## 2021-05-21 VITALS — BP 110/76 | HR 101 | Temp 98.5°F | Ht 62.76 in | Wt 209.2 lb

## 2021-05-21 DIAGNOSIS — J441 Chronic obstructive pulmonary disease with (acute) exacerbation: Secondary | ICD-10-CM | POA: Diagnosis not present

## 2021-05-21 DIAGNOSIS — G4700 Insomnia, unspecified: Secondary | ICD-10-CM | POA: Diagnosis not present

## 2021-05-21 DIAGNOSIS — Z23 Encounter for immunization: Secondary | ICD-10-CM | POA: Diagnosis not present

## 2021-05-21 MED ORDER — PREDNISONE 10 MG PO TABS: ORAL_TABLET | ORAL | 0 refills | Status: DC

## 2021-05-21 MED ORDER — ALBUTEROL SULFATE (2.5 MG/3ML) 0.083% IN NEBU: 2.5000 mg | INHALATION_SOLUTION | Freq: Once | RESPIRATORY_TRACT | Status: AC

## 2021-05-21 MED ORDER — TRAZODONE HCL 150 MG PO TABS: 150.0000 mg | ORAL_TABLET | Freq: Every evening | ORAL | 1 refills | Status: DC | PRN

## 2021-05-21 MED ORDER — AZITHROMYCIN 250 MG PO TABS: ORAL_TABLET | ORAL | 0 refills | Status: AC

## 2021-05-21 NOTE — Assessment & Plan Note (Signed)
Will increase her trazodone to '150mg'$ . Call with any concerns or if not getting better. Follow up 2 weeks at physical.

## 2021-05-21 NOTE — Progress Notes (Signed)
BP 110/76   Pulse (!) 101   Temp 98.5 F (36.9 C) (Oral)   Ht 5' 2.76" (1.594 m)   Wt 209 lb 3.2 oz (94.9 kg)   SpO2 98%   BMI 37.35 kg/m    Subjective:    Patient ID: Meagan York, female    DOB: 11/11/1955, 65 y.o.   MRN: [id number]  HPI: Meagan York is a 65 y.o. female  Chief Complaint  Patient presents with   COPD    Having a COPD flare   Insomnia    Has not been able to sleep lately   COPD COPD status: exacerbated x 2weeks Satisfied with current treatment?: no Oxygen use: no Dyspnea frequency: 2x a day Cough frequency: several times a day Rescue inhaler frequency:  several times a day Limitation of activity: yes Productive cough: no Pneumovax:  pneumovax done today Influenza:  declined  INSOMNIA Duration:  since her surgery Satisfied with sleep quality: no Difficulty falling asleep: yes Difficulty staying asleep: no Waking a few hours after sleep onset: no Early morning awakenings: no Daytime hypersomnolence: no Wakes feeling refreshed: no Good sleep hygiene: yes Apnea: no Snoring: no Depressed/anxious mood: no Recent stress: yes Restless legs/nocturnal leg cramps: no Chronic pain/arthritis: no History of sleep study: no Treatments attempted:  trazodone, melatonin, uinsom, and benadryl    Relevant past medical, surgical, family and social history reviewed and updated as indicated. Interim medical history since our last visit reviewed. Allergies and medications reviewed and updated.  Review of Systems  Constitutional: Negative.   Respiratory: Negative.    Cardiovascular: Negative.   Gastrointestinal: Negative.   Musculoskeletal: Negative.   Neurological: Negative.   Psychiatric/Behavioral: Negative.     Per HPI unless specifically indicated above     Objective:    BP 110/76   Pulse (!) 101   Temp 98.5 F (36.9 C) (Oral)   Ht 5' 2.76" (1.594 m)   Wt 209 lb 3.2 oz (94.9 kg)   SpO2 98%   BMI 37.35 kg/m   Wt Readings from Last 3  Encounters:  05/21/21 209 lb 3.2 oz (94.9 kg)  03/12/21 209 lb 12.8 oz (95.2 kg)  02/16/21 203 lb 3.2 oz (92.2 kg)    Physical Exam Vitals and nursing note reviewed.  Constitutional:      General: She is not in acute distress.    Appearance: Normal appearance. She is not ill-appearing, toxic-appearing or diaphoretic.  HENT:     Head: Normocephalic and atraumatic.     Right Ear: External ear normal.     Left Ear: External ear normal.     Nose: Nose normal.     Mouth/Throat:     Mouth: Mucous membranes are moist.     Pharynx: Oropharynx is clear.  Eyes:     General: No scleral icterus.       Right eye: No discharge.        Left eye: No discharge.     Extraocular Movements: Extraocular movements intact.     Conjunctiva/sclera: Conjunctivae normal.     Pupils: Pupils are equal, round, and reactive to light.  Cardiovascular:     Rate and Rhythm: Normal rate and regular rhythm.     Pulses: Normal pulses.     Heart sounds: Normal heart sounds. No murmur heard.   No friction rub. No gallop.  Pulmonary:     Effort: Pulmonary effort is normal. No respiratory distress.     Breath sounds: No stridor. Wheezing present.  No rhonchi or rales.  Chest:     Chest wall: No tenderness.  Musculoskeletal:        General: Normal range of motion.     Cervical back: Normal range of motion and neck supple.  Skin:    General: Skin is warm and dry.     Capillary Refill: Capillary refill takes less than 2 seconds.     Coloration: Skin is not jaundiced or pale.     Findings: No bruising, erythema, lesion or rash.  Neurological:     General: No focal deficit present.     Mental Status: She is alert and oriented to person, place, and time. Mental status is at baseline.  Psychiatric:        Mood and Affect: Mood normal.        Behavior: Behavior normal.        Thought Content: Thought content normal.        Judgment: Judgment normal.    Results for orders placed or performed in visit on 03/12/21   Microscopic Examination   Urine  Result Value Ref Range   WBC, UA 0-5 0 - 5 /hpf   RBC 0-2 0 - 2 /hpf   Epithelial Cells (non renal) 0-10 0 - 10 /hpf   Casts Present (A) None seen /lpf   Cast Type Hyaline casts N/A   Mucus, UA Present Not Estab.   Bacteria, UA None seen None seen/Few  Comprehensive metabolic panel  Result Value Ref Range   Glucose 75 65 - 99 mg/dL   BUN 7 (L) 8 - 27 mg/dL   Creatinine, Ser 0.71 0.57 - 1.00 mg/dL   eGFR 95 >59 mL/min/1.73   BUN/Creatinine Ratio 10 (L) 12 - 28   Sodium 141 134 - 144 mmol/L   Potassium 3.4 (L) 3.5 - 5.2 mmol/L   Chloride 100 96 - 106 mmol/L   CO2 26 20 - 29 mmol/L   Calcium 8.9 8.7 - 10.3 mg/dL   Total Protein 6.4 6.0 - 8.5 g/dL   Albumin 4.0 3.8 - 4.8 g/dL   Globulin, Total 2.4 1.5 - 4.5 g/dL   Albumin/Globulin Ratio 1.7 1.2 - 2.2   Bilirubin Total 0.4 0.0 - 1.2 mg/dL   Alkaline Phosphatase 116 44 - 121 IU/L   AST 19 0 - 40 IU/L   ALT 23 0 - 32 IU/L  CBC with Differential/Platelet  Result Value Ref Range   WBC 8.1 3.4 - 10.8 x10E3/uL   RBC 4.13 3.77 - 5.28 x10E6/uL   Hemoglobin 12.1 11.1 - 15.9 g/dL   Hematocrit 36.7 34.0 - 46.6 %   MCV 89 79 - 97 fL   MCH 29.3 26.6 - 33.0 pg   MCHC 33.0 31.5 - 35.7 g/dL   RDW 11.9 11.7 - 15.4 %   Platelets 284 150 - 450 x10E3/uL   Neutrophils 58 Not Estab. %   Lymphs 29 Not Estab. %   Monocytes 9 Not Estab. %   Eos 2 Not Estab. %   Basos 1 Not Estab. %   Neutrophils Absolute 4.7 1.4 - 7.0 x10E3/uL   Lymphocytes Absolute 2.4 0.7 - 3.1 x10E3/uL   Monocytes Absolute 0.7 0.1 - 0.9 x10E3/uL   EOS (ABSOLUTE) 0.2 0.0 - 0.4 x10E3/uL   Basophils Absolute 0.1 0.0 - 0.2 x10E3/uL   Immature Granulocytes 1 Not Estab. %   Immature Grans (Abs) 0.0 0.0 - 0.1 x10E3/uL  CoaguChek XS/INR Waived  Result Value Ref Range   INR 0.9 0.9 -  1.1   Prothrombin Time 10.8 sec  Urinalysis, Routine w reflex microscopic  Result Value Ref Range   Specific Gravity, UA 1.025 1.005 - 1.030   pH, UA 6.0 5.0 -  7.5   Color, UA Yellow Yellow   Appearance Ur Clear Clear   Leukocytes,UA Negative Negative   Protein,UA Negative Negative/Trace   Glucose, UA Negative Negative   Ketones, UA Trace (A) Negative   RBC, UA Trace (A) Negative   Bilirubin, UA Negative Negative   Urobilinogen, Ur 0.2 0.2 - 1.0 mg/dL   Nitrite, UA Negative Negative   Microscopic Examination See below:   Bayer DCA Hb A1c Waived  Result Value Ref Range   HB A1C (BAYER DCA - WAIVED) 5.7 <7.0 %      Assessment & Plan:   Problem List Items Addressed This Visit       Other   Insomnia    Will increase her trazodone to 171m. Call with any concerns or if not getting better. Follow up 2 weeks at physical.      Other Visit Diagnoses     COPD exacerbation ([place]    -  Primary   Will treat with prednisone and azithromycin. Recheck lungs 2 weeks. Call with any concerns.    Relevant Medications   albuterol (PROVENTIL) (2.5 MG/3ML) 0.083% nebulizer solution 2.5 mg        Follow up plan: Return in about 2 weeks (around 06/04/2021) for physical.

## 2021-06-03 ENCOUNTER — Ambulatory Visit: Payer: 59 | Admitting: Family Medicine

## 2021-06-16 ENCOUNTER — Other Ambulatory Visit: Payer: Self-pay | Admitting: Family Medicine

## 2021-06-18 ENCOUNTER — Ambulatory Visit: Payer: 59 | Admitting: Family Medicine

## 2021-07-11 ENCOUNTER — Other Ambulatory Visit: Payer: Self-pay | Admitting: Family Medicine

## 2021-07-11 NOTE — Telephone Encounter (Signed)
last RF 02/16/21 #30 5 RF

## 2021-07-11 NOTE — Telephone Encounter (Signed)
last RF 05/21/21 #90 1 RF

## 2021-07-11 NOTE — Telephone Encounter (Signed)
Requested Prescriptions  Pending Prescriptions Disp Refills  . atorvastatin (LIPITOR) 40 MG tablet [Pharmacy Med Name: ATORVASTATIN 40 MG TABLET] 90 tablet 1    Sig: TAKE 1 TABLET BY MOUTH DAILY AT 6 PM.     Cardiovascular:  Antilipid - Statins Passed - 07/11/2021  2:32 PM      Passed - Total Cholesterol in normal range and within 360 days    Cholesterol, Total  Date Value Ref Range Status  02/16/2021 170 100 - 199 mg/dL Final         Passed - LDL in normal range and within 360 days    LDL Chol Calc (NIH)  Date Value Ref Range Status  02/16/2021 57 0 - 99 mg/dL Final         Passed - HDL in normal range and within 360 days    HDL  Date Value Ref Range Status  02/16/2021 89 >39 mg/dL Final         Passed - Triglycerides in normal range and within 360 days    Triglycerides  Date Value Ref Range Status  02/16/2021 149 0 - 149 mg/dL Final         Passed - Patient is not pregnant      Passed - Valid encounter within last 12 months    Recent Outpatient Visits          1 month ago COPD exacerbation ([place])   [place], Megan P, DO   4 months ago Preop exam for internal medicine   [facility] [place], Megan P, DO   4 months ago Acute pain of left shoulder   [facility] [place], Megan P, DO   7 months ago Seasonal allergic rhinitis due to pollen   [facility], Megan P, DO   7 months ago Seasonal allergic rhinitis due to pollen   [facility], Barb Merino, DO      Future Appointments            In 1 month Wynetta Emery, Barb Merino, DO [employer], [facility]

## 2021-08-13 ENCOUNTER — Other Ambulatory Visit: Payer: Self-pay | Admitting: Family Medicine

## 2021-08-14 NOTE — Telephone Encounter (Signed)
Requested medication (s) are due for refill today: NO, filled 11/3 for 90 day supply  Requested medication (s) are on the active medication list: yes  Last refill:  07/16/21  Future visit scheduled: 08/20/21  Notes to clinic:  got 6 month rx in Sept has filled twice already, requesting early, please assess.      Requested Prescriptions  Pending Prescriptions Disp Refills   traZODone (DESYREL) 150 MG tablet [Pharmacy Med Name: TRAZODONE 150 MG TABLET] 90 tablet 2    Sig: Take 1 tablet (150 mg total) by mouth at bedtime as needed for sleep.     Psychiatry: Antidepressants - Serotonin Modulator Passed - 08/13/2021  8:31 AM      Passed - Completed PHQ-2 or PHQ-9 in the last 360 days      Passed - Valid encounter within last 6 months    Recent Outpatient Visits           2 months ago COPD exacerbation ([place])   [place], Megan P, DO   5 months ago Preop exam for internal medicine   [place], Megan P, DO   5 months ago Acute pain of left shoulder   [place], Megan P, DO   8 months ago Seasonal allergic rhinitis due to pollen   [facility], Megan P, DO   8 months ago Seasonal allergic rhinitis due to pollen   [facility], Barb Merino, DO       Future Appointments             In 6 days Wynetta Emery, Barb Merino, DO [employer], [facility]

## 2021-08-16 ENCOUNTER — Other Ambulatory Visit: Payer: Self-pay | Admitting: Family Medicine

## 2021-08-16 NOTE — Telephone Encounter (Signed)
Requested Prescriptions  Pending Prescriptions Disp Refills  . telmisartan (MICARDIS) 80 MG tablet [Pharmacy Med Name: TELMISARTAN 80 MG TABLET] 30 tablet 0    Sig: TAKE 1 TABLET BY MOUTH EVERY DAY     Cardiovascular:  Angiotensin Receptor Blockers Failed - 08/16/2021 11:31 AM      Failed - K in normal range and within 180 days    Potassium  Date Value Ref Range Status  03/12/2021 3.4 (L) 3.5 - 5.2 mmol/L Final  06/19/2014 3.1 (L) 3.5 - 5.1 mmol/L Final         Passed - Cr in normal range and within 180 days    Creatinine  Date Value Ref Range Status  06/19/2014 0.73 0.60 - 1.30 mg/dL Final   Creatinine, Ser  Date Value Ref Range Status  03/12/2021 0.71 0.57 - 1.00 mg/dL Final         Passed - Patient is not pregnant      Passed - Last BP in normal range    BP Readings from Last 1 Encounters:  05/21/21 110/76         Passed - Valid encounter within last 6 months    Recent Outpatient Visits          2 months ago COPD exacerbation ([place])   [place], Megan P, DO   5 months ago Preop exam for internal medicine   [facility] [place], Megan P, DO   6 months ago Acute pain of left shoulder   [place], Megan P, DO   8 months ago Seasonal allergic rhinitis due to pollen   [facility] Megan P, DO   8 months ago Seasonal allergic rhinitis due to pollen   [facility], Barb Merino, DO      Future Appointments            In 4 days Wynetta Emery, Barb Merino, DO [employer], [place]

## 2021-08-20 ENCOUNTER — Encounter: Payer: 59 | Admitting: Family Medicine

## 2021-09-15 ENCOUNTER — Other Ambulatory Visit: Payer: Self-pay | Admitting: Family Medicine

## 2021-09-16 NOTE — Telephone Encounter (Signed)
Refilled 05/21/2021 #90 with 1 refill. Medication should last until 11/2021. Requested Prescriptions  Pending Prescriptions Disp Refills   traZODone (DESYREL) 150 MG tablet [Pharmacy Med Name: TRAZODONE 150 MG TABLET] 90 tablet 2    Sig: TAKE 1 TABLET (150 MG TOTAL) BY MOUTH AT BEDTIME AS NEEDED FOR SLEEP.     Psychiatry: Antidepressants - Serotonin Modulator Passed - 09/15/2021 10:36 AM      Passed - Completed PHQ-2 or PHQ-9 in the last 360 days      Passed - Valid encounter within last 6 months    Recent Outpatient Visits          3 months ago COPD exacerbation ([place])   [place], Megan P, DO   6 months ago Preop exam for internal medicine   [place], Megan P, DO   7 months ago Acute pain of left shoulder   [place], Megan P, DO   9 months ago Seasonal allergic rhinitis due to pollen   [facility], Megan P, DO   9 months ago Seasonal allergic rhinitis due to pollen   [facility], Barb Merino, DO      Future Appointments            In 1 week Wynetta Emery, Barb Merino, DO [employer], [facility]

## 2021-09-16 NOTE — Telephone Encounter (Signed)
Requested Prescriptions  Pending Prescriptions Disp Refills   telmisartan (MICARDIS) 80 MG tablet [Pharmacy Med Name: TELMISARTAN 80 MG TABLET] 90 tablet 0    Sig: TAKE 1 TABLET BY MOUTH EVERY DAY     Cardiovascular:  Angiotensin Receptor Blockers Failed - 09/15/2021 10:32 AM      Failed - Cr in normal range and within 180 days    Creatinine  Date Value Ref Range Status  06/19/2014 0.73 0.60 - 1.30 mg/dL Final   Creatinine, Ser  Date Value Ref Range Status  03/12/2021 0.71 0.57 - 1.00 mg/dL Final         Failed - K in normal range and within 180 days    Potassium  Date Value Ref Range Status  03/12/2021 3.4 (L) 3.5 - 5.2 mmol/L Final  06/19/2014 3.1 (L) 3.5 - 5.1 mmol/L Final         Passed - Patient is not pregnant      Passed - Last BP in normal range    BP Readings from Last 1 Encounters:  05/21/21 110/76         Passed - Valid encounter within last 6 months    Recent Outpatient Visits          3 months ago COPD exacerbation ([place])   [place], Megan P, DO   6 months ago Preop exam for internal medicine   [place], Megan P, DO   7 months ago Acute pain of left shoulder   [place], Megan P, DO   9 months ago Seasonal allergic rhinitis due to pollen   [facility], Megan P, DO   9 months ago Seasonal allergic rhinitis due to pollen   [facility], Barb Merino, DO      Future Appointments            In 1 week Wynetta Emery, Barb Merino, DO [employer], [facility]

## 2021-09-19 ENCOUNTER — Other Ambulatory Visit: Payer: Self-pay | Admitting: Family Medicine

## 2021-09-19 NOTE — Telephone Encounter (Signed)
Requested Prescriptions  Pending Prescriptions Disp Refills   buPROPion (WELLBUTRIN XL) 300 MG 24 hr tablet [Pharmacy Med Name: BUPROPION HCL XL 300 MG TABLET] 30 tablet 2    Sig: TAKE 1 TABLET BY MOUTH EVERY DAY     Psychiatry: Antidepressants - bupropion Passed - 09/19/2021  9:17 AM      Passed - Completed PHQ-2 or PHQ-9 in the last 360 days      Passed - Last BP in normal range    BP Readings from Last 1 Encounters:  05/21/21 110/76         Passed - Valid encounter within last 6 months    Recent Outpatient Visits          4 months ago COPD exacerbation ([place])   [place], Megan P, DO   6 months ago Preop exam for internal medicine   [facility] [place], Megan P, DO   7 months ago Acute pain of left shoulder   [place], Megan P, DO   9 months ago Seasonal allergic rhinitis due to pollen   [facility], Megan P, DO   9 months ago Seasonal allergic rhinitis due to pollen   [facility], Megan P, DO      Future Appointments            In 6 days [place], Megan P, DO [place]            amLODipine ([place]) 5 MG tablet [employer] Med Name: AMLODIPINE BESYLATE 5 MG TAB] 30 tablet 2    Sig: TAKE 1 TABLET BY MOUTH EVERY DAY     Cardiovascular:  Calcium Channel Blockers Passed - 09/19/2021  9:17 AM      Passed - Last BP in normal range    BP Readings from Last 1 Encounters:  05/21/21 110/76         Passed - Valid encounter within last 6 months    Recent Outpatient Visits          4 months ago COPD exacerbation ([place])   [place], Megan P, DO   6 months ago Preop exam for internal medicine   [place], Megan P, DO   7 months ago Acute pain of left shoulder   [place], Megan P, DO   9 months ago Seasonal allergic rhinitis due to pollen   [facility], Megan P, DO    9 months ago Seasonal allergic rhinitis due to pollen   [facility], Barb Merino, DO      Future Appointments            In 6 days Wynetta Emery, Barb Merino, DO [employer]

## 2021-09-25 ENCOUNTER — Emergency Department: Payer: 59

## 2021-09-25 ENCOUNTER — Encounter: Payer: 59 | Admitting: Family Medicine

## 2021-09-25 DIAGNOSIS — Z20822 Contact with and (suspected) exposure to covid-19: Secondary | ICD-10-CM | POA: Diagnosis not present

## 2021-09-25 DIAGNOSIS — I10 Essential (primary) hypertension: Secondary | ICD-10-CM | POA: Insufficient documentation

## 2021-09-25 DIAGNOSIS — R079 Chest pain, unspecified: Secondary | ICD-10-CM | POA: Diagnosis present

## 2021-09-25 DIAGNOSIS — J441 Chronic obstructive pulmonary disease with (acute) exacerbation: Secondary | ICD-10-CM | POA: Diagnosis not present

## 2021-09-25 LAB — COMPREHENSIVE METABOLIC PANEL
ALT: 21 U/L (ref 0–44)
AST: 21 U/L (ref 15–41)
Albumin: 3.4 g/dL — ABNORMAL LOW (ref 3.5–5.0)
Alkaline Phosphatase: 93 U/L (ref 38–126)
Anion gap: 5 (ref 5–15)
BUN: 6 mg/dL — ABNORMAL LOW (ref 8–23)
CO2: 32 mmol/L (ref 22–32)
Calcium: 8.4 mg/dL — ABNORMAL LOW (ref 8.9–10.3)
Chloride: 99 mmol/L (ref 98–111)
Creatinine, Ser: 0.97 mg/dL (ref 0.44–1.00)
GFR, Estimated: >60 mL/min (ref >60)
Glucose, Bld: 91 mg/dL (ref 70–99)
Potassium: 3 mmol/L — ABNORMAL LOW (ref 3.5–5.1)
Sodium: 136 mmol/L (ref 135–145)
Total Bilirubin: 0.8 mg/dL (ref 0.3–1.2)
Total Protein: 7.1 g/dL (ref 6.5–8.1)

## 2021-09-25 LAB — CBC WITH DIFFERENTIAL/PLATELET
Abs Immature Granulocytes: 0.02 10*3/uL (ref 0.00–0.07)
Basophils Absolute: 0 10*3/uL (ref 0.0–0.1)
Basophils Relative: 1 %
Eosinophils Absolute: 0.3 10*3/uL (ref 0.0–0.5)
Eosinophils Relative: 6 %
HCT: 35.2 % — ABNORMAL LOW (ref 36.0–46.0)
Hemoglobin: 11.2 g/dL — ABNORMAL LOW (ref 12.0–15.0)
Immature Granulocytes: 0 %
Lymphocytes Relative: 11 %
Lymphs Abs: 0.6 10*3/uL — ABNORMAL LOW (ref 0.7–4.0)
MCH: 28.3 pg (ref 26.0–34.0)
MCHC: 31.8 g/dL (ref 30.0–36.0)
MCV: 88.9 fL (ref 80.0–100.0)
Monocytes Absolute: 0.6 10*3/uL (ref 0.1–1.0)
Monocytes Relative: 11 %
Neutro Abs: 4 10*3/uL (ref 1.7–7.7)
Neutrophils Relative %: 71 %
Platelets: 244 10*3/uL (ref 150–400)
RBC: 3.96 MIL/uL (ref 3.87–5.11)
RDW: 13.2 % (ref 11.5–15.5)
WBC: 5.5 10*3/uL (ref 4.0–10.5)
nRBC: 0 % (ref 0.0–0.2)

## 2021-09-25 LAB — URINALYSIS, ROUTINE W REFLEX MICROSCOPIC
Bilirubin Urine: NEGATIVE
Glucose, UA: NEGATIVE mg/dL
Hgb urine dipstick: NEGATIVE
Ketones, ur: NEGATIVE mg/dL
Leukocytes,Ua: NEGATIVE
Nitrite: NEGATIVE
Protein, ur: NEGATIVE mg/dL
Specific Gravity, Urine: <1.005 — ABNORMAL LOW (ref 1.005–1.030)
pH: 6.5 (ref 5.0–8.0)

## 2021-09-25 LAB — TROPONIN I (HIGH SENSITIVITY): Troponin I (High Sensitivity): <2 ng/L (ref <18)

## 2021-09-25 MED ORDER — IPRATROPIUM-ALBUTEROL 0.5-2.5 (3) MG/3ML IN SOLN
3.0000 mL | Freq: Once | RESPIRATORY_TRACT | Status: AC
  Administered 2021-09-25: 3 mL via RESPIRATORY_TRACT
  Filled 2021-09-25: qty 3

## 2021-09-25 NOTE — ED Triage Notes (Signed)
Pt presents via POV with complaints of left sided chest pain that started 3 days ago and has progressively gotten worse. Pt endorses SOB - has a hx of COPD and uses inhalers intermittently - with last use being yesterday.

## 2021-09-26 ENCOUNTER — Emergency Department
Admission: EM | Admit: 2021-09-26 | Discharge: 2021-09-26 | Disposition: A | Discharge status: Home / Self Care | Payer: 59 | Attending: Emergency Medicine | Admitting: Emergency Medicine

## 2021-09-26 DIAGNOSIS — R0789 Other chest pain: Secondary | ICD-10-CM

## 2021-09-26 DIAGNOSIS — J441 Chronic obstructive pulmonary disease with (acute) exacerbation: Secondary | ICD-10-CM

## 2021-09-26 LAB — RESP PANEL BY RT-PCR (FLU A&B, COVID) ARPGX2
Influenza A by PCR: NEGATIVE
Influenza B by PCR: NEGATIVE
SARS Coronavirus 2 by RT PCR: NEGATIVE

## 2021-09-26 LAB — TROPONIN I (HIGH SENSITIVITY): Troponin I (High Sensitivity): <2 ng/L (ref <18)

## 2021-09-26 MED ORDER — IPRATROPIUM-ALBUTEROL 0.5-2.5 (3) MG/3ML IN SOLN
3.0000 mL | Freq: Once | RESPIRATORY_TRACT | Status: AC
  Administered 2021-09-26: 3 mL via RESPIRATORY_TRACT
  Filled 2021-09-26: qty 3

## 2021-09-26 MED ORDER — PREDNISONE 20 MG PO TABS: 60.0000 mg | ORAL_TABLET | Freq: Every day | ORAL | 0 refills | Status: AC

## 2021-09-26 MED ORDER — PREDNISONE 20 MG PO TABS
60.0000 mg | ORAL_TABLET | Freq: Once | ORAL | Status: AC
  Administered 2021-09-26: 60 mg via ORAL
  Filled 2021-09-26: qty 3

## 2021-09-26 NOTE — ED Provider Notes (Signed)
[facility] Provider Note    Event Date/Time   First MD Initiated Contact with Patient 09/26/21 1145     (approximate)   History   Chest Pain   HPI  Meagan York is a 66 y.o. female with a history of COPD, hypertension, hyperlipidemia, CVA, and GERD who presents with chest pain over the last 2 days, intermittent course, described as a throbbing or pulsating sensation, and occurring in several different discrete spots in the left side of her chest.  She had 1 episode yesterday afternoon that lasted about 10 minutes and resolved.  The pain is now mostly improved.  She also has had some increased shortness of breath and has had to use her inhaler more frequently over the last few days.  She denies fever or chills.  She has had some cough.  She has no new leg swelling or pain.   Physical Exam   Triage Vital Signs: ED Triage Vitals  Enc Vitals Group     BP 09/25/21 2226 132/69     Pulse Rate 09/25/21 2226 88     Resp 09/25/21 2226 (!) 22     Temp 09/25/21 2226 98.3 F (36.8 C)     Temp Source 09/26/21 0037 Oral     SpO2 09/25/21 2226 97 %     Weight 09/25/21 2204 250 lb (113.4 kg)     Height 09/25/21 2204 5\' 2"  (1.575 m)     Head Circumference --      Peak Flow --      Pain Score 09/25/21 2204 6     Pain Loc --      Pain Edu? --      Excl. in [place]? --     Most recent vital signs: Vitals:   09/26/21 1258 09/26/21 1405  BP: 136/74 135/70  Pulse: 80 81  Resp: 17 17  Temp: 98.4 F (36.9 C) 98 F (36.7 C)  SpO2: 96% 96%     General: Awake, no distress.  CV:  Good peripheral perfusion.  Normal heart sounds. Resp:  Normal effort.  Scattered faint wheezes bilaterally. Abd:  No distention.  Other:  Trace edema to bilateral ankles.  No significant edema to the lower legs.  No calf or popliteal swelling or tenderness.   ED Results / Procedures / Treatments   Labs (all labs ordered are listed, but only abnormal results are displayed) Labs  Reviewed  CBC WITH DIFFERENTIAL/PLATELET - Abnormal; Notable for the following components:      Result Value   Hemoglobin 11.2 (*)    HCT 35.2 (*)    Lymphs Abs 0.6 (*)    All other components within normal limits  COMPREHENSIVE METABOLIC PANEL - Abnormal; Notable for the following components:   Potassium 3.0 (*)    BUN 6 (*)    Calcium 8.4 (*)    Albumin 3.4 (*)    All other components within normal limits  URINALYSIS, ROUTINE W REFLEX MICROSCOPIC - Abnormal; Notable for the following components:   Specific Gravity, Urine <1.005 (*)    All other components within normal limits  RESP PANEL BY RT-PCR (FLU A&B, COVID) [id number]  TROPONIN I (HIGH SENSITIVITY)  TROPONIN I (HIGH SENSITIVITY)     EKG  ED ECG REPORT I, Arta Silence, the attending physician, personally viewed and interpreted this ECG.  Date: 09/26/2021 EKG Time: 2207 Rate: 91 Rhythm: normal sinus rhythm QRS Axis: normal Intervals: normal ST/T Wave abnormalities: Nonspecific T wave abnormalities Narrative  Interpretation: Nonspecific abnormalities with no evidence of acute ischemia    RADIOLOGY  Chest x-ray interpreted by me shows no focal consolidation or edema; I reviewed the radiology report which confirms no acute abnormality  PROCEDURES:  Critical Care performed: No  Procedures   MEDICATIONS ORDERED IN ED: Medications  ipratropium-albuterol (DUONEB) 0.5-2.5 (3) MG/3ML nebulizer solution 3 mL (3 mLs Nebulization Given 09/25/21 2228)  ipratropium-albuterol (DUONEB) 0.5-2.5 (3) MG/3ML nebulizer solution 3 mL (3 mLs Nebulization Given 09/26/21 1218)  predniSONE (DELTASONE) tablet 60 mg (60 mg Oral Given 09/26/21 1218)     IMPRESSION / MDM / ASSESSMENT AND PLAN / ED COURSE  I reviewed the triage vital signs and the nursing notes.  66 year old female with a history of COPD and other PMH as noted above presents with atypical intermittent chest pain over the last few days which has now mostly resolved,  as well as increased shortness of breath and cough.  Exam reveals some scattered wheezes.  O2 saturation is in the mid 90s on room air.  The patient does not demonstrate any respiratory distress.  There is no significant peripheral edema.    EKG is nonischemic.  Chest x-ray shows no evidence of pneumonia.  I reviewed the lab work-up obtained from triage which is also unremarkable.  Troponins are negative x2.  Differential diagnosis includes, but is not limited to, musculoskeletal chest pain, GERD, COPD exacerbation, acute bronchitis, influenza, COVID-19, other viral respiratory infection.  There is no evidence of pneumonia given the lack of fever, leukocytosis, or x-ray findings.  There is no evidence of ACS given the intermittent and atypical pain, reassuring EKG, negative troponins x2.  There is no clinical evidence for DVT or PE.  There is no evidence of aortic dissection or other vascular etiology.  We will obtain a respiratory panel, give duo nebs and steroid and reassess.  ----------------------------------------- 2:08 PM on 09/26/2021 -----------------------------------------  The patient continues to appear well and states she is feeling better after bronchodilators and steroid.  Respiratory panel is negative.  The patient is stable for discharge home.  I counseled her on the results of the work-up.  Return precautions given, and she expresses understanding.   FINAL CLINICAL IMPRESSION(S) / ED DIAGNOSES   Final diagnoses:  Atypical chest pain  COPD exacerbation ([place])     Rx / DC Orders   ED Discharge Orders          Ordered    predniSONE (DELTASONE) 20 MG tablet  Daily with breakfast        09/26/21 1407             Note:  This document was prepared using Dragon voice recognition software and may include unintentional dictation errors.     Arta Silence, MD 09/26/21 1408

## 2021-09-26 NOTE — ED Notes (Signed)
Linus Orn, ED tech spoke with lab regarding 2nd Troponin result and was informed that there was only one patient identifier on label.

## 2021-09-26 NOTE — Discharge Instructions (Addendum)
Take the prednisone daily as prescribed starting tomorrow.  You have already received a dose today in the ED.  Use your albuterol every 4-6 hours for the next several days.  Return to the ER for new, worsening, or persistent severe chest pain, cough, difficulty breathing, fever, weakness, or any other new or worsening symptoms that concern you.

## 2021-10-22 ENCOUNTER — Other Ambulatory Visit: Payer: Self-pay | Admitting: Family Medicine

## 2021-10-22 NOTE — Telephone Encounter (Signed)
Patient will need an office visit for further refills. Requested Prescriptions  Pending Prescriptions Disp Refills   buPROPion (WELLBUTRIN XL) 300 MG 24 hr tablet [Pharmacy Med Name: BUPROPION HCL XL 300 MG TABLET] 90 tablet 0    Sig: TAKE 1 TABLET BY MOUTH EVERY DAY     Psychiatry: Antidepressants - bupropion Passed - 10/22/2021  2:32 PM      Passed - Cr in normal range and within 360 days    Creatinine  Date Value Ref Range Status  06/19/2014 0.73 0.60 - 1.30 mg/dL Final   Creatinine, Ser  Date Value Ref Range Status  09/25/2021 0.97 0.44 - 1.00 mg/dL Final         Passed - AST in normal range and within 360 days    AST  Date Value Ref Range Status  09/25/2021 21 15 - 41 U/L Final   SGOT(AST)  Date Value Ref Range Status  08/07/2012 42 (H) 15 - 37 Unit/L Final         Passed - ALT in normal range and within 360 days    ALT  Date Value Ref Range Status  09/25/2021 21 0 - 44 U/L Final   SGPT (ALT)  Date Value Ref Range Status  08/07/2012 48 12 - 78 U/L Final         Passed - Completed PHQ-2 or PHQ-9 in the last 360 days      Passed - Last BP in normal range    BP Readings from Last 1 Encounters:  09/26/21 135/70         Passed - Valid encounter within last 6 months    Recent Outpatient Visits          5 months ago COPD exacerbation ([place])   [place], Megan P, DO   7 months ago Preop exam for internal medicine   [facility] [place], Megan P, DO   8 months ago Acute pain of left shoulder   [facility] [place], Megan P, DO   10 months ago Seasonal allergic rhinitis due to pollen   [facility], Megan P, DO   10 months ago Seasonal allergic rhinitis due to pollen   [facility], [place], DO

## 2021-11-26 ENCOUNTER — Other Ambulatory Visit: Payer: Self-pay | Admitting: Family Medicine

## 2021-11-26 NOTE — Telephone Encounter (Signed)
Requested medication (s) are due for refill today:   No ? ?Requested medication (s) are on the active medication list:   No  Discontinued 09/26/2021 ? ?Future visit scheduled:   No ? ? ?Last ordered: Discontinued 09/26/2021 ? ?Returned because got a refill request for this even though it has been discontinued.    ? ?Requested Prescriptions  ?Pending Prescriptions Disp Refills  ? traZODone (DESYREL) 150 MG tablet [Pharmacy Med Name: TRAZODONE 150 MG TABLET] 30 tablet 5  ?  Sig: Take 1 tablet (150 mg total) by mouth at bedtime as needed for sleep.  ?  ? Psychiatry: Antidepressants - Serotonin Modulator Failed - 11/26/2021  1:47 AM  ?  ?  Failed - Valid encounter within last 6 months  ?  Recent Outpatient Visits   ? ?      ? 6 months ago COPD exacerbation ([place])  ? [place], DO  ? 8 months ago Preop exam for internal medicine  ? [place] P, DO  ? 9 months ago Acute pain of left shoulder  ? [place], Megan P, DO  ? 11 months ago Seasonal allergic rhinitis due to pollen  ? [place], Megan P, DO  ? 12 months ago Seasonal allergic rhinitis due to pollen  ? [place], DO  ? ?  ?  ? ?  ?  ?  Passed - Completed PHQ-2 or PHQ-9 in the last 360 days  ?  ?  ? ?

## 2021-12-11 ENCOUNTER — Other Ambulatory Visit: Payer: Self-pay | Admitting: Family Medicine

## 2021-12-14 ENCOUNTER — Ambulatory Visit: Payer: Self-pay

## 2021-12-14 NOTE — Telephone Encounter (Signed)
Pt called about refill for traZODone (DESYREL) 150 MG tablet and has schedule an appt for this week Thursday / please advise  ?

## 2021-12-14 NOTE — Telephone Encounter (Signed)
This med is not on current med list. ?Requested Prescriptions  ?Pending Prescriptions Disp Refills  ?? traZODone (DESYREL) 150 MG tablet [Pharmacy Med Name: TRAZODONE 150 MG TABLET] 30 tablet 5  ?  Sig: TAKE 1 TABLET (150 MG TOTAL) BY MOUTH AT BEDTIME AS NEEDED FOR SLEEP.  ?  ? Psychiatry: Antidepressants - Serotonin Modulator Failed - 12/14/2021  9:12 AM  ?  ?  Failed - Valid encounter within last 6 months  ?  Recent Outpatient Visits   ?      ? 6 months ago COPD exacerbation ([place])  ? [place], DO  ? 9 months ago Preop exam for internal medicine  ? [place] P, DO  ? 10 months ago Acute pain of left shoulder  ? [place], Megan P, DO  ? 1 year ago Seasonal allergic rhinitis due to pollen  ? [place], Megan P, DO  ? 1 year ago Seasonal allergic rhinitis due to pollen  ? [place], [place] P, DO  ?  ?  ?Future Appointments   ?        ? In 3 days Valerie Roys, DO [facility], [facility]  ?  ? ?  ?  ?  Passed - Completed PHQ-2 or PHQ-9 in the last 360 days  ?  ?  ? ? ? ?

## 2021-12-14 NOTE — Telephone Encounter (Signed)
? ?  Chief Complaint: Sinus pain, pressure ?Symptoms: Cough ?Frequency: Started 2-3 weeks ago ?Pertinent Negatives: Patient denies fever ?Disposition: '[]'$ ED /'[]'$ Urgent Care (no appt availability in office) / '[x]'$ Appointment(In office/virtual)/ '[]'$  [place] [facility]/ '[]'$ Home Care/ '[]'$ Refused Recommended Disposition /'[]'$ [place] [facility]/ '[]'$  Follow-up with PCP ?Additional Notes: Call back if symptoms worsen.  ? ?Reason for Disposition ? Lots of coughing ? ?Answer Assessment - Initial Assessment Questions ?1. LOCATION: "Where does it hurt?"  ?    Headache ?2. ONSET: "When did the sinus pain start?"  (e.g., hours, days)  ?    2-3 weeks ?3. SEVERITY: "How bad is the pain?"   (Scale 1-10; mild, moderate or severe) ?  - MILD (1-3): doesn't interfere with normal activities  ?  - MODERATE (4-7): interferes with normal activities (e.g., work or school) or awakens from sleep ?  - SEVERE (8-10): excruciating pain and patient unable to do any normal activities    ?    Moderate ?4. RECURRENT SYMPTOM: "Have you ever had sinus problems before?" If Yes, ask: "When was the last time?" and "What happened that time?"  ?    Yes ?5. NASAL CONGESTION: "Is the nose blocked?" If Yes, ask: "Can you open it or must you breathe through your mouth?" ?    Blocked ?6. NASAL DISCHARGE: "Do you have discharge from your nose?" If so ask, "What color?" ?    Clear ?7. FEVER: "Do you have a fever?" If Yes, ask: "What is it, how was it measured, and when did it start?"  ?    No ?8. OTHER SYMPTOMS: "Do you have any other symptoms?" (e.g., sore throat, cough, earache, difficulty breathing) ?    Cough ?9. PREGNANCY: "Is there any chance you are pregnant?" "When was your last menstrual period?" ?    No ? ?Protocols used: Sinus Pain or Congestion-A-AH ? ?

## 2021-12-17 ENCOUNTER — Ambulatory Visit: Payer: 59 | Admitting: Family Medicine

## 2021-12-17 ENCOUNTER — Encounter: Payer: Self-pay | Admitting: Family Medicine

## 2021-12-17 VITALS — BP 115/81 | HR 87 | Temp 98.3°F | Wt 197.6 lb

## 2021-12-17 DIAGNOSIS — E782 Mixed hyperlipidemia: Secondary | ICD-10-CM

## 2021-12-17 DIAGNOSIS — J301 Allergic rhinitis due to pollen: Secondary | ICD-10-CM

## 2021-12-17 DIAGNOSIS — J449 Chronic obstructive pulmonary disease, unspecified: Secondary | ICD-10-CM

## 2021-12-17 DIAGNOSIS — E6609 Other obesity due to excess calories: Secondary | ICD-10-CM

## 2021-12-17 DIAGNOSIS — E538 Deficiency of other specified B group vitamins: Secondary | ICD-10-CM

## 2021-12-17 DIAGNOSIS — E66811 Obesity, class 1: Secondary | ICD-10-CM

## 2021-12-17 DIAGNOSIS — I129 Hypertensive chronic kidney disease with stage 1 through stage 4 chronic kidney disease, or unspecified chronic kidney disease: Secondary | ICD-10-CM | POA: Diagnosis not present

## 2021-12-17 DIAGNOSIS — F331 Major depressive disorder, recurrent, moderate: Secondary | ICD-10-CM

## 2021-12-17 DIAGNOSIS — Z6833 Body mass index (BMI) 33.0-33.9, adult: Secondary | ICD-10-CM

## 2021-12-17 LAB — MICROALBUMIN, URINE WAIVED
Creatinine, Urine Waived: 50 mg/dL (ref 10–300)
Microalb, Ur Waived: 10 mg/L (ref 0–19)
Microalb/Creat Ratio: <30 mg/g (ref <30)

## 2021-12-17 LAB — URINALYSIS, ROUTINE W REFLEX MICROSCOPIC
Bilirubin, UA: NEGATIVE
Glucose, UA: NEGATIVE
Ketones, UA: NEGATIVE
Leukocytes,UA: NEGATIVE
Nitrite, UA: NEGATIVE
Protein,UA: NEGATIVE
RBC, UA: NEGATIVE
Specific Gravity, UA: <1.005 — ABNORMAL LOW (ref 1.005–1.030)
Urobilinogen, Ur: 0.2 mg/dL (ref 0.2–1.0)
pH, UA: 5.5 (ref 5.0–7.5)

## 2021-12-17 MED ORDER — BREZTRI AEROSPHERE 160-9-4.8 MCG/ACT IN AERO: 2.0000 | INHALATION_SPRAY | Freq: Two times a day (BID) | RESPIRATORY_TRACT | 11 refills | Status: AC

## 2021-12-17 MED ORDER — BUPROPION HCL ER (XL) 300 MG PO TB24: 300.0000 mg | ORAL_TABLET | Freq: Every day | ORAL | 1 refills | Status: AC

## 2021-12-17 MED ORDER — AMLODIPINE BESYLATE 5 MG PO TABS: 5.0000 mg | ORAL_TABLET | Freq: Every day | ORAL | 1 refills | Status: AC

## 2021-12-17 MED ORDER — ATORVASTATIN CALCIUM 40 MG PO TABS: ORAL_TABLET | ORAL | 1 refills | Status: AC

## 2021-12-17 MED ORDER — TELMISARTAN 80 MG PO TABS: 80.0000 mg | ORAL_TABLET | Freq: Every day | ORAL | 1 refills | Status: AC

## 2021-12-17 MED ORDER — ALBUTEROL SULFATE HFA 108 (90 BASE) MCG/ACT IN AERS: 1.0000 | INHALATION_SPRAY | RESPIRATORY_TRACT | 3 refills | Status: DC | PRN

## 2021-12-17 MED ORDER — CLOPIDOGREL BISULFATE 75 MG PO TABS: 75.0000 mg | ORAL_TABLET | Freq: Every day | ORAL | 3 refills | Status: AC

## 2021-12-17 MED ORDER — PREDNISONE 50 MG PO TABS: 50.0000 mg | ORAL_TABLET | Freq: Every day | ORAL | 0 refills | Status: DC

## 2021-12-17 NOTE — Assessment & Plan Note (Signed)
Acting up a bit. Does not want to change meds. Will continue current regimen and recheck at physical. Refills given.  ?

## 2021-12-17 NOTE — Progress Notes (Signed)
? ?BP 115/81   Pulse 87   Temp 98.3 ?F (36.8 ?C)   Wt 197 lb 9.6 oz (89.6 kg)   SpO2 99%   BMI 36.14 kg/m?   ? ?Subjective:  ? ? Patient ID: Meagan York, female    DOB: 1956/07/29, 66 y.o.   MRN: [id number] ? ?HPI: ?Meagan York is a 66 y.o. female ? ?Chief Complaint  ?Patient presents with  ? Sinusitis  ?  Patient states she has been wheezing, coughing, and facial pressure   ? Hyperlipidemia  ? Hypertension  ? Depression  ? ?HYPERTENSION / HYPERLIPIDEMIA ?Satisfied with current treatment? yes ?Duration of hypertension: chronic ?BP monitoring frequency: not checking ?BP medication side effects: no ?Past BP meds: micardis and amlodipine ?Duration of hyperlipidemia: chronic ?Cholesterol medication side effects: no ?Cholesterol supplements: none ?Past cholesterol medications: atorvastatin ?Medication compliance: fair compliance ?Aspirin: no ?Recent stressors: yes ?Recurrent headaches: no ?Visual changes: no ?Palpitations: no ?Dyspnea: no ?Chest pain: no ?Lower extremity edema: no ?Dizzy/lightheaded: yes ? ?DEPRESSION ?Mood status: worse ?Satisfied with current treatment?: yes ?Symptom severity: moderate  ?Duration of current treatment : chronic ?Side effects: no ?Medication compliance: fair compliance ?Psychotherapy/counseling: no  ?Previous psychiatric medications: wellbutrin ?Depressed mood: yes ?Anxious mood: no ?Anhedonia: no ?Significant weight loss or gain: no ?Insomnia: no  ?Fatigue: yes ?Feelings of worthlessness or guilt: yes ?Impaired concentration/indecisiveness: yes ?Suicidal ideations: no ?Hopelessness: no ?Crying spells: no ? ?  12/17/2021  ? 11:24 AM 05/21/2021  ?  3:41 PM 02/16/2021  ?  2:38 PM 08/18/2020  ?  9:28 AM 07/10/2020  ? 11:38 AM  ?Depression screen PHQ 2/9  ?Decreased Interest 2 0 1 0 3  ?Down, Depressed, Hopeless 1 0 3 0 3  ?PHQ - 2 Score 3 0 4 0 6  ?Altered sleeping 3 0 3 0 3  ?Tired, decreased energy 2 0 3 0 3  ?Change in appetite 1 0 '1 1 2  '$ ?Feeling bad or failure about yourself  1 0 1  0 3  ?Trouble concentrating 1 0 1 0 3  ?Moving slowly or fidgety/restless 0  0 0 2  ?Suicidal thoughts 0 0 0 0 0  ?PHQ-9 Score 11 0 '13 1 22  '$ ?Difficult doing work/chores   Not difficult at all  Extremely dIfficult  ? ?COPD ?COPD status: uncontrolled ?Satisfied with current treatment?: yes ?Oxygen use: no ?Dyspnea frequency: often ?Cough frequency: often ?Rescue inhaler frequency: several times a day   ?Limitation of activity: yes ?Productive cough: no ?Pneumovax: Up to Date ?Influenza: Not up to Date ? ?UPPER RESPIRATORY TRACT INFECTION ?Duration: 2 weeks ?Worst symptom: cough ?Fever: no ?Cough: yes ?Shortness of breath: yes ?Wheezing: yes ?Chest pain: no ?Chest tightness: no ?Chest congestion: no ?Nasal congestion: yes ?Runny nose: yes ?Post nasal drip: yes ?Sneezing: no ?Sore throat: no ?Swollen glands: no ?Sinus pressure: yes ?Headache: yes ?Face pain: no ?Toothache: yes ?Ear pain: no  ?Ear pressure: no  ?Eyes red/itching:yes ?Eye drainage/crusting: no  ?Vomiting: no ?Rash: no ?Fatigue: yes ?Sick contacts: no ?Strep contacts: no  ?Context: stable ?Recurrent sinusitis: no ?Relief with OTC cold/cough medications: no  ?Treatments attempted: tylenol sinus  ? ? ?Relevant past medical, surgical, family and social history reviewed and updated as indicated. Interim medical history since our last visit reviewed. ?Allergies and medications reviewed and updated. ? ?Review of Systems  ?Constitutional: Negative.   ?HENT:  Positive for congestion, postnasal drip, rhinorrhea, sinus pressure and sneezing. Negative for dental problem, drooling, ear discharge, ear pain, facial  swelling, hearing loss, mouth sores, nosebleeds, sinus pain, sore throat, tinnitus, trouble swallowing and voice change.   ?Eyes: Negative.   ?Respiratory:  Positive for cough and wheezing. Negative for apnea, choking, chest tightness, shortness of breath and stridor.   ?Cardiovascular: Negative.   ?Musculoskeletal: Negative.   ?Neurological: Negative.    ?Psychiatric/Behavioral: Negative.    ? ?Per HPI unless specifically indicated above ? ?   ?Objective:  ?  ?BP 115/81   Pulse 87   Temp 98.3 ?F (36.8 ?C)   Wt 197 lb 9.6 oz (89.6 kg)   SpO2 99%   BMI 36.14 kg/m?   ?Wt Readings from Last 3 Encounters:  ?12/17/21 197 lb 9.6 oz (89.6 kg)  ?09/25/21 250 lb (113.4 kg)  ?05/21/21 209 lb 3.2 oz (94.9 kg)  ?  ?Physical Exam ?Vitals and nursing note reviewed.  ?Constitutional:   ?   General: She is not in acute distress. ?   Appearance: Normal appearance. She is not ill-appearing, toxic-appearing or diaphoretic.  ?HENT:  ?   Head: Normocephalic and atraumatic.  ?   Right Ear: Tympanic membrane, ear canal and external ear normal.  ?   Left Ear: Tympanic membrane, ear canal and external ear normal.  ?   Nose: Nose normal. No congestion or rhinorrhea.  ?   Mouth/Throat:  ?   Mouth: Mucous membranes are moist.  ?   Pharynx: Oropharynx is clear. No oropharyngeal exudate or posterior oropharyngeal erythema.  ?Eyes:  ?   General: No scleral icterus.    ?   Right eye: No discharge.     ?   Left eye: No discharge.  ?   Extraocular Movements: Extraocular movements intact.  ?   Conjunctiva/sclera: Conjunctivae normal.  ?   Pupils: Pupils are equal, round, and reactive to light.  ?Cardiovascular:  ?   Rate and Rhythm: Normal rate and regular rhythm.  ?   Pulses: Normal pulses.  ?   Heart sounds: Normal heart sounds. No murmur heard. ?  No friction rub. No gallop.  ?Pulmonary:  ?   Effort: Pulmonary effort is normal. No respiratory distress.  ?   Breath sounds: Normal breath sounds. No stridor. No wheezing, rhonchi or rales.  ?Chest:  ?   Chest wall: No tenderness.  ?Musculoskeletal:     ?   General: Normal range of motion.  ?   Cervical back: Normal range of motion and neck supple.  ?Skin: ?   General: Skin is warm and dry.  ?   Capillary Refill: Capillary refill takes less than 2 seconds.  ?   Coloration: Skin is not jaundiced or pale.  ?   Findings: No bruising, erythema, lesion  or rash.  ?Neurological:  ?   General: No focal deficit present.  ?   Mental Status: She is alert and oriented to person, place, and time. Mental status is at baseline.  ?Psychiatric:     ?   Mood and Affect: Mood normal.     ?   Behavior: Behavior normal.     ?   Thought Content: Thought content normal.     ?   Judgment: Judgment normal.  ? ? ?Results for orders placed or performed during the hospital encounter of 09/26/21  ?Resp Panel by RT-PCR (Flu A&B, Covid) Nasopharyngeal Swab  ? Specimen: Nasopharyngeal Swab; Nasopharyngeal(NP) swabs in vial transport medium  ?Result Value Ref Range  ? SARS Coronavirus 2 by RT PCR NEGATIVE NEGATIVE  ? Influenza A by PCR  NEGATIVE NEGATIVE  ? Influenza B by PCR NEGATIVE NEGATIVE  ?CBC with Differential  ?Result Value Ref Range  ? WBC 5.5 4.0 - 10.5 K/uL  ? RBC 3.96 3.87 - 5.11 MIL/uL  ? Hemoglobin 11.2 (L) 12.0 - 15.0 g/dL  ? HCT 35.2 (L) 36.0 - 46.0 %  ? MCV 88.9 80.0 - 100.0 fL  ? MCH 28.3 26.0 - 34.0 pg  ? MCHC 31.8 30.0 - 36.0 g/dL  ? RDW 13.2 11.5 - 15.5 %  ? Platelets 244 150 - 400 K/uL  ? nRBC 0.0 0.0 - 0.2 %  ? Neutrophils Relative % 71 %  ? Neutro Abs 4.0 1.7 - 7.7 K/uL  ? Lymphocytes Relative 11 %  ? Lymphs Abs 0.6 (L) 0.7 - 4.0 K/uL  ? Monocytes Relative 11 %  ? Monocytes Absolute 0.6 0.1 - 1.0 K/uL  ? Eosinophils Relative 6 %  ? Eosinophils Absolute 0.3 0.0 - 0.5 K/uL  ? Basophils Relative 1 %  ? Basophils Absolute 0.0 0.0 - 0.1 K/uL  ? Immature Granulocytes 0 %  ? Abs Immature Granulocytes 0.02 0.00 - 0.07 K/uL  ?Comprehensive metabolic panel  ?Result Value Ref Range  ? Sodium 136 135 - 145 mmol/L  ? Potassium 3.0 (L) 3.5 - 5.1 mmol/L  ? Chloride 99 98 - 111 mmol/L  ? CO2 32 22 - 32 mmol/L  ? Glucose, Bld 91 70 - 99 mg/dL  ? BUN 6 (L) 8 - 23 mg/dL  ? Creatinine, Ser 0.97 0.44 - 1.00 mg/dL  ? Calcium 8.4 (L) 8.9 - 10.3 mg/dL  ? Total Protein 7.1 6.5 - 8.1 g/dL  ? Albumin 3.4 (L) 3.5 - 5.0 g/dL  ? AST 21 15 - 41 U/L  ? ALT 21 0 - 44 U/L  ? Alkaline Phosphatase 93 38  - 126 U/L  ? Total Bilirubin 0.8 0.3 - 1.2 mg/dL  ? GFR, Estimated >60 >60 mL/min  ? Anion gap 5 5 - 15  ?Urinalysis, Routine w reflex microscopic Urine, Clean Catch  ?Result Value Ref Range  ? Color, Urine YELLOW

## 2021-12-17 NOTE — Assessment & Plan Note (Signed)
Encouraged diet and exercise with goal of losing 1-2lbs per week.  

## 2021-12-17 NOTE — Assessment & Plan Note (Signed)
Under good control on current regimen. Continue current regimen. Continue to monitor. Call with any concerns. Refills given. Labs drawn today.   

## 2021-12-17 NOTE — Assessment & Plan Note (Signed)
Rechecking labs today. Await results. Treat as needed.  °

## 2021-12-17 NOTE — Assessment & Plan Note (Signed)
Lungs clear. Not using her preventative medicine. Will start breztri and recheck at physical.  ?

## 2021-12-17 NOTE — Patient Instructions (Signed)
Please call to schedule your mammogram: [facility]  Address: [address], [place], [place] [address] Phone: [phone]  

## 2021-12-18 ENCOUNTER — Telehealth: Payer: Self-pay | Admitting: Family Medicine

## 2021-12-18 ENCOUNTER — Other Ambulatory Visit: Payer: Self-pay | Admitting: Family Medicine

## 2021-12-18 LAB — COMPREHENSIVE METABOLIC PANEL
ALT: 27 IU/L (ref 0–32)
AST: 18 IU/L (ref 0–40)
Albumin/Globulin Ratio: 1.5 (ref 1.2–2.2)
Albumin: 4.3 g/dL (ref 3.8–4.8)
Alkaline Phosphatase: 105 IU/L (ref 44–121)
BUN/Creatinine Ratio: 7 — ABNORMAL LOW (ref 12–28)
BUN: 6 mg/dL — ABNORMAL LOW (ref 8–27)
Bilirubin Total: 0.6 mg/dL (ref 0.0–1.2)
CO2: 28 mmol/L (ref 20–29)
Calcium: 9.4 mg/dL (ref 8.7–10.3)
Chloride: 97 mmol/L (ref 96–106)
Creatinine, Ser: 0.84 mg/dL (ref 0.57–1.00)
Globulin, Total: 2.8 g/dL (ref 1.5–4.5)
Glucose: 88 mg/dL (ref 70–99)
Potassium: 2.9 mmol/L — ABNORMAL LOW (ref 3.5–5.2)
Sodium: 143 mmol/L (ref 134–144)
Total Protein: 7.1 g/dL (ref 6.0–8.5)
eGFR: 78 mL/min/{1.73_m2} (ref >59)

## 2021-12-18 LAB — CBC WITH DIFFERENTIAL/PLATELET
Basophils Absolute: 0.1 10*3/uL (ref 0.0–0.2)
Basos: 1 %
EOS (ABSOLUTE): 0.1 10*3/uL (ref 0.0–0.4)
Eos: 1 %
Hematocrit: 40.3 % (ref 34.0–46.6)
Hemoglobin: 13.1 g/dL (ref 11.1–15.9)
Immature Grans (Abs): 0 10*3/uL (ref 0.0–0.1)
Immature Granulocytes: 0 %
Lymphocytes Absolute: 2.8 10*3/uL (ref 0.7–3.1)
Lymphs: 23 %
MCH: 28.1 pg (ref 26.6–33.0)
MCHC: 32.5 g/dL (ref 31.5–35.7)
MCV: 87 fL (ref 79–97)
Monocytes Absolute: 1.4 10*3/uL — ABNORMAL HIGH (ref 0.1–0.9)
Monocytes: 12 %
Neutrophils Absolute: 7.6 10*3/uL — ABNORMAL HIGH (ref 1.4–7.0)
Neutrophils: 63 %
Platelets: 299 10*3/uL (ref 150–450)
RBC: 4.66 x10E6/uL (ref 3.77–5.28)
RDW: 14.6 % (ref 11.7–15.4)
WBC: 12 10*3/uL — ABNORMAL HIGH (ref 3.4–10.8)

## 2021-12-18 LAB — VITAMIN B12: Vitamin B-12: 1410 pg/mL — ABNORMAL HIGH (ref 232–1245)

## 2021-12-18 LAB — LIPID PANEL W/O CHOL/HDL RATIO
Cholesterol, Total: 161 mg/dL (ref 100–199)
HDL: 78 mg/dL (ref >39)
LDL Chol Calc (NIH): 61 mg/dL (ref 0–99)
Triglycerides: 126 mg/dL (ref 0–149)
VLDL Cholesterol Cal: 22 mg/dL (ref 5–40)

## 2021-12-18 LAB — TSH: TSH: 2.14 u[IU]/mL (ref 0.450–4.500)

## 2021-12-18 NOTE — Telephone Encounter (Signed)
Medication Refill - Medication: traZODone (DESYREL) 150 MG tablet ?ondansetron (ZOFRAN-ODT) 4 MG disintegrating tablet ? ?Has the patient contacted their pharmacy? No. ?Pt had appt yesterday and states she spoke to Dr Wynetta Emery about refilling these 2 meds along w/ her others.  These were the only 2 not done.  Pt was hoping the office was still open for Dr Wynetta Emery to get the message.  Advised I would send message anyway for her. ? ?Preferred Pharmacy (with phone number or street name): [id number] [place] [address] ?Has the patient been seen for an appointment in the last year OR does the patient have an upcoming appointment?  ?Yes.   ? ? ?

## 2021-12-18 NOTE — Telephone Encounter (Signed)
Copied from [place] [id number]. Topic: General - Other ?>> Dec 17, 2021  4:47 PM Pawlus, Brayton Layman A wrote: ?Reason for CRM: Pt called in to go over latest lab results, please advise. ?

## 2021-12-18 NOTE — Telephone Encounter (Signed)
Patient made aware of Provider's recommendations and verbalized understanding.   

## 2021-12-18 NOTE — Telephone Encounter (Signed)
Routing to provider to advise on labs. Have not yet been resulted.  ?

## 2021-12-18 NOTE — Telephone Encounter (Signed)
Results are in her mychart ?

## 2021-12-18 NOTE — Telephone Encounter (Signed)
Not all labs are back yet. I will send her a message when they are done. So far things look normal- needs to eat a banana daily for low potassium. Please remind her that I will send her a message when her labs are back, but they take some time to come back. Calling at [place] is not appropriate as I have not had time to review them.  ?

## 2021-12-21 NOTE — Telephone Encounter (Signed)
Requested medication (s) are due for refill today - unsure ? ?Requested medication (s) are on the active medication list -yes ? ?Future visit scheduled -yes ? ?Last refill: trazodone- 10/26/21 ?                 Ondansetron- 02/09/22 ? ?Notes to clinic: Request RF: Patient states she discussed with provider- outside provider/historical ? ?Requested Prescriptions  ?Pending Prescriptions Disp Refills  ? traZODone (DESYREL) 150 MG tablet    ?  Sig: Take 1 tablet (150 mg total) by mouth at bedtime as needed.  ?  ? Psychiatry: Antidepressants - Serotonin Modulator Passed - 12/18/2021  1:21 PM  ?  ?  Passed - Completed PHQ-2 or PHQ-9 in the last 360 days  ?  ?  Passed - Valid encounter within last 6 months  ?  Recent Outpatient Visits   ? ?      ? 4 days ago Benign hypertensive renal disease  ? [place], Megan P, DO  ? 7 months ago COPD exacerbation ([place])  ? [place], DO  ? 9 months ago Preop exam for internal medicine  ? [place] P, DO  ? 10 months ago Acute pain of left shoulder  ? [place], Megan P, DO  ? 1 year ago Seasonal allergic rhinitis due to pollen  ? [place], [place] P, DO  ? ?  ?  ?Future Appointments   ? ?        ? In 1 month Johnson, Megan P, DO [place], [facility]  ? ?  ? ?  ?  ?  ? ondansetron (ZOFRAN-ODT) 4 MG disintegrating tablet 20 tablet 0  ?  Sig: Take 1 tablet (4 mg total) by mouth every 8 (eight) hours as needed.  ?  ? Not Delegated - Gastroenterology: Antiemetics - ondansetron Failed - 12/18/2021  1:21 PM  ?  ?  Failed - This refill cannot be delegated  ?  ?  Passed - AST in normal range and within 360 days  ?  AST  ?Date Value Ref Range Status  ?12/17/2021 18 0 - 40 IU/L Final  ? ?SGOT(AST)  ?Date Value Ref Range Status  ?08/07/2012 42 (H) 15 - 37 Unit/L Final  ?  ?  ?  ?  Passed - ALT in normal range and within 360 days  ?  ALT  ?Date Value Ref Range Status   ?12/17/2021 27 0 - 32 IU/L Final  ? ?SGPT (ALT)  ?Date Value Ref Range Status  ?08/07/2012 48 12 - 78 U/L Final  ?  ?  ?  ?  Passed - Valid encounter within last 6 months  ?  Recent Outpatient Visits   ? ?      ? 4 days ago Benign hypertensive renal disease  ? [place], Megan P, DO  ? 7 months ago COPD exacerbation ([place])  ? [place], DO  ? 9 months ago Preop exam for internal medicine  ? [place] P, DO  ? 10 months ago Acute pain of left shoulder  ? [place], Megan P, DO  ? 1 year ago Seasonal allergic rhinitis due to pollen  ? [place], [place] P, DO  ? ?  ?  ?Future Appointments   ? ?        ? In 1  month Wynetta Emery, Barb Merino, DO [facility], [facility]  ? ?  ? ?  ?  ?  ? ? ? ?Requested Prescriptions  ?Pending Prescriptions Disp Refills  ? traZODone (DESYREL) 150 MG tablet    ?  Sig: Take 1 tablet (150 mg total) by mouth at bedtime as needed.  ?  ? Psychiatry: Antidepressants - Serotonin Modulator Passed - 12/18/2021  1:21 PM  ?  ?  Passed - Completed PHQ-2 or PHQ-9 in the last 360 days  ?  ?  Passed - Valid encounter within last 6 months  ?  Recent Outpatient Visits   ? ?      ? 4 days ago Benign hypertensive renal disease  ? [place], Megan P, DO  ? 7 months ago COPD exacerbation ([place])  ? [place], DO  ? 9 months ago Preop exam for internal medicine  ? [place] P, DO  ? 10 months ago Acute pain of left shoulder  ? [place], Megan P, DO  ? 1 year ago Seasonal allergic rhinitis due to pollen  ? [place], [place] P, DO  ? ?  ?  ?Future Appointments   ? ?        ? In 1 month Johnson, Megan P, DO [place], [facility]  ? ?  ? ?  ?  ?  ? ondansetron (ZOFRAN-ODT) 4 MG disintegrating tablet 20 tablet 0  ?  Sig: Take 1 tablet (4 mg total) by mouth  every 8 (eight) hours as needed.  ?  ? Not Delegated - Gastroenterology: Antiemetics - ondansetron Failed - 12/18/2021  1:21 PM  ?  ?  Failed - This refill cannot be delegated  ?  ?  Passed - AST in normal range and within 360 days  ?  AST  ?Date Value Ref Range Status  ?12/17/2021 18 0 - 40 IU/L Final  ? ?SGOT(AST)  ?Date Value Ref Range Status  ?08/07/2012 42 (H) 15 - 37 Unit/L Final  ?  ?  ?  ?  Passed - ALT in normal range and within 360 days  ?  ALT  ?Date Value Ref Range Status  ?12/17/2021 27 0 - 32 IU/L Final  ? ?SGPT (ALT)  ?Date Value Ref Range Status  ?08/07/2012 48 12 - 78 U/L Final  ?  ?  ?  ?  Passed - Valid encounter within last 6 months  ?  Recent Outpatient Visits   ? ?      ? 4 days ago Benign hypertensive renal disease  ? [place], Megan P, DO  ? 7 months ago COPD exacerbation ([place])  ? [place], DO  ? 9 months ago Preop exam for internal medicine  ? [place] P, DO  ? 10 months ago Acute pain of left shoulder  ? [place], Megan P, DO  ? 1 year ago Seasonal allergic rhinitis due to pollen  ? [place], [place] P, DO  ? ?  ?  ?Future Appointments   ? ?        ? In 1 month Johnson, Barb Merino, DO [facility], [facility]  ? ?  ? ?  ?  ?  ? ? ? ?

## 2022-01-25 ENCOUNTER — Other Ambulatory Visit: Payer: Self-pay | Admitting: Family Medicine

## 2022-01-26 NOTE — Telephone Encounter (Signed)
Requested medication (s) are due for refill today: Yes ? ?Requested medication (s) are on the active medication list: Yes ? ?Last refill:  10/26/20 ? ?Future visit scheduled: Yes ? ?Notes to clinic:  Historical provider. ? ? ? ?Requested Prescriptions  ?Pending Prescriptions Disp Refills  ? traZODone (DESYREL) 150 MG tablet [Pharmacy Med Name: TRAZODONE 150 MG TABLET] 30 tablet 5  ?  Sig: TAKE 1 TABLET (150 MG TOTAL) BY MOUTH AT BEDTIME AS NEEDED FOR SLEEP.  ?  ? Psychiatry: Antidepressants - Serotonin Modulator Passed - 01/25/2022 12:43 PM  ?  ?  Passed - Completed PHQ-2 or PHQ-9 in the last 360 days  ?  ?  Passed - Valid encounter within last 6 months  ?  Recent Outpatient Visits   ? ?      ? 1 month ago Benign hypertensive renal disease  ? [place], Megan P, DO  ? 8 months ago COPD exacerbation ([place])  ? [place], DO  ? 10 months ago Preop exam for internal medicine  ? [place] P, DO  ? 11 months ago Acute pain of left shoulder  ? [place], Megan P, DO  ? 1 year ago Seasonal allergic rhinitis due to pollen  ? [place], [place] P, DO  ? ?  ?  ?Future Appointments   ? ?        ? In 3 weeks Wynetta Emery, Barb Merino, DO [place], [facility]  ? ?  ? ? ?  ?  ?  ? ?

## 2022-02-16 ENCOUNTER — Ambulatory Visit: Payer: 59 | Admitting: Physician Assistant

## 2022-02-16 ENCOUNTER — Encounter: Payer: 59 | Admitting: Family Medicine

## 2022-02-18 ENCOUNTER — Other Ambulatory Visit: Payer: Self-pay | Admitting: Family Medicine

## 2022-02-18 NOTE — Telephone Encounter (Signed)
Requested medication (s) are due for refill today -yes  Requested medication (s) are on the active medication list -yes  Future visit scheduled -no  Last refill: 01/27/22 #30  Notes to clinic: medication was historical- provider fill #30 for upcoming appointment 6/6- canceled- sent for review of request- no future appointment scheduled   Requested Prescriptions  Pending Prescriptions Disp Refills   traZODone (DESYREL) 150 MG tablet [Pharmacy Med Name: TRAZODONE 150 MG TABLET] 90 tablet 1    Sig: TAKE 1 TABLET (150 MG TOTAL) BY MOUTH AT BEDTIME AS NEEDED FOR SLEEP.     Psychiatry: Antidepressants - Serotonin Modulator Passed - 02/18/2022 10:32 AM      Passed - Completed PHQ-2 or PHQ-9 in the last 360 days      Passed - Valid encounter within last 6 months    Recent Outpatient Visits           2 months ago Benign hypertensive renal disease   [facility] [place], Megan P, DO   9 months ago COPD exacerbation [facility])   [place], Megan P, DO   11 months ago Preop exam for internal medicine   [place], Megan P, DO   1 year ago Acute pain of left shoulder   [place], Megan P, DO   1 year ago Seasonal allergic rhinitis due to pollen   [facility], Megan P, DO                 Requested Prescriptions  Pending Prescriptions Disp Refills   traZODone (DESYREL) 150 MG tablet [Pharmacy Med Name: TRAZODONE 150 MG TABLET] 90 tablet 1    Sig: TAKE 1 TABLET (150 MG TOTAL) BY MOUTH AT BEDTIME AS NEEDED FOR SLEEP.     Psychiatry: Antidepressants - Serotonin Modulator Passed - 02/18/2022 10:32 AM      Passed - Completed PHQ-2 or PHQ-9 in the last 360 days      Passed - Valid encounter within last 6 months    Recent Outpatient Visits           2 months ago Benign hypertensive renal disease   [facility] [place], Megan P, DO   9 months ago COPD exacerbation [facility])    [place], Megan P, DO   11 months ago Preop exam for internal medicine   [place], Megan P, DO   1 year ago Acute pain of left shoulder   [place], Megan P, DO   1 year ago Seasonal allergic rhinitis due to pollen   [facility], [place], DO

## 2022-02-22 ENCOUNTER — Other Ambulatory Visit: Payer: Self-pay | Admitting: Family Medicine

## 2022-02-23 NOTE — Telephone Encounter (Signed)
Copied from previous refused request written by Valli Glance:  "Requested medication (s) are due for refill today -yes   Requested medication (s) are on the active medication list -yes   Future visit scheduled -no   Last refill: 01/27/22 #30   Notes to clinic: medication was historical- provider fill #30 for upcoming appointment 6/6- canceled- sent for review of request- no future appointment scheduled "  Medication was refused 5 days ago.   Requested Prescriptions  Pending Prescriptions Disp Refills   traZODone (DESYREL) 150 MG tablet [Pharmacy Med Name: TRAZODONE 150 MG TABLET] 30 tablet 0    Sig: TAKE 1 TABLET (150 MG TOTAL) BY MOUTH AT BEDTIME AS NEEDED FOR SLEEP.     Psychiatry: Antidepressants - Serotonin Modulator Passed - 02/22/2022  2:03 AM      Passed - Completed PHQ-2 or PHQ-9 in the last 360 days      Passed - Valid encounter within last 6 months    Recent Outpatient Visits           2 months ago Benign hypertensive renal disease   [facility] [place], Megan P, DO   9 months ago COPD exacerbation [facility])   [place], Megan P, DO   11 months ago Preop exam for internal medicine   [place], Megan P, DO   1 year ago Acute pain of left shoulder   [place], Megan P, DO   1 year ago Seasonal allergic rhinitis due to pollen   [facility], [place], DO

## 2023-09-16 ENCOUNTER — Ambulatory Visit: Payer: Self-pay

## 2023-09-16 NOTE — Telephone Encounter (Signed)
 Chief Complaint: Sore throat Symptoms: cough, sinus congestion w/green mucus when blow nose, chills, hard to swallow (5-6/10 severity), swollen lymph nodes Frequency: 3 days ago onset Pertinent Negatives: Patient denies other symptoms Disposition: [] ED /[] Urgent Care (no appt availability in office) / [] Appointment(In office/virtual)/ [x]  [facility]/ [] Home Care/ [] Refused Recommended Disposition /[] [facility]/ []  Follow-up with PCP Additional Notes: Patient called with c/o sore throat and requested a virtual urgent care visit, scheduled for tomorrow.     Summary: Low grade temp with fatifue and congestion advice   Pt is calling to report a low grad temp ( Unknown - no thermometer) with coughing, a fatigue, and contestion. Pt is interested in virtual appt.     Reason for Disposition  [1] Pus on tonsils (back of throat) AND [2]  fever AND [3] swollen neck lymph nodes (glands)  Answer Assessment - Initial Assessment Questions 1. ONSET: When did the throat start hurting? (Hours or days ago)      3 days ago 2. SEVERITY: How bad is the sore throat? (Scale 1-10; mild, moderate or severe)   - MILD (1-3):  Doesn't interfere with eating or normal activities.   - MODERATE (4-7): Interferes with eating some solids and normal activities.   - SEVERE (8-10):  Excruciating pain, interferes with most normal activities.   - SEVERE WITH DYSPHAGIA (10): Can't swallow liquids, drooling.     5-6 3. STREP EXPOSURE: Has there been any exposure to strep within the past week? If Yes, ask: What type of contact occurred?      No 4.  VIRAL SYMPTOMS: Are there any symptoms of a cold, such as a runny nose, cough, hoarse voice or red eyes?      Cough, sinus congestion, blow green mucus 5. FEVER: Do you have a fever? If Yes, ask: What is your temperature, how was it measured, and when did it start?     No thermometer 6. PUS ON THE TONSILS: Is there pus on the tonsils in the  back of your throat?     White stuff on it 7. OTHER SYMPTOMS: Do you have any other symptoms? (e.g., difficulty breathing, headache, rash)     Chills, body aches, little difficulty breathing  Protocols used: Sore Throat-A-AH

## 2023-09-17 ENCOUNTER — Telehealth: Payer: No Typology Code available for payment source | Admitting: Nurse Practitioner

## 2023-09-19 NOTE — Telephone Encounter (Signed)
 Called patient to follow up on sore throat and missed/no show virtual appt scheduled on 09-17-23 @ 10:15am. Left detailed message if patient still needed appointment to call office and schedule.

## 2024-09-28 ENCOUNTER — Ambulatory Visit: Payer: Self-pay

## 2024-09-28 ENCOUNTER — Ambulatory Visit (INDEPENDENT_AMBULATORY_CARE_PROVIDER_SITE_OTHER): Admitting: Internal Medicine

## 2024-09-28 ENCOUNTER — Encounter: Payer: Self-pay | Admitting: Internal Medicine

## 2024-09-28 VITALS — BP 138/88 | HR 79 | Ht 62.0 in | Wt 205.6 lb

## 2024-09-28 DIAGNOSIS — J441 Chronic obstructive pulmonary disease with (acute) exacerbation: Secondary | ICD-10-CM | POA: Diagnosis not present

## 2024-09-28 MED ORDER — ALBUTEROL SULFATE HFA 108 (90 BASE) MCG/ACT IN AERS: 1.0000 | INHALATION_SPRAY | RESPIRATORY_TRACT | 3 refills | Status: AC | PRN

## 2024-09-28 MED ORDER — AZITHROMYCIN 250 MG PO TABS: ORAL_TABLET | ORAL | 0 refills | Status: AC

## 2024-09-28 MED ORDER — PROMETHAZINE-DM 6.25-15 MG/5ML PO SYRP: 5.0000 mL | ORAL_SOLUTION | Freq: Four times a day (QID) | ORAL | 0 refills | Status: AC | PRN

## 2024-09-28 NOTE — Progress Notes (Signed)
 "  Subjective:    Patient ID: Meagan York, female    DOB: 25-Feb-1956, 69 y.o.   MRN: [id number]  HPI  Discussed the use of AI scribe software for clinical note transcription with the patient, who gave verbal consent to proceed.    Meagan York is a 69 year old female with COPD who presents with worsening respiratory symptoms.  Over the past week, she has experienced worsening respiratory symptoms, initially attributing them to sinus issues. Despite using sinus medications, her symptoms persisted, including nasal congestion and a runny nose with discharge that varies from clear to yellow-green. She reports that her symptoms have moved from her nose to her chest, and she has difficulty breathing, especially when lying down at night.  She has a history of COPD and quit smoking three years ago. She reports that weather changes affect her breathing, and for the past couple of years she has been doing well without needing inhalers. She is not currently using Breztri  or albuterol  inhalers, preferring to manage symptoms naturally. However, her breathing has become more challenging, particularly at night, and she experiences shortness of breath during the day when walking.  She has been experiencing chills and occasional nausea. No ear pain, sore throat, or fever. She has been taking Tylenol  and some sinus medications.       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 Has 2 peptic ulcers with history of GI bleed, no blood transfusion history   Headache    sinus    Hemorrhoids    Hypertension    Irritable bowel disease    Personal history of radiation therapy 2016   mammosite    Current Outpatient Medications  Medication Sig Dispense Refill   albuterol  (PROAIR  HFA) 108 (90 Base) MCG/ACT inhaler Inhale 1-2 puffs into the lungs every 4 (four) hours as needed for wheezing or shortness of breath. 54 g 3   amLODipine  (NORVASC ) 5 MG tablet Take 1 tablet (5 mg total) by mouth daily. 90 tablet 1   atorvastatin  (LIPITOR) 40 MG tablet TAKE 1 TABLET BY MOUTH DAILY AT 6 PM. 90 tablet 1   Budeson-Glycopyrrol-Formoterol  (BREZTRI  AEROSPHERE) 160-9-4.8 MCG/ACT AERO Inhale 2 puffs into the lungs 2 (two) times daily. 10.7 g 11   buPROPion  (WELLBUTRIN  XL) 300 MG 24 hr tablet Take 1 tablet (300 mg total) by mouth daily. 90 tablet 1   celecoxib  (CELEBREX ) 100 MG capsule Take 1 capsule (100 mg total) by mouth 2 (two) times daily. (Patient not taking: Reported on 09/26/2021) 60 capsule 3   Cholecalciferol  (VITAMIN D3) 1000 units CAPS Take 1,000 Units by mouth daily.     clopidogrel  (PLAVIX ) 75 MG tablet Take 1 tablet (75 mg total) by mouth daily. 90 tablet 3   ondansetron  (ZOFRAN -ODT) 4 MG disintegrating tablet Take 1 tablet (4 mg total) by mouth every 8 (eight) hours as needed. 20 tablet 0   predniSONE  (DELTASONE ) 50 MG tablet Take 1 tablet (50 mg total) by mouth daily with  breakfast. 5 tablet 0   Spacer/Aero-Holding Chambers DEVI 1 each by Does not apply route daily. 1 each 0   telmisartan  (MICARDIS ) 80 MG tablet Take 1 tablet (80 mg total) by mouth daily. 90 tablet 1   traZODone  (DESYREL ) 150 MG tablet TAKE 1 TABLET (150 MG TOTAL) BY MOUTH AT BEDTIME AS NEEDED FOR SLEEP. 30 tablet 0   Current Facility-Administered Medications  Medication Dose Route Frequency Provider Last Rate Last Admin   albuterol  (PROVENTIL ) (2.5 MG/3ML) 0.083% nebulizer solution 2.5 mg  2.5 mg Nebulization Once Johnson, Megan P, DO        Allergies[1]  Family History  Problem Relation Age of  Onset   Colon cancer Father        age 49   Anemia Father    Diabetes Father    Hypertension Father    Diverticulosis Mother    COPD Mother    Hypertension Brother    Alzheimer's disease Paternal Grandfather     Social History   Socioeconomic History   Marital status: Single    Spouse name: Not on file   Number of children: Not on file   Years of education: Not on file   Highest education level: Not on file  Occupational History   Not on file  Tobacco Use   Smoking status: Former    Current packs/day: 0.25    Average packs/day: 0.3 packs/day for 44.0 years (11.0 ttl pk-yrs)    Types: Cigarettes   Smokeless tobacco: Never  Vaping Use   Vaping status: Never Used  Substance and Sexual Activity   Alcohol use: No    Alcohol/week: 0.0 standard drinks of alcohol   Drug use: No   Sexual activity: Never  Other Topics Concern   Not on file  Social History Narrative   Not on file   Social Drivers of Health   Tobacco Use: Medium Risk (12/17/2021)   Patient History    Smoking Tobacco Use: Former    Smokeless Tobacco Use: Never    Passive Exposure: Not on Actuary Strain: Not on file  Food Insecurity: Not on file  Transportation Needs: Not on file  Physical Activity: Not on file  Stress: Not on file  Social Connections: Not on file  Intimate Partner Violence: Not on file  Depression (PHQ2-9): High Risk (12/17/2021)   Depression (PHQ2-9)    PHQ-2 Score: 11  Alcohol Screen: Not on file  Housing: Not on file  Utilities: Not on file  Health Literacy: Not on file     Constitutional: Pt reports headache, chills. Denies fever, malaise, fatigue, or abrupt weight changes.  HEENT: Pt reports runny nose, nasal congestion. Denies eye pain, eye redness, ear pain, ringing in the ears, wax buildup, bloody nose, or sore throat. Respiratory: Pt reports cough and shortness of breath. Denies difficulty breathing.   Cardiovascular: Denies chest pain, chest tightness,  palpitations or swelling in the hands or feet.  Gastrointestinal: Pt reports nausea. Denies abdominal pain, bloating, constipation, diarrhea or blood in the stool.  Musculoskeletal: Patient reports chronic joint pain.  Denies decrease in range of motion, difficulty with gait, muscle pain or joint swelling.    No other specific complaints in a complete review of systems (except as listed in HPI above).  Objective   BP 138/88 (BP Location: Left Arm, Patient Position: Sitting)   Pulse 79   Ht 5' 2 (1.575 m)   Wt 205 lb 9.6 oz (93.3 kg)   SpO2 96%  BMI 37.60 kg/m   Wt Readings from Last 3 Encounters:  12/17/21 197 lb 9.6 oz (89.6 kg)  09/25/21 250 lb (113.4 kg)  05/21/21 209 lb 3.2 oz (94.9 kg)    General: Appears her stated age, obese, in NAD. HEENT: Head: normal shape and size, no sinus tenderness noted; Eyes: sclera white, no icterus, conjunctiva pink, PERRLA and EOMs intact; Ears: Tm's gray and intact, normal light reflex; Nose: mucosa pink and moist, septum midline; Throat/Mouth: Teeth present, mucosa pink and moist, no exudate, lesions or ulcerations noted.  Neck: No adenopathy noted. Cardiovascular: Normal rate and rhythm. S1,S2 noted.  No murmur, rubs or gallops noted.  Pulmonary/Chest: Normal effort and positive vesicular breath sounds with scattered rhonchi throughout. No respiratory distress. No wheezes, rales noted.  Musculoskeletal:  No difficulty with gait.  Neurological: Alert and oriented.   BMET    Component Value Date/Time   NA 143 12/17/2021 1146   NA 139 06/19/2014 1430   K 2.9 (L) 12/17/2021 1146   K 3.1 (L) 06/19/2014 1430   CL 97 12/17/2021 1146   CL 104 06/19/2014 1430   CO2 28 12/17/2021 1146   CO2 27 06/19/2014 1430   GLUCOSE 88 12/17/2021 1146   GLUCOSE 91 09/25/2021 2207   GLUCOSE 99 06/19/2014 1430   BUN 6 (L) 12/17/2021 1146   BUN 4 (L) 06/19/2014 1430   CREATININE 0.84 12/17/2021 1146   CREATININE 0.73 06/19/2014 1430   CALCIUM  9.4  12/17/2021 1146   CALCIUM  8.2 (L) 06/19/2014 1430   GFRNONAA >60 09/25/2021 2207   GFRNONAA >60 06/19/2014 1430   GFRNONAA >60 08/07/2012 1024   GFRAA 94 07/25/2020 1022   GFRAA >60 06/19/2014 1430   GFRAA >60 08/07/2012 1024    Lipid Panel     Component Value Date/Time   CHOL 161 12/17/2021 1146   TRIG 126 12/17/2021 1146   HDL 78 12/17/2021 1146   CHOLHDL 3.8 11/02/2019 1318   VLDL 24 11/02/2019 1318   LDLCALC 61 12/17/2021 1146    CBC    Component Value Date/Time   WBC 12.0 (H) 12/17/2021 1146   WBC 5.5 09/25/2021 2207   RBC 4.66 12/17/2021 1146   RBC 3.96 09/25/2021 2207   HGB 13.1 12/17/2021 1146   HCT 40.3 12/17/2021 1146   PLT 299 12/17/2021 1146   MCV 87 12/17/2021 1146   MCV 92 06/19/2014 1430   MCH 28.1 12/17/2021 1146   MCH 28.3 09/25/2021 2207   MCHC 32.5 12/17/2021 1146   MCHC 31.8 09/25/2021 2207   RDW 14.6 12/17/2021 1146   RDW 13.3 06/19/2014 1430   LYMPHSABS 2.8 12/17/2021 1146   MONOABS 0.6 09/25/2021 2207   EOSABS 0.1 12/17/2021 1146   BASOSABS 0.1 12/17/2021 1146    Hgb A1C Lab Results  Component Value Date   HGBA1C 5.7 03/12/2021       Assessment and Plan    Assessment and Plan    COPD with acute exacerbation Acute exacerbation with increased dyspnea and productive cough. No current use of Breztri  or albuterol  inhaler. Smoking cessation three years ago. Symptoms exacerbated by weather changes. - Prescribed azithromycin  (Z-Pak): 500 mg on day one, then 250 mg daily for four days. - Refilled albuterol  inhaler: 1-2 puffs every four hours as needed. - Prescribed promethazine  DM for cough and nausea digitation caution given.  - She did not have any wheezing on exam and given the fact that she was prediabetic for years ago and has not had this rechecked, I  am hesitant to give her prednisone  which will increase her sugar levels in the case that she has developed diabetes.      Follow-up with your PCP as previously scheduled  Meagan Laura, NP     [1]  Allergies Allergen Reactions   Acetaminophen  Nausea And Vomiting   Aspirin      Bleeding ulcers   Oxycodone -Acetaminophen  Nausea Only   Tramadol Nausea And Vomiting   "

## 2024-09-28 NOTE — Telephone Encounter (Signed)
 FYI Only or Action Required?: FYI only for provider: appointment scheduled on this morning.  Patient was last seen in primary care on 12/17/2021 by Vicci Duwaine SQUIBB, DO.  Called Nurse Triage reporting Cough.  Symptoms began a week ago.  Interventions attempted: Nothing.  Symptoms are: gradually worsening.  Triage Disposition: See HCP Within 4 Hours (Or PCP Triage)  Patient/caregiver understands and will follow disposition?: Yes                            Reason for Triage: runny nose, hard time breathing, head clogged, coughing phlegm yellow/green    Reason for Disposition  [1] MILD difficulty breathing (e.g., minimal/no SOB at rest, SOB with walking, pulse < 100) AND [2] still present when not coughing  Answer Assessment - Initial Assessment Questions 1. ONSET: When did the cough begin?      1-1.5 weeks - started as head cold 2. SEVERITY: How bad is the cough today?      moderate 3. SPUTUM: Describe the color of your sputum (e.g., none, dry cough; clear, white, yellow, green)     Yellow green 4. HEMOPTYSIS: Are you coughing up any blood? If Yes, ask: How much? (e.g., flecks, streaks, tablespoons, etc.)     no 5. DIFFICULTY BREATHING: Are you having difficulty breathing? If Yes, ask: How bad is it? (e.g., mild, moderate, severe)      mild 6. FEVER: Do you have a fever? If Yes, ask: What is your temperature, how was it measured, and when did it start?     no 7. CARDIAC HISTORY: Do you have any history of heart disease? (e.g., heart attack, congestive heart failure)      strokes 8. LUNG HISTORY: Do you have any history of lung disease?  (e.g., pulmonary embolus, asthma, emphysema)     copd  10. OTHER SYMPTOMS: Do you have any other symptoms? (e.g., runny nose, wheezing, chest pain)       Sinus congestion  Protocols used: Cough - Acute Productive-A-AH

## 2024-09-28 NOTE — Patient Instructions (Signed)
 Chronic Obstructive Pulmonary Disease Exacerbation  Chronic obstructive pulmonary disease (COPD) is a long-term (chronic) lung problem. When you have COPD, it can feel harder to breathe in or out. COPD exacerbation is a flare-up of symptoms when breathing gets worse and more treatment may be needed. Without treatment, flare-ups can be life-threatening. If they happen often, your lungs can become more damaged. What are the causes? Not taking your usual COPD medicines as told by your health care provider. A cold or the flu, which can cause infection in your lungs. Being exposed to things that make your breathing worse, such as: Smoke. Air pollution. Fumes. Dust. Allergies. Weather changes. What are the signs or symptoms? Symptoms do not get better or get worse even if you take your medicines as told by your provider. Symptoms may include: More shortness of breath. You may only be able to speak one or two words at a time. More coughing or mucus from your lungs. More wheezing or chest tightness. Being more tired and having less energy. Confusion. How is this diagnosed? This condition is diagnosed based on: Symptoms that get worse. Your medical history. A physical exam. You may also have tests, including: A chest X-ray. Blood or mucus tests. How is this treated? You may be able to stay home or you may need to go to the hospital. Treatment may include: Taking medicines. These may include: Inhalers. These have medicines in them that you breathe in. These may be more of what you already take or they may be new. Steroids. These reduce inflammation in the airways. These may be inhaled, taken by mouth, or given in an IV. Antibiotics. These treat infection. Using oxygen. Using a device to help you clear mucus. Follow these instructions at home: Medicines Take your medicines only as told by your provider. If you were given antibiotics or steroids, take them as told by your provider. Do  not stop taking them even if you start to feel better. Lifestyle Several times a day, wash your hands with soap and water for at least 20 seconds. If you cannot use soap and water, use hand sanitizer. This may help keep you from getting an infection. Avoid being around crowds or people who are sick. Do not smoke or use any products that contain nicotine or tobacco. If you need help quitting, ask your provider. Return to your normal activities when your provider says that it's safe. Use breathing methods to control your stress and catch your breath. How is this prevented? Follow your COPD action plan. The action plan tells you what to do if you're feeling good and what to do when you start feeling worse. Discuss the plan often with your provider. Make sure you get all the shots, also called vaccines, that your provider recommends. Ask your provider about a flu shot and a pneumonia shot. Use oxygen therapy if told by your provider. If you need home oxygen therapy, ask your provider how often to check your oxygen level with a device called an oximeter. Keep all follow-up visits to review your COPD action plan. Your provider will want to check on your condition often to keep you healthy and out of the hospital. Contact a health care provider if: Your COPD symptoms get worse. You have a fever or chills. You have trouble doing daily activities. You have trouble breathing even when you are resting. Get help right away if: You are short of breath and cannot: Talk in full sentences. Do normal activities. You have chest  pain. You feel confused. These symptoms may be an emergency. Call 911 right away. Do not wait to see if the symptoms will go away. Do not drive yourself to the hospital. This information is not intended to replace advice given to you by your health care provider. Make sure you discuss any questions you have with your health care provider. Document Revised: 06/02/2023 Document  Reviewed: 11/15/2022 Elsevier Patient Education  2024 [employer].
